# Patient Record
Sex: Female | Born: 1935 | Race: White | Hispanic: No | Smoking: Never smoker
Health system: Southern US, Community
[De-identification: ages and names within clinical notes are randomized; demographics above are authoritative.]

## PROBLEM LIST (undated history)

## (undated) DIAGNOSIS — IMO0001 Reserved for inherently not codable concepts without codable children: Secondary | ICD-10-CM

## (undated) DIAGNOSIS — H269 Unspecified cataract: Secondary | ICD-10-CM

## (undated) DIAGNOSIS — I82409 Acute embolism and thrombosis of unspecified deep veins of unspecified lower extremity: Secondary | ICD-10-CM

## (undated) DIAGNOSIS — N390 Urinary tract infection, site not specified: Secondary | ICD-10-CM

## (undated) DIAGNOSIS — Z96649 Presence of unspecified artificial hip joint: Secondary | ICD-10-CM

## (undated) DIAGNOSIS — M1990 Unspecified osteoarthritis, unspecified site: Secondary | ICD-10-CM

## (undated) DIAGNOSIS — J342 Deviated nasal septum: Secondary | ICD-10-CM

## (undated) DIAGNOSIS — H538 Other visual disturbances: Secondary | ICD-10-CM

## (undated) DIAGNOSIS — M47812 Spondylosis without myelopathy or radiculopathy, cervical region: Secondary | ICD-10-CM

## (undated) DIAGNOSIS — J309 Allergic rhinitis, unspecified: Secondary | ICD-10-CM

## (undated) DIAGNOSIS — M545 Low back pain: Secondary | ICD-10-CM

## (undated) DIAGNOSIS — I679 Cerebrovascular disease, unspecified: Secondary | ICD-10-CM

## (undated) DIAGNOSIS — E039 Hypothyroidism, unspecified: Secondary | ICD-10-CM

## (undated) DIAGNOSIS — H6980 Other specified disorders of Eustachian tube, unspecified ear: Secondary | ICD-10-CM

## (undated) DIAGNOSIS — I639 Cerebral infarction, unspecified: Secondary | ICD-10-CM

## (undated) DIAGNOSIS — F418 Other specified anxiety disorders: Secondary | ICD-10-CM

## (undated) DIAGNOSIS — E785 Hyperlipidemia, unspecified: Secondary | ICD-10-CM

## (undated) DIAGNOSIS — J329 Chronic sinusitis, unspecified: Secondary | ICD-10-CM

## (undated) DIAGNOSIS — Z87898 Personal history of other specified conditions: Secondary | ICD-10-CM

## (undated) DIAGNOSIS — H6990 Unspecified Eustachian tube disorder, unspecified ear: Secondary | ICD-10-CM

## (undated) DIAGNOSIS — E871 Hypo-osmolality and hyponatremia: Secondary | ICD-10-CM

## (undated) DIAGNOSIS — F419 Anxiety disorder, unspecified: Secondary | ICD-10-CM

## (undated) DIAGNOSIS — I10 Essential (primary) hypertension: Secondary | ICD-10-CM

## (undated) DIAGNOSIS — R3911 Hesitancy of micturition: Secondary | ICD-10-CM

## (undated) DIAGNOSIS — I82412 Acute embolism and thrombosis of left femoral vein: Secondary | ICD-10-CM

## (undated) DIAGNOSIS — G319 Degenerative disease of nervous system, unspecified: Secondary | ICD-10-CM

## (undated) DIAGNOSIS — T84029A Dislocation of unspecified internal joint prosthesis, initial encounter: Secondary | ICD-10-CM

## (undated) DIAGNOSIS — M810 Age-related osteoporosis without current pathological fracture: Secondary | ICD-10-CM

## (undated) DIAGNOSIS — L299 Pruritus, unspecified: Secondary | ICD-10-CM

## (undated) HISTORY — DX: Spondylosis without myelopathy or radiculopathy, cervical region: M47.812

## (undated) HISTORY — DX: Hypothyroidism, unspecified: E03.9

## (undated) HISTORY — DX: Other visual disturbances: H53.8

## (undated) HISTORY — DX: Urinary tract infection, site not specified: N39.0

## (undated) HISTORY — DX: Anxiety disorder, unspecified: F41.9

## (undated) HISTORY — DX: Deviated nasal septum: J34.2

## (undated) HISTORY — DX: Cerebral infarction, unspecified: I63.9

## (undated) HISTORY — DX: Acute embolism and thrombosis of unspecified deep veins of unspecified lower extremity: I82.409

## (undated) HISTORY — DX: Other specified anxiety disorders: F41.8

## (undated) HISTORY — DX: Hypo-osmolality and hyponatremia: E87.1

## (undated) HISTORY — DX: Low back pain: M54.5

## (undated) HISTORY — DX: Degenerative disease of nervous system, unspecified: G31.9

## (undated) HISTORY — DX: Chronic sinusitis, unspecified: J32.9

## (undated) HISTORY — DX: Allergic rhinitis, unspecified: J30.9

## (undated) HISTORY — DX: Dislocation of unspecified internal joint prosthesis, initial encounter: T84.029A

## (undated) HISTORY — DX: Unspecified cataract: H26.9

## (undated) HISTORY — DX: Hesitancy of micturition: R39.11

## (undated) HISTORY — DX: Hyperlipidemia, unspecified: E78.5

## (undated) HISTORY — DX: Personal history of other specified conditions: Z87.898

## (undated) HISTORY — DX: Reserved for inherently not codable concepts without codable children: IMO0001

## (undated) HISTORY — DX: Unspecified eustachian tube disorder, unspecified ear: H69.90

## (undated) HISTORY — PX: TONSILLECTOMY: SUR1361

## (undated) HISTORY — PX: TOTAL HIP ARTHROPLASTY: SHX124

## (undated) HISTORY — DX: Age-related osteoporosis without current pathological fracture: M81.0

## (undated) HISTORY — DX: Presence of unspecified artificial hip joint: Z96.649

## (undated) HISTORY — DX: Acute embolism and thrombosis of left femoral vein: I82.412

## (undated) HISTORY — DX: Pruritus, unspecified: L29.9

## (undated) HISTORY — DX: Unspecified osteoarthritis, unspecified site: M19.90

## (undated) HISTORY — PX: JOINT REPLACEMENT: SHX530

## (undated) HISTORY — DX: Essential (primary) hypertension: I10

## (undated) HISTORY — DX: Cerebrovascular disease, unspecified: I67.9

## (undated) HISTORY — PX: OTHER SURGICAL HISTORY: SHX169

## (undated) HISTORY — DX: Other specified disorders of Eustachian tube, unspecified ear: H69.80

---

## 1998-09-25 ENCOUNTER — Encounter: Payer: Self-pay | Admitting: Internal Medicine

## 1998-09-25 ENCOUNTER — Ambulatory Visit (HOSPITAL_COMMUNITY): Admission: RE | Admit: 1998-09-25 | Discharge: 1998-09-25 | Payer: Self-pay | Admitting: Internal Medicine

## 1998-10-13 ENCOUNTER — Other Ambulatory Visit: Admission: RE | Admit: 1998-10-13 | Discharge: 1998-10-13 | Payer: Self-pay | Admitting: Internal Medicine

## 1999-07-19 ENCOUNTER — Ambulatory Visit (HOSPITAL_COMMUNITY): Admission: RE | Admit: 1999-07-19 | Discharge: 1999-07-19 | Payer: Self-pay | Admitting: *Deleted

## 1999-10-29 ENCOUNTER — Other Ambulatory Visit: Admission: RE | Admit: 1999-10-29 | Discharge: 1999-10-29 | Payer: Self-pay | Admitting: Internal Medicine

## 1999-11-15 ENCOUNTER — Encounter: Payer: Self-pay | Admitting: Internal Medicine

## 1999-11-15 ENCOUNTER — Ambulatory Visit (HOSPITAL_COMMUNITY): Admission: RE | Admit: 1999-11-15 | Discharge: 1999-11-15 | Payer: Self-pay

## 2000-11-02 ENCOUNTER — Other Ambulatory Visit: Admission: RE | Admit: 2000-11-02 | Discharge: 2000-11-02 | Payer: Self-pay | Admitting: Internal Medicine

## 2000-11-29 ENCOUNTER — Encounter: Payer: Self-pay | Admitting: Internal Medicine

## 2000-11-29 ENCOUNTER — Ambulatory Visit (HOSPITAL_COMMUNITY): Admission: RE | Admit: 2000-11-29 | Discharge: 2000-11-29 | Payer: Self-pay | Admitting: Internal Medicine

## 2001-04-05 ENCOUNTER — Encounter: Payer: Self-pay | Admitting: Orthopedic Surgery

## 2001-04-12 ENCOUNTER — Inpatient Hospital Stay (HOSPITAL_COMMUNITY): Admission: RE | Admit: 2001-04-12 | Discharge: 2001-04-16 | Payer: Self-pay | Admitting: Orthopedic Surgery

## 2001-04-12 ENCOUNTER — Encounter: Payer: Self-pay | Admitting: Orthopedic Surgery

## 2001-04-16 ENCOUNTER — Inpatient Hospital Stay (HOSPITAL_COMMUNITY)
Admission: RE | Admit: 2001-04-16 | Discharge: 2001-04-24 | Payer: Self-pay | Admitting: Physical Medicine & Rehabilitation

## 2001-11-05 ENCOUNTER — Other Ambulatory Visit: Admission: RE | Admit: 2001-11-05 | Discharge: 2001-11-05 | Payer: Self-pay | Admitting: Internal Medicine

## 2001-12-06 ENCOUNTER — Ambulatory Visit (HOSPITAL_COMMUNITY): Admission: RE | Admit: 2001-12-06 | Discharge: 2001-12-06 | Payer: Self-pay | Admitting: Internal Medicine

## 2001-12-06 ENCOUNTER — Encounter: Payer: Self-pay | Admitting: Internal Medicine

## 2002-06-06 ENCOUNTER — Encounter: Payer: Self-pay | Admitting: Orthopedic Surgery

## 2002-06-10 ENCOUNTER — Encounter: Payer: Self-pay | Admitting: Orthopedic Surgery

## 2002-06-10 ENCOUNTER — Inpatient Hospital Stay (HOSPITAL_COMMUNITY): Admission: RE | Admit: 2002-06-10 | Discharge: 2002-06-13 | Payer: Self-pay | Admitting: Orthopedic Surgery

## 2002-06-13 ENCOUNTER — Inpatient Hospital Stay (HOSPITAL_COMMUNITY)
Admission: RE | Admit: 2002-06-13 | Discharge: 2002-06-19 | Payer: Self-pay | Admitting: Physical Medicine & Rehabilitation

## 2002-11-29 ENCOUNTER — Other Ambulatory Visit: Admission: RE | Admit: 2002-11-29 | Discharge: 2002-11-29 | Payer: Self-pay | Admitting: Internal Medicine

## 2002-12-16 ENCOUNTER — Ambulatory Visit (HOSPITAL_COMMUNITY): Admission: RE | Admit: 2002-12-16 | Discharge: 2002-12-16 | Payer: Self-pay | Admitting: Internal Medicine

## 2002-12-16 ENCOUNTER — Encounter: Payer: Self-pay | Admitting: Internal Medicine

## 2003-08-21 ENCOUNTER — Encounter: Admission: RE | Admit: 2003-08-21 | Discharge: 2003-08-21 | Payer: Self-pay | Admitting: Orthopedic Surgery

## 2003-12-18 ENCOUNTER — Encounter: Admission: RE | Admit: 2003-12-18 | Discharge: 2003-12-18 | Payer: Self-pay | Admitting: Internal Medicine

## 2004-01-06 ENCOUNTER — Ambulatory Visit (HOSPITAL_COMMUNITY): Admission: RE | Admit: 2004-01-06 | Discharge: 2004-01-06 | Payer: Self-pay | Admitting: Internal Medicine

## 2004-11-16 ENCOUNTER — Ambulatory Visit: Payer: Self-pay | Admitting: Internal Medicine

## 2004-11-27 ENCOUNTER — Observation Stay (HOSPITAL_COMMUNITY): Admission: EM | Admit: 2004-11-27 | Discharge: 2004-11-28 | Payer: Self-pay | Admitting: Emergency Medicine

## 2005-01-06 ENCOUNTER — Ambulatory Visit (HOSPITAL_COMMUNITY): Admission: RE | Admit: 2005-01-06 | Discharge: 2005-01-06 | Payer: Self-pay | Admitting: Internal Medicine

## 2005-03-16 ENCOUNTER — Ambulatory Visit: Payer: Self-pay | Admitting: Internal Medicine

## 2005-04-15 ENCOUNTER — Ambulatory Visit: Payer: Self-pay | Admitting: Internal Medicine

## 2005-05-19 ENCOUNTER — Ambulatory Visit: Payer: Self-pay | Admitting: Internal Medicine

## 2005-05-20 ENCOUNTER — Ambulatory Visit: Payer: Self-pay | Admitting: Internal Medicine

## 2005-05-23 ENCOUNTER — Ambulatory Visit: Payer: Self-pay | Admitting: Internal Medicine

## 2005-05-26 ENCOUNTER — Ambulatory Visit: Payer: Self-pay | Admitting: Internal Medicine

## 2005-05-30 ENCOUNTER — Ambulatory Visit: Payer: Self-pay | Admitting: Internal Medicine

## 2005-06-06 ENCOUNTER — Ambulatory Visit: Payer: Self-pay | Admitting: Internal Medicine

## 2005-06-09 ENCOUNTER — Ambulatory Visit: Payer: Self-pay | Admitting: Internal Medicine

## 2005-06-14 ENCOUNTER — Ambulatory Visit: Payer: Self-pay | Admitting: Internal Medicine

## 2005-06-17 ENCOUNTER — Ambulatory Visit: Payer: Self-pay | Admitting: Internal Medicine

## 2005-06-21 ENCOUNTER — Ambulatory Visit: Payer: Self-pay | Admitting: Internal Medicine

## 2005-07-14 ENCOUNTER — Ambulatory Visit: Payer: Self-pay | Admitting: Internal Medicine

## 2005-07-18 ENCOUNTER — Ambulatory Visit: Payer: Self-pay | Admitting: Internal Medicine

## 2005-08-12 ENCOUNTER — Ambulatory Visit: Payer: Self-pay | Admitting: Internal Medicine

## 2005-11-15 ENCOUNTER — Ambulatory Visit: Payer: Self-pay | Admitting: Internal Medicine

## 2006-01-10 ENCOUNTER — Ambulatory Visit: Payer: Self-pay | Admitting: Internal Medicine

## 2006-01-19 ENCOUNTER — Ambulatory Visit (HOSPITAL_COMMUNITY): Admission: RE | Admit: 2006-01-19 | Discharge: 2006-01-19 | Payer: Self-pay | Admitting: Internal Medicine

## 2006-03-29 ENCOUNTER — Ambulatory Visit: Payer: Self-pay | Admitting: Internal Medicine

## 2006-04-10 ENCOUNTER — Ambulatory Visit: Payer: Self-pay | Admitting: Internal Medicine

## 2006-06-26 ENCOUNTER — Ambulatory Visit: Payer: Self-pay | Admitting: Pulmonary Disease

## 2006-07-14 ENCOUNTER — Encounter: Admission: RE | Admit: 2006-07-14 | Discharge: 2006-07-14 | Payer: Self-pay | Admitting: Internal Medicine

## 2006-07-18 ENCOUNTER — Ambulatory Visit: Payer: Self-pay | Admitting: Internal Medicine

## 2006-09-18 ENCOUNTER — Ambulatory Visit: Payer: Self-pay | Admitting: Internal Medicine

## 2006-11-10 ENCOUNTER — Emergency Department (HOSPITAL_COMMUNITY): Admission: EM | Admit: 2006-11-10 | Discharge: 2006-11-10 | Payer: Self-pay | Admitting: Emergency Medicine

## 2006-12-26 ENCOUNTER — Ambulatory Visit: Payer: Self-pay | Admitting: Internal Medicine

## 2007-01-22 ENCOUNTER — Ambulatory Visit (HOSPITAL_COMMUNITY): Admission: RE | Admit: 2007-01-22 | Discharge: 2007-01-22 | Payer: Self-pay | Admitting: Internal Medicine

## 2007-03-19 ENCOUNTER — Ambulatory Visit: Payer: Self-pay | Admitting: Internal Medicine

## 2007-03-23 ENCOUNTER — Ambulatory Visit (HOSPITAL_COMMUNITY): Admission: RE | Admit: 2007-03-23 | Discharge: 2007-03-23 | Payer: Self-pay | Admitting: *Deleted

## 2007-08-11 DIAGNOSIS — J329 Chronic sinusitis, unspecified: Secondary | ICD-10-CM | POA: Insufficient documentation

## 2007-08-11 DIAGNOSIS — H6980 Other specified disorders of Eustachian tube, unspecified ear: Secondary | ICD-10-CM

## 2007-08-11 DIAGNOSIS — J310 Chronic rhinitis: Secondary | ICD-10-CM | POA: Insufficient documentation

## 2007-08-11 DIAGNOSIS — J342 Deviated nasal septum: Secondary | ICD-10-CM

## 2007-08-14 ENCOUNTER — Ambulatory Visit: Payer: Self-pay | Admitting: Internal Medicine

## 2007-09-19 ENCOUNTER — Telehealth: Payer: Self-pay | Admitting: Internal Medicine

## 2007-09-26 ENCOUNTER — Telehealth (INDEPENDENT_AMBULATORY_CARE_PROVIDER_SITE_OTHER): Payer: Self-pay | Admitting: *Deleted

## 2007-09-30 IMAGING — CT CT HEAD W/O CM
1 of 2 series · 16 of 30 positions shown, 20 images · IV contrast (agent unspecified)
Comparison: None.

CLINICAL DATA: 85-year-old, fell.  
 HEAD CT WITHOUT CONTRAST:
TECHNIQUE: Contiguous axial images were obtained from the base of the skull through the vertex according to standard protocol without contrast.

[Series 2: head trauma 4.8 h47s · axial · 0.47mm/px · z∈[-91,+37]mm · 16 of 30 slices shown, 20 images]
[im 2/30  brain]
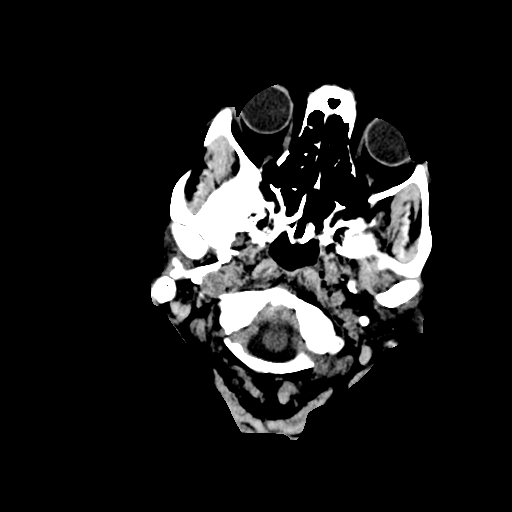
[im 2/30  bone]
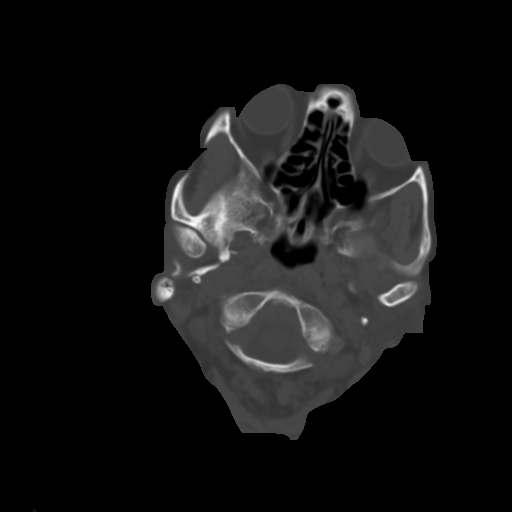
[im 3/30  brain]
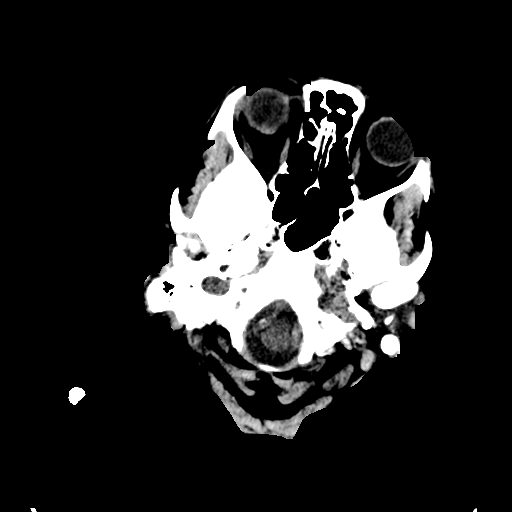
[im 5/30  brain]
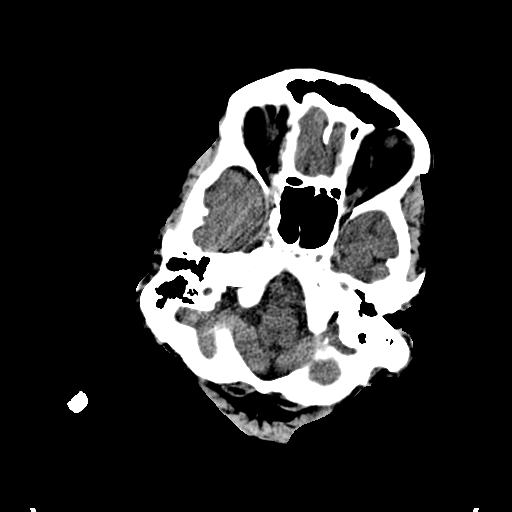
[im 8/30  brain]
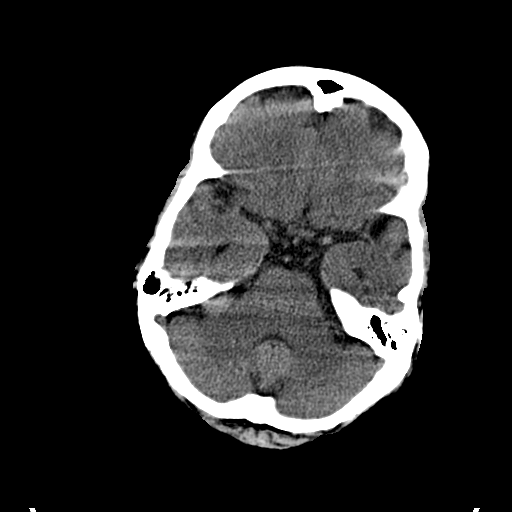
[im 9/30  brain]
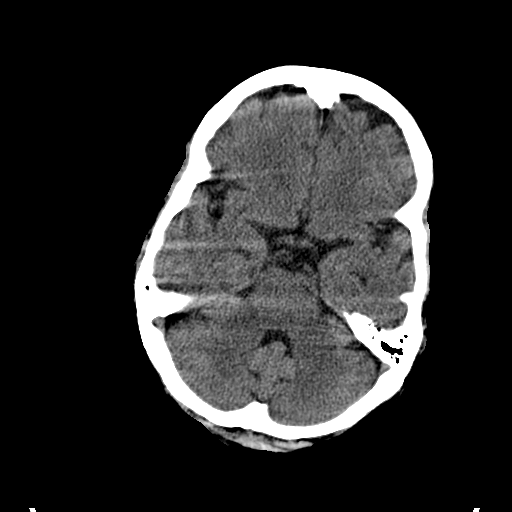
[im 9/30  bone]
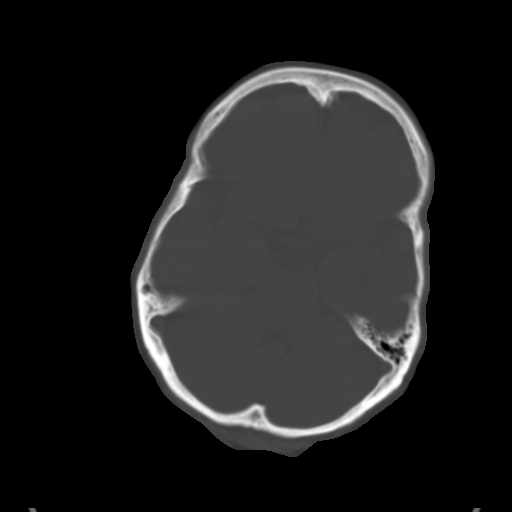
[im 11/30  brain]
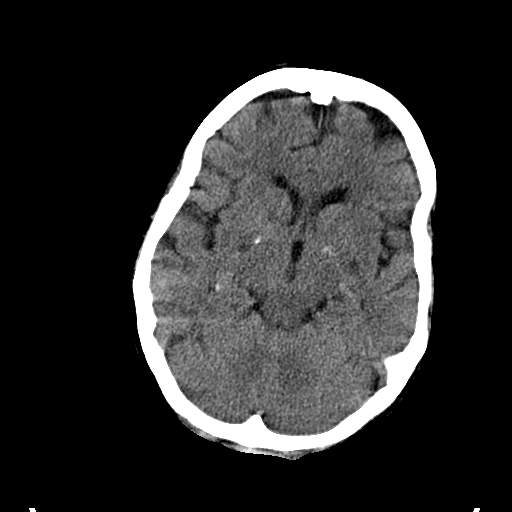
[im 12/30  brain]
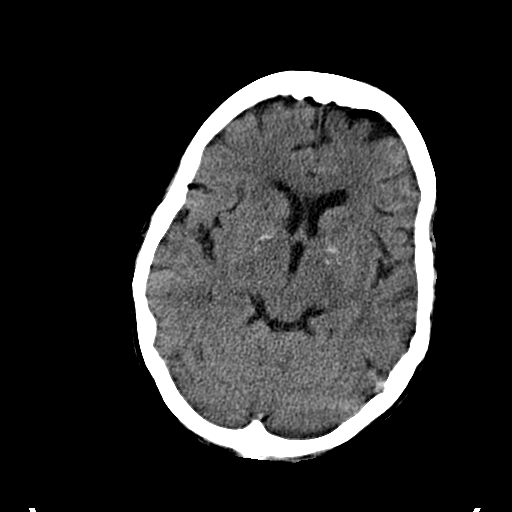
[im 14/30  brain]
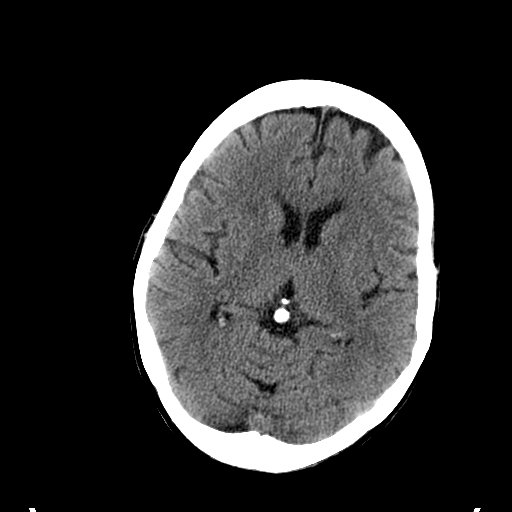
[im 16/30  brain]
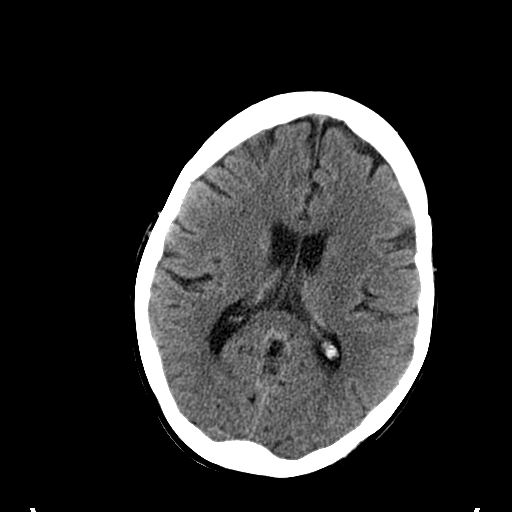
[im 16/30  bone]
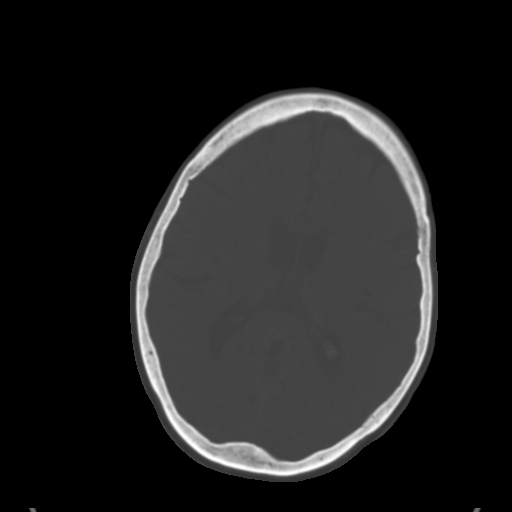
[im 18/30  brain]
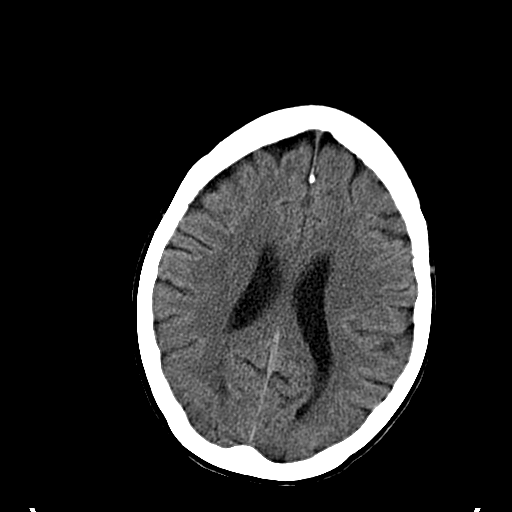
[im 19/30  brain]
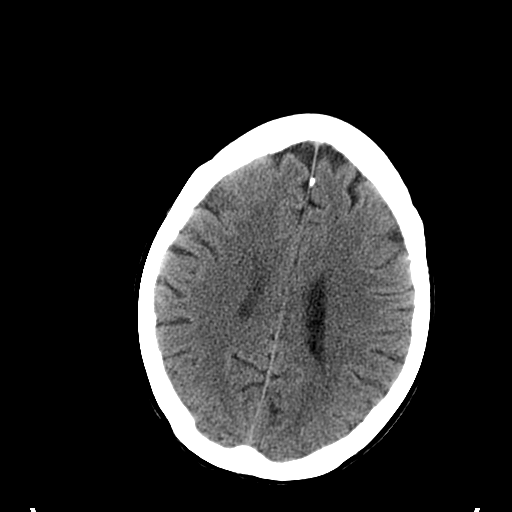
[im 21/30  brain]
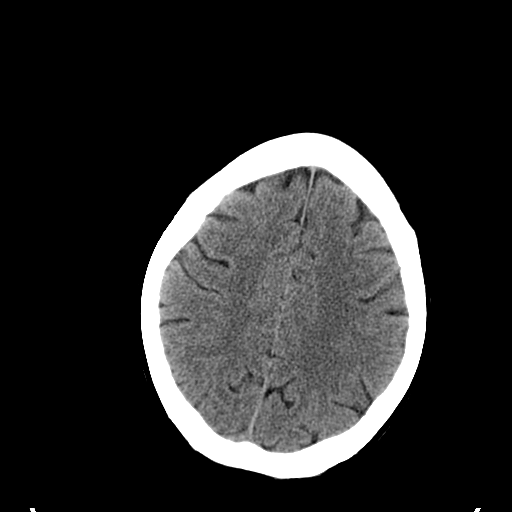
[im 22/30  brain]
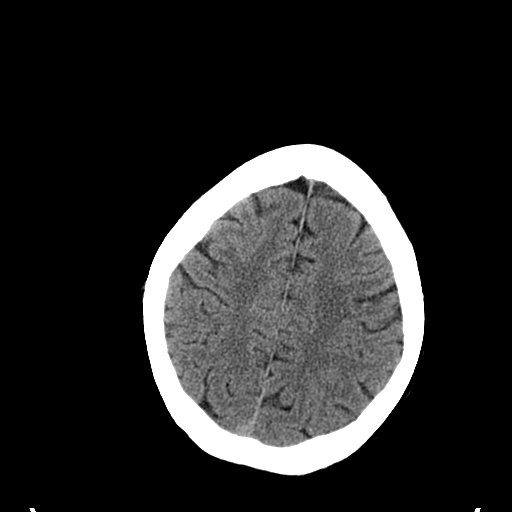
[im 22/30  bone]
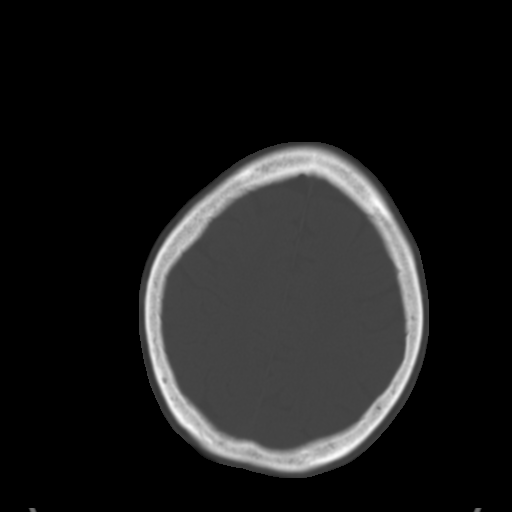
[im 25/30  brain]
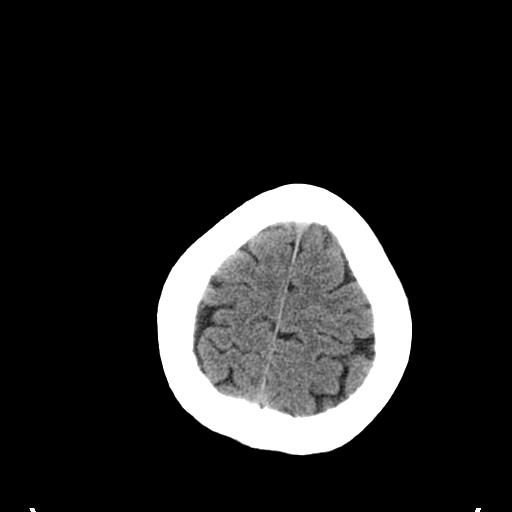
[im 27/30  brain]
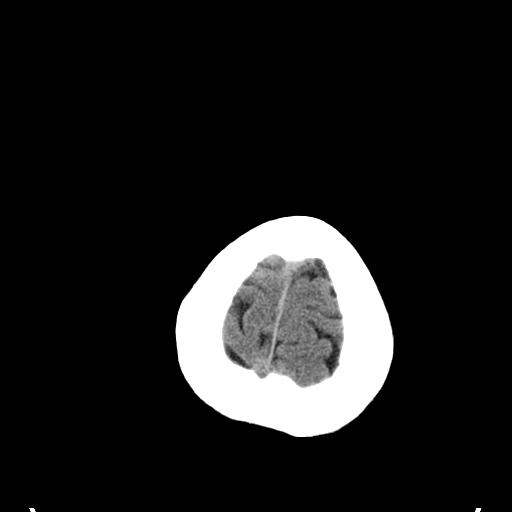
[im 28/30  brain]
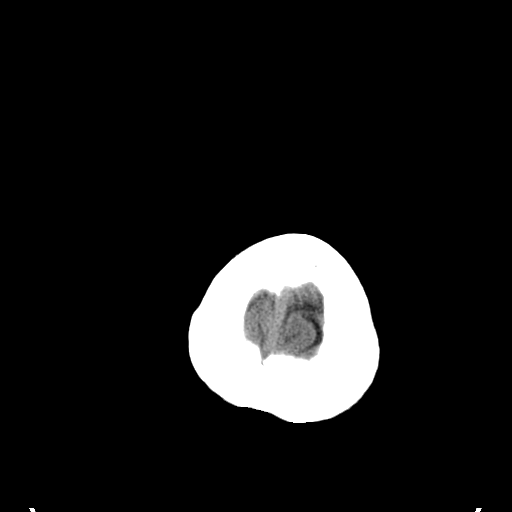

[16 of 30 positions shown; findings below may reference images not displayed]

FINDINGS: Intracranially, the ventricles are in the midline without mass effect or shift.  They are normal in size and configuration.  No extra-axial fluid collections are seen.  Benign appearing bilateral basal ganglia calcifications and remote lacunar type infarcts in the caudate areas.  No evidence for hemispheric infarction, intracranial hemorrhage, or subdural hematoma.  No intracranial mass lesions.  
 The bony calvarium is intact.  The visualized paranasal sinuses and mastoid air cells are clear.
IMPRESSION: No acute cardiopulmonary findings.

## 2007-09-30 IMAGING — CR DG HIP (WITH OR WITHOUT PELVIS) 2-3V*L*
2 series · 2 of 2 positions shown · non-contrast
Comparison: none

CLINICAL DATA: 85-year-old who fell.  Left hip dislocation.  
 LEFT HIP ? 2 VIEW:

[t pelvis a.p.]
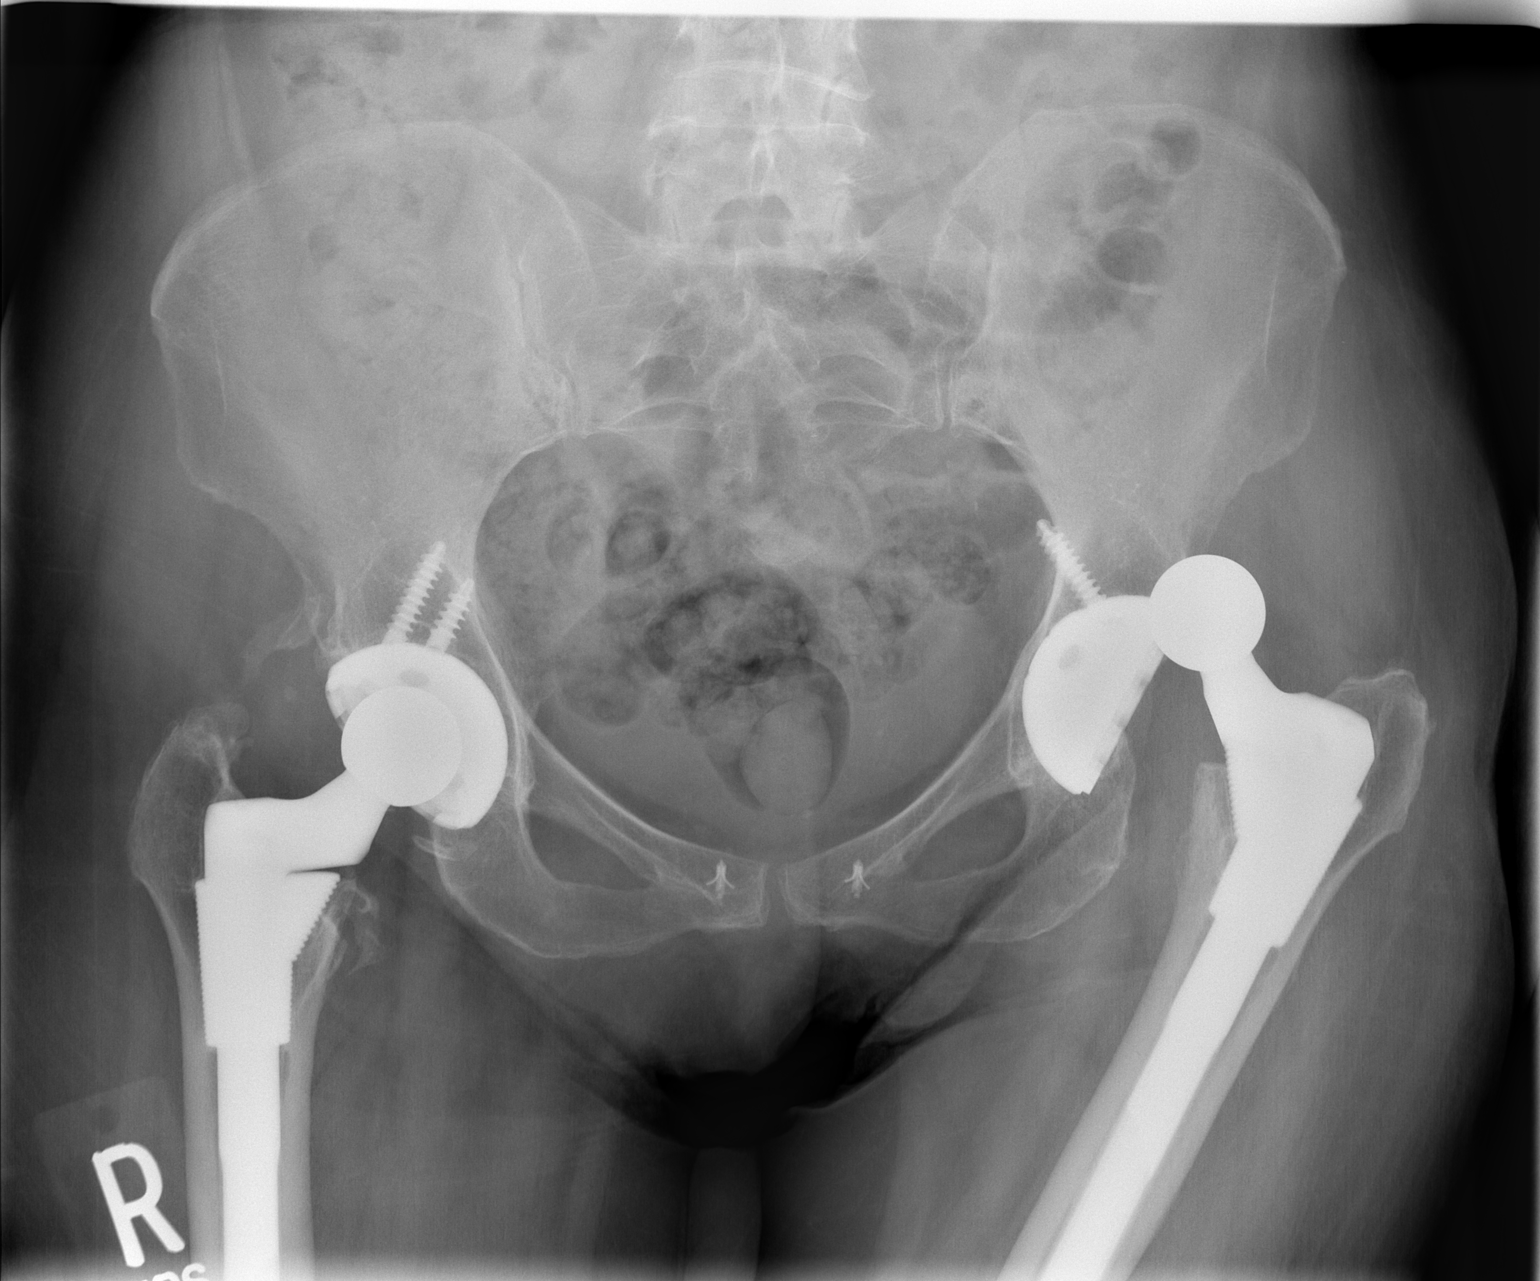

[t hip ap left]
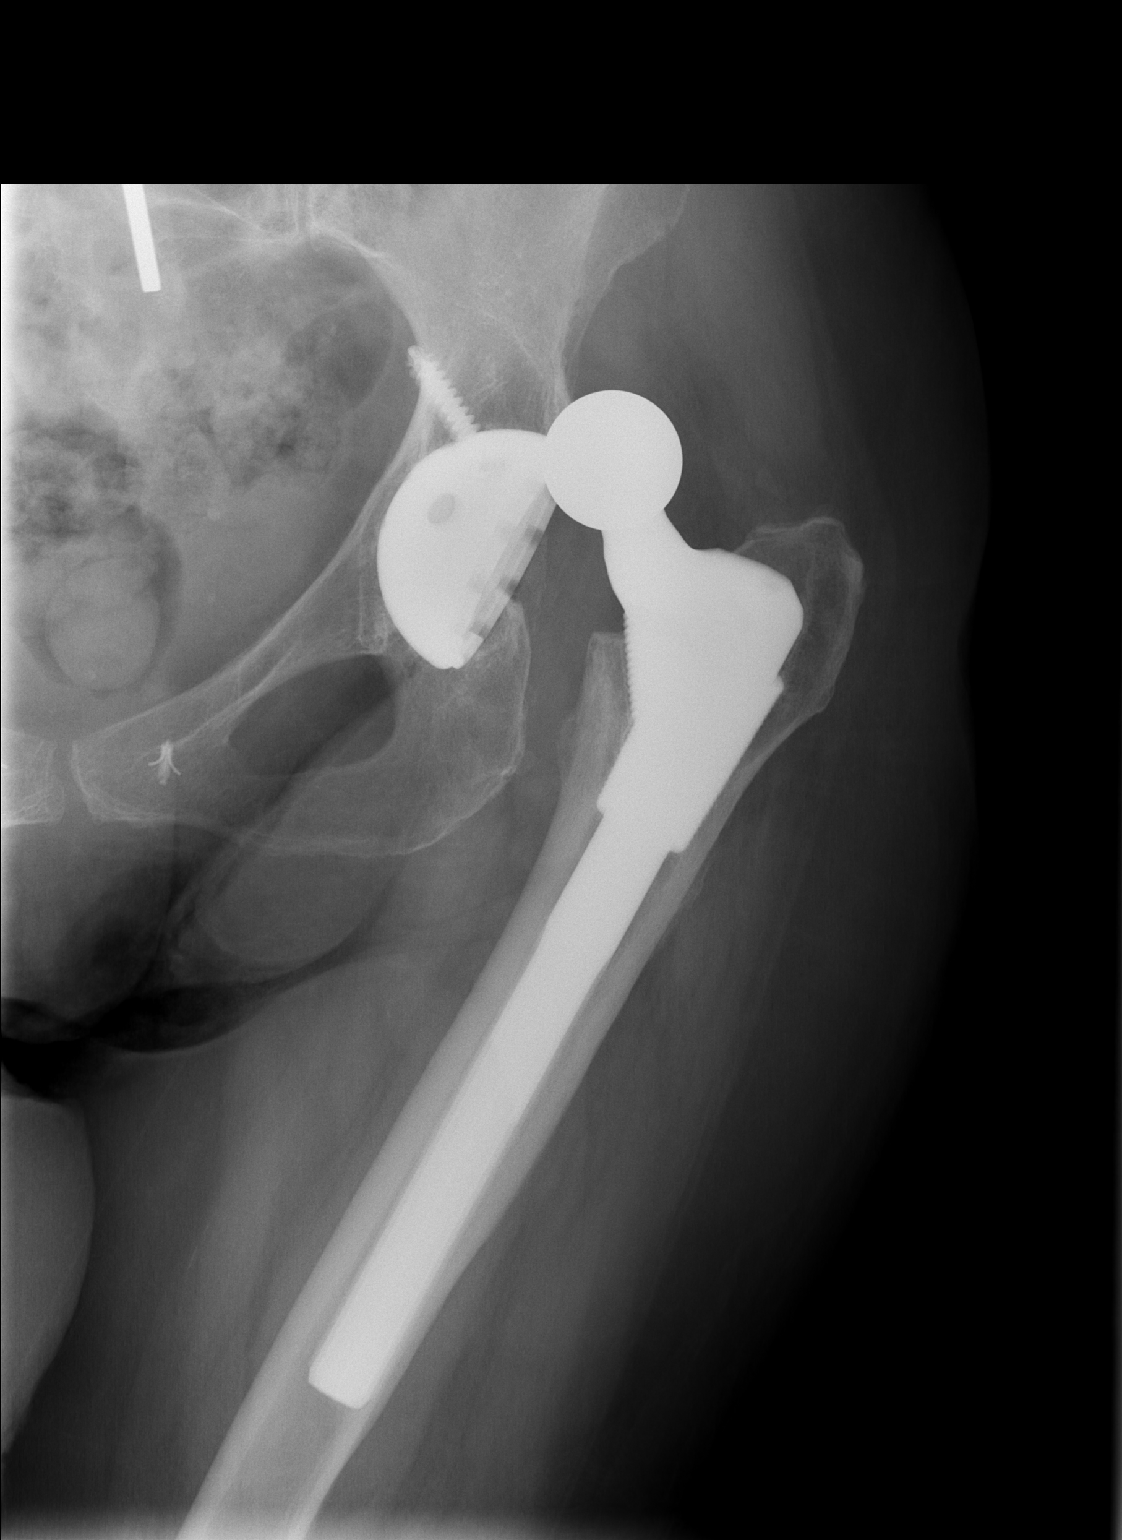

[2 of 2 positions shown; findings below may reference images not displayed]

FINDINGS: The left femoral prosthesis is dislocated superiorly and posteriorly.  No fractures are seen.
IMPRESSION: Left femoral prosthesis dislocation.

## 2007-12-11 ENCOUNTER — Encounter: Payer: Self-pay | Admitting: Internal Medicine

## 2008-02-05 ENCOUNTER — Encounter: Admission: RE | Admit: 2008-02-05 | Discharge: 2008-02-05 | Payer: Self-pay | Admitting: Internal Medicine

## 2008-02-21 ENCOUNTER — Encounter: Admission: RE | Admit: 2008-02-21 | Discharge: 2008-02-21 | Payer: Self-pay | Admitting: Internal Medicine

## 2008-02-21 ENCOUNTER — Encounter (INDEPENDENT_AMBULATORY_CARE_PROVIDER_SITE_OTHER): Payer: Self-pay | Admitting: Interventional Radiology

## 2008-02-21 ENCOUNTER — Other Ambulatory Visit: Admission: RE | Admit: 2008-02-21 | Discharge: 2008-02-21 | Payer: Self-pay | Admitting: Interventional Radiology

## 2008-03-20 ENCOUNTER — Ambulatory Visit (HOSPITAL_COMMUNITY): Admission: RE | Admit: 2008-03-20 | Discharge: 2008-03-20 | Payer: Self-pay | Admitting: Internal Medicine

## 2008-04-11 ENCOUNTER — Telehealth (INDEPENDENT_AMBULATORY_CARE_PROVIDER_SITE_OTHER): Payer: Self-pay | Admitting: *Deleted

## 2008-05-12 ENCOUNTER — Telehealth (INDEPENDENT_AMBULATORY_CARE_PROVIDER_SITE_OTHER): Payer: Self-pay | Admitting: *Deleted

## 2008-06-05 ENCOUNTER — Ambulatory Visit: Payer: Self-pay | Admitting: Internal Medicine

## 2008-07-11 ENCOUNTER — Telehealth (INDEPENDENT_AMBULATORY_CARE_PROVIDER_SITE_OTHER): Payer: Self-pay | Admitting: *Deleted

## 2008-08-26 ENCOUNTER — Telehealth (INDEPENDENT_AMBULATORY_CARE_PROVIDER_SITE_OTHER): Payer: Self-pay | Admitting: *Deleted

## 2008-09-15 ENCOUNTER — Telehealth (INDEPENDENT_AMBULATORY_CARE_PROVIDER_SITE_OTHER): Payer: Self-pay | Admitting: *Deleted

## 2009-03-31 ENCOUNTER — Ambulatory Visit (HOSPITAL_COMMUNITY): Admission: RE | Admit: 2009-03-31 | Discharge: 2009-03-31 | Payer: Self-pay | Admitting: Unknown Physician Specialty

## 2009-05-27 ENCOUNTER — Telehealth: Payer: Self-pay | Admitting: Internal Medicine

## 2009-06-05 ENCOUNTER — Ambulatory Visit: Payer: Self-pay | Admitting: Internal Medicine

## 2009-06-05 DIAGNOSIS — N390 Urinary tract infection, site not specified: Secondary | ICD-10-CM

## 2009-06-05 LAB — CONVERTED CEMR LAB
Specific Gravity, Urine: >=1.03 (ref 1.000–1.030)
Total Protein, Urine: 30 mg/dL
Urine Glucose: NEGATIVE mg/dL

## 2009-06-06 ENCOUNTER — Encounter: Payer: Self-pay | Admitting: Internal Medicine

## 2009-06-08 ENCOUNTER — Telehealth: Payer: Self-pay | Admitting: Internal Medicine

## 2009-07-17 HISTORY — PX: LOOP RECORDER IMPLANT: SHX5954

## 2009-12-14 ENCOUNTER — Telehealth (INDEPENDENT_AMBULATORY_CARE_PROVIDER_SITE_OTHER): Payer: Self-pay | Admitting: *Deleted

## 2010-04-09 ENCOUNTER — Ambulatory Visit (HOSPITAL_COMMUNITY): Admission: RE | Admit: 2010-04-09 | Discharge: 2010-04-09 | Payer: Self-pay | Admitting: Internal Medicine

## 2010-06-25 ENCOUNTER — Ambulatory Visit: Payer: Self-pay | Admitting: Internal Medicine

## 2010-10-28 ENCOUNTER — Telehealth (INDEPENDENT_AMBULATORY_CARE_PROVIDER_SITE_OTHER): Payer: Self-pay | Admitting: *Deleted

## 2010-11-02 ENCOUNTER — Encounter: Payer: Self-pay | Admitting: Internal Medicine

## 2010-11-07 ENCOUNTER — Encounter: Payer: Self-pay | Admitting: Internal Medicine

## 2010-11-18 NOTE — Assessment & Plan Note (Signed)
Summary: 12 months/apc   Primary Quiara Killian/Referring Jaelee Laughter:  Pearson Grippe  CC:  Yearly follow up visit-sneezing, runny nose, and ears feel full and Left ear has sharp pains at times..  History of Present Illness: CC:  yearly follow up-allergies.. History of Present Illness: 21-Jun-2008- 77 year old woman returning for follow-up of allergic rhinitis with eustachian dysfunction and chronic sinusitis.  Complains a full feeling in head and ears.  Little chest discomfort.  In the morning watery rhinorrhea is clear.  Sometimes while sitting, she will get clammy, hot, and feel faint.  Workup for this was negative.  Spells are self-limited. Ipratropium nasal spray helps the rhinorrhea.  We discussed meclizine.  Denies chest pain, palpitations or reflux, nausea or vomiting, peripheral edema.  Jun 21, 2009- Allergic rhinitis, eustachian dysfunction, chronic sinusitis All summer clear drainage and sneeze esp in AM, achey pressure feeling. Ears feel stopped up. The nasal drainage has been more seasonal/ summer. Ipratropium helps rhinorhea from food, used once daiily.  Incidental concern she may have UTI- has not mentioned to Dr Selena Batten and has not seen her urologist in a long time, but she asks we order a urine culture. i agreed as a Agricultural engineer / courtesy while she was here.  June 25, 2010- Allergic rhinitis, eustachian dysfunction, chronic sinusitis Last visit we had her using Neti pot, nasalcrom, sudafed and we gave Cipro. C/O full pressure in head/ ears, achey bitemporal, dizzy some days nose will drain. Ipratropium does help nose drip at mealtimes. Some mornings will sneeze. Very vague and nonspecific descriptions. She has trouble isolating symptoms, just has vague mailaise. [facility]. She asked about meclizine, wanting higher dose but admitting she stays sleepy enough already. These are the same chronic discomforts. She feels that cortisone really helps and we discussed steroid side  effects. Continues flonase.  Preventive Screening-Counseling & Management  Alcohol-Tobacco     Smoking Status: never  Current Medications (verified): 1)  Synthroid 75 Mcg  Tabs (Levothyroxine Sodium) .... Take 1 By Mouth Once Daily 2)  Lorazepam 1 Mg  Tabs (Lorazepam) .... Take 1 By Mouth Once Daily As Needed 3)  Flonase 50 Mcg/act  Susp (Fluticasone Propionate) 4)  Meclizine Hcl 25 Mg  Tabs (Meclizine Hcl) .... 1/2 Tab As Needed For Dizziness 5)  Ipratropium Bromide 0.06 %  Soln (Ipratropium Bromide) .... Nasal Spray- 1 Spray Each Nostri L.bid As Needed  1-2 Sprays Each Nostril Two Times A Day As Needed 6)  Vitamin D 2000 Unit  Tabs (Cholecalciferol) .... Take 1 By Mouth Once Daily 7)  Aspirin Adult Low Strength 81 Mg  Tbec (Aspirin) .... Take 1 By Mouth Once Daily 8)  Eql Fish Oil 1000 Mg  Caps (Omega-3 Fatty Acids) .... Take 1 By Mouth Once Daily 9)  Trimethoprim 100 Mg Tabs (Trimethoprim) .... Take 1 By Mouth Once Daily 10)  Aleve 220 Mg Tabs (Naproxen Sodium) .... Take As Directed As Needed 11)  Losartan Potassium 25 Mg Tabs (Losartan Potassium) .... Take 1 By Mouth Once Daily 12)  Burlams Patch .... 10mg  Patch  Allergies (verified): No Known Drug Allergies  Past History:  Past Medical History: Last updated: 2009-06-21 DEVIATED SEPTUM (ICD-470) EUSTACHIAN TUBE DYSFUNCTION (ICD-381.81) SINUSITIS, CHRONIC (ICD-473.9) ALLERGIC RHINITIS (ICD-477.9)  Past Surgical History: Last updated: 21-Jun-2009 Tonsillectomy bilateral hip replacement pelvic sling  Family History: Last updated: June 21, 2009 Father- died cancer Mother- died blood dyscrasia  Social History: Last updated: 06-21-09 Patient never smoked.  Widowed  Risk Factors: Smoking Status: never (06/25/2010)  Review of Systems      See HPI       The patient complains of headaches, nasal congestion/difficulty breathing through nose, and sneezing.  The patient denies shortness of breath with activity, shortness  of breath at rest, productive cough, non-productive cough, coughing up blood, chest pain, irregular heartbeats, acid heartburn, indigestion, loss of appetite, weight change, abdominal pain, difficulty swallowing, sore throat, and tooth/dental problems.    Vital Signs:  Patient profile:   77 year old female Height:      64 inches Weight:      150.50 pounds BMI:     25.93 O2 Sat:      95 % on Room air Pulse rate:   68 / minute BP sitting:   118 / 58  (left arm) Cuff size:   regular  Vitals Entered By: Reynaldo Minium CMA (June 25, 2010 2:20 PM)  O2 Flow:  Room air CC: Yearly follow up visit-sneezing,runny nose, ears feel full and Left ear has sharp pains at times.   Physical Exam  Additional Exam:  General: A/Ox3; pleasant and cooperative, NAD, little old lady, alert and oriented SKIN: no rash, lesions NODES: no lymphadenopathy HEENT: Cerumen non obstructing NECK: Supple w/ fair ROM, JVD- none, normal carotid impulses w/o bruits Thyroid-  CHEST: Clear to P&A HEART: RRR, no m/g/r heard ABDOMEN: Soft OZH:YQMV, nl pulses, no edema  NEURO: Grossly intact to observation. No nystagmus. Walking cane. HEENT: [place]/AT, EOM- wnl, Conjunctivae- clear, PERRLA, TMs- wnl, Nose- clear, narrow, pale, Throat- tongue coated     Impression & Recommendations:  Problem # 1:  ALLERGIC RHINITIS (ICD-477.9)  Vague pressure/ fullness discomfort in her head seems unlikely at her age to be atopic. She does describe sneeze and runny nose. We will give nasal neb and depo inj that she feels help more than anything else. Gentle use of occasional decongestant might help. Her updated medication list for this problem includes:    Flonase 50 Mcg/act Susp (Fluticasone propionate)    Ipratropium Bromide 0.06 % Soln (Ipratropium bromide) ..... Nasal spray- 1 spray each nostri l.bid as needed  1-2 sprays each nostril two times a day as needed  Problem # 2:  DEVIATED SEPTUM (ICD-470) I discussed use of nasal  strips at least at night. We do not feel nasal septoplasty/ ENT surgery is realistic at her age.  Medications Added to Medication List This Visit: 1)  Trimethoprim 100 Mg Tabs (Trimethoprim) .... Take 1 by mouth once daily 2)  Aleve 220 Mg Tabs (Naproxen sodium) .... Take as directed as needed 3)  Losartan Potassium 25 Mg Tabs (Losartan potassium) .... Take 1 by mouth once daily 4)  Burlams Patch  .... 10mg  patch  Other Orders: Est. Patient Level III (78469) Admin of Therapeutic Inj  intramuscular or subcutaneous (62952) Depo- Medrol 40mg  (J1030) Nebulizer Tx (84132)  Patient Instructions: 1)  Please schedule a follow-up appointment in 6 months. 2)  neb neo nasal 3)  depo 40     Medication Administration  Injection # 1:    Medication: Depo- Medrol 40mg     Diagnosis: ALLERGIC RHINITIS (ICD-477.9)    Route: SQ    Site: LUOQ gluteus    Exp Date: 01/2013    Lot #: [id number]    Mfr: Pharmacia    Patient tolerated injection without complications    Given by: Reynaldo Minium CMA (June 25, 2010 4:51 PM)  Medication # 1:    Medication: EMR miscellaneous medications    Diagnosis:  ALLERGIC RHINITIS (ICD-477.9)    Dose: 3 drops    Route: intranasal    Exp Date: 04/2011    Lot #: [id number]    Mfr: bayer    Comments: Neo-Synephrine    Patient tolerated medication without complications    Given by: Reynaldo Minium CMA (June 25, 2010 4:52 PM)  Orders Added: 1)  Est. Patient Level III [14782] 2)  Admin of Therapeutic Inj  intramuscular or subcutaneous [96372] 3)  Depo- Medrol 40mg  [J1030] 4)  Nebulizer Tx [95621]

## 2010-11-18 NOTE — Progress Notes (Signed)
Summary: rx  Phone Note Call from Patient Call back at Home Phone [phone]   Caller: Patient Call For: young Reason for Call: Talk to Nurse Summary of Call: Meclizine 25mg  - need refill for 90 day supply w/refills. [facility] mail order Initial call taken by: Eugene Gavia,  December 14, 2009 11:21 AM  Follow-up for Phone Call        rx sent to [facility]--ok per cdy for #90 Follow-up by: Philipp Deputy CMA,  December 14, 2009 11:43 AM    Prescriptions: MECLIZINE HCL 25 MG  TABS (MECLIZINE HCL) 1/2 tab  #90 x 0   Entered by:   Philipp Deputy CMA   Authorized by:   Waymon Budge MD   Signed by:   Philipp Deputy CMA on 12/14/2009   Method used:   Electronically to        [facility]* (mail-order)       [address] [place], [place]  [address]       Ph: [phone]       Fax: [phone]   RxID:   [id number]

## 2010-11-18 NOTE — Progress Notes (Signed)
Summary: refill  Phone Note Call from Patient   Caller: Patient Call For: Dr. Maple Hudson Summary of Call: Patient phoned and wanted to know if she could get a refill on her Iprtropium. She would like the refill authorization sent to [facility]. Patient can be reached [phone] Initial call taken by: Vedia Coffer,  October 28, 2010 11:21 AM  Follow-up for Phone Call        [facility] that refill for Ipatropium nasal spray was sent to [facility] pharmacy. Abigail Miyamoto RN  October 28, 2010 12:45 PM     Prescriptions: IPRATROPIUM BROMIDE 0.06 %  SOLN (IPRATROPIUM BROMIDE) nasal spray- 1 spray each nostri l.bid as needed  1-2 sprays each nostril two times a day as needed  #3 x 3   Entered by:   Abigail Miyamoto RN   Authorized by:   Waymon Budge MD   Signed by:   Abigail Miyamoto RN on 10/28/2010   Method used:   Electronically to        [facility]* (mail-order)       [address] [place], [place]  [address]       Ph: [phone]       Fax: [phone]   RxID:   [id number]

## 2010-11-18 NOTE — Miscellaneous (Signed)
  Clinical Lists Changes  Medications: Changed medication from IPRATROPIUM BROMIDE 0.06 %  SOLN (IPRATROPIUM BROMIDE) nasal spray- 1 spray each nostri l.bid as needed  1-2 sprays each nostril two times a day as needed to IPRATROPIUM BROMIDE 0.06 %  SOLN (IPRATROPIUM BROMIDE) nasal spray-1-2 sprays each nostril two times a day as needed

## 2010-12-24 ENCOUNTER — Ambulatory Visit: Payer: Self-pay | Admitting: Internal Medicine

## 2011-03-01 NOTE — Assessment & Plan Note (Signed)
[facility]                             PULMONARY OFFICE NOTE   NAME:[name], [name]                   MRN:          [id number]  DATE:03/19/2007                            DOB:          Jun 02, 1922    PULMONARY OFFICE FOLLOW-UP:   PROBLEMS:  1. Allergic rhinitis.  2. Eustachian dysfunction.  3. Chronic sinusitis.   HISTORY:  Occasional spells of sneezing.  Persistent need to blow her  nose.  Watery rhinorrhea.  Dependent side of nose stops up when lying  down.  Her biggest complaint is socially annoying rhinorrhea around food  or meal times.  A CT scan last October was positive for chronic left  maxillary and bilateral diffuse ethmoid sinusitis with minor left nasal  septal deviation.  We had given her a 2-week course of Omnicef in March.  She denies headache or purulent discharge.   MEDICATIONS:  Her list is charted and reviewed.  She is no longer on  allergy vaccine or prednisone but uses Allegra-D p.r.n. and Singulair  with Flonase.   OBJECTIVE:  VITAL SIGNS:  Weight 127 pounds.  BP 122/64, pulse 55.  Room  air saturation 96%.  HEENT:  There is turbinate edema, left more obvious than the right,  without any purulent discharge.  Her throat is clear, but she is a  little husky in her voice with no stridor, no cough or wheeze.  CARDIAC:  Heart sounds are normal.   IMPRESSION:  Chronic rhinosinusitis and rhinitis with septal deviation.   PLAN:  1. Nasal saline lavage.  2. Retry ipratropium 0.06% nasal spray.  3. I have suggested that she return to [facility]      [facility].  4. We are going to watch persistence as we get past the spring pollen      season with question of additional therapy, repeat CT scan, etc.     Clinton D. Maple Hudson, MD, Tonny Bollman, FACP  Electronically Signed    CDY/MedQ  DD: 03/20/2007  DT: 03/21/2007  Job #: [id number]

## 2011-03-01 NOTE — Op Note (Signed)
NAMEAIREAL, SLATER NO.:  [id number]   MEDICAL RECORD NO.:  [id number]          PATIENT TYPE:  AMB   LOCATION:  ENDO                         FACILITY:  [facility]   PHYSICIAN:  Georgiana Spinner, M.D.    DATE OF BIRTH:  1922/07/14   DATE OF PROCEDURE:  03/23/2007  DATE OF DISCHARGE:                               OPERATIVE REPORT   PROCEDURE:  Colonoscopy.   INDICATIONS:  Colon polyps.   ANESTHESIA:  Demerol 100 mg and Versed 10 mg.   PROCEDURE:  With the patient mildly sedated in the left lateral  decubitus position, the Pentax videoscopic colonoscope was inserted in  the rectum, passed under direct vision through a diverticula filled  sigmoid colon to reach the cecum identified by ileocecal valve and  appendiceal orifice both of which were photographed.  From this point  the colonoscope was slowly withdrawn taking circumferential views of the  colonic mucosa stopping only in the rectum which appeared normal on  direct and showed hemorrhoids on retroflex view.  The endoscope was  straightened and withdrawn.  The patient's vital signs and pulse  oximeter remained stable.  The patient tolerated procedure well without  apparent complications.   FINDINGS:  Diverticulosis of the sigmoid colon.  Internal hemorrhoids.  Otherwise an unremarkable exam.   PLAN:  Repeat examination in possibly 5 years.           ______________________________  Georgiana Spinner, M.D.     GMO/MEDQ  D:  03/23/2007  T:  03/23/2007  Job:  [id number]

## 2011-03-01 NOTE — Assessment & Plan Note (Signed)
[facility]                             PULMONARY OFFICE NOTE   NAME:[name], [name]                   MRN:          [id number]  DATE:08/14/2007                            DOB:          1921/12/20    PROBLEM:  1. Allergic rhinitis.  2. Eustachian dysfunction.  3. Chronic sinusitis.   HISTORY:  With season change this fall, she has had increased head  congestion, including ears and nose, but not able to blow out anything.  CT of the sinuses in October 2007 indicated chronic sinusitis and septal  deviation, and she had been seen then by Dr. Ezzard Standing.  Ipratropium  definitely does help annoying food-related watery rhinorrhea when  needed.  Chest feels well.  Occasional dry throat-type cough.  Has had  flu shot.   MEDICATIONS:  1. Synthroid 0.075.  2. Lorazepam.  3. Singulair.  4. Flonase.  5. Darvocet.  6. Meclizine.  7. Aspirin 81 mg.  8. Occasional Allegra D.   No medication allergy.   OBJECTIVE:  Weight 138 pounds, BP 116/60, pulse 57, room air saturation  96%.  Bilateral nonobstructing cerumen.  What I can see of the tympanic  membranes looks normal.  Narrow nasal airway without inflammation of the  visible polyps.  Pharynx is clear.  Breathing unlabored.  Pulse regular.   IMPRESSION:  Chronic rhinosinusitis based on previous CT and probably  still the best active explanation for her complaints.  Eustachian  dysfunction.   PLAN:  1. She wants a nasal inhalation treatment and Depo-Medrol 80 mg IM,      which we discussed and provided.  2. Emphasis on saline lavage.  I have given her print information.  3. She is encouraged to follow up with Dr. Ezzard Standing.  4. Schedule return in 4 months, earlier p.r.n.     Clinton D. Maple Hudson, MD, Tonny Bollman, FACP  Electronically Signed    CDY/MedQ  DD: 08/14/2007  DT: 08/15/2007  Job #: [id number]   cc:   Kristine Garbe. E[facility], M.D.

## 2011-03-04 NOTE — Discharge Summary (Signed)
NAME:  [name], [name]                      ACCOUNT NO.:  [id number]   MEDICAL RECORD NO.:  [id number]                   PATIENT TYPE:  IPS   LOCATION:  4142                                 FACILITY:  [facility]   PHYSICIAN:  Ranelle Oyster, M.D.             DATE OF BIRTH:  [birth year]   DATE OF ADMISSION:  06/13/2002  DATE OF DISCHARGE:  06/19/2002                                 DISCHARGE SUMMARY   DISCHARGE DIAGNOSES:  1. Left total hip replacement.  2. Postoperative anemia.  3. Hypokalemia, resolved.   HISTORY OF PRESENT ILLNESS:  The patient is a 77 year old female with a  history of hypothyroidism, arthritis, and left hip pain secondary to OA, who  elected to undergo a left total hip replacement on June 10, 2002, by Gus Rankin. Aluisio, M.D.  Postoperatively, she was touch down weightbearing and on  Coumadin for DVT prophylaxis.  Her H&H was noted to be decreased post  surgery and she was transfused with two units of packed red blood cells.  The INR remained subtherapeutic at 1.5.  Physical therapy was initiated and  the patient was noted to be moderate assistance for transfers and moderate  assistance for ambulating 20 feet with standard walker.   PAST MEDICAL HISTORY:  Significant for:  1. Osteoporosis.  2. Hypothyroidism.  3. Urinary incontinence.  4. Right total hip replacement.   ALLERGIES:  No known drug allergies.   SOCIAL HISTORY:  The patient is married and lives in a one-level home with  three steps at entry.  She does not use any tobacco.  Alcohol occasionally.   HOSPITAL COURSE:  The patient was admitted to rehabilitation on June 05, 2002, for inpatient therapies to consistent of PT and OT daily.  Post  admission the patient has been maintained on Coumadin for DVT prophylaxis.  The patient has had problems with increased urinary incontinence.  A UAUCS  was sent off.  Follow-up labs were done on June 14, 2002, showing a  hemoglobin at 8.7, hematocrit  30.6, white count 6.5, platelets 162, sodium  138, potassium 3.2, chloride 104, CO2 27, BUN 10, creatinine 0.8, and  glucose 115.  Her hypokalemia was supplemented.  A urine culture done grew  greater than 100,000 colonies of __________  species.  No uropathogen.  However, as the patient was symptomatic, she was started on Macrodantin to  be treated for seven total days of antibiotic therapy.  Follow-up labs were  done on June 18, 2002, showing her hypokalemia.  Follow-up electrolytes  checked on June 18, 2002, showed the hypokalemia to be resolved with a  sodium of 138, potassium 4.0, chloride 104, CO2 28, BUN 14, creatinine 0.7,  and glucose 104.  The patient's pain control has been reasonable during the  stay.  The patient's PT and INR was therapeutic at the time of discharge at  24.6 and 3.6, respectively.  The patient was discharged on  4 mg a day of  Coumadin to continue on Coumadin through July 11, 2002.  The patient's  left hip incision has been healing well without any signs or symptoms of  infection or drainage noted.  Staples remain intact.  Moderate edema and  ecchymosis is resolving.  She is noted to have problems with abduction of  the left lower extremity and a leg loop was ordered to help assist with  this.  At the time of discharge, she is currently moderate independent for  transfers and moderate independent for ambulating 150 feet x2.  She  currently modified independent for ADLs.  She requires minimal assistance  for lower body dressing.  She will continue to receive followup home health  PT, OT, and R.N. by [facility].  The next pro time is to  be checked on June 24, 2002, with [facility] to follow and adjust  Coumadin as needed.   DISPOSITION:  On June 19, 2002, the patient is discharged to home.   DISCHARGE MEDICATIONS:  1. Macrobid 100 mg b.i.d. x 2 days.  2. Levothyroxine 75 mcg a day.  3. Ativan 1 mg q.h.s.  4.  Trinsicon one p.o. b.i.d.  5. Senokot-S two q.h.s.  6. Oxytrol 3.9 mg patch, changing twice a week.  7. Vioxx 25 mg a day.  8. Coumadin 4 mg a day.  9. Oxy-IR 5-10 mg p.o. q.4-6h. p.r.n. pain.  10.      Resume Fosamax as at home.  11.      Resume Estrace as at home.   ACTIVITY:  Touch down weightbearing on the right lower extremity with left  total hip precautions.  Continue using a walker.   DIET:  Regular.   WOUND CARE:  Keep the area clean and dry.   SPECIAL INSTRUCTIONS:  No alcohol, no smoking, and no driving.  Avoid  aspirin and aspirin-containing  medications.  [facility] to follow  Coumadin and to provide PT and R.N.   FOLLOW UP:  The patient is to follow up with Gus Rankin. Aluisio, M.D., in one  to two weeks.  Follow up with Marcy Salvo C. Lendell Caprice, M.D., for routine check  in three to four weeks.  Follow up with Reuel Boom L. Thomasena Edis, M.D., as needed.     Dian Situ, P.A.          Ranelle Oyster, M.D.    PP/MEDQ  D:  06/19/2002  T:  06/21/2002  Job:  [id number]   cc:   Janae Bridgeman. Eloise Harman., M.D.  Fax: [phone]   Gus Rankin Aluisio, M.D.

## 2011-03-04 NOTE — Op Note (Signed)
[facility]  Patient:    [name], [name]              MRN: [id number] Proc. Date: 04/12/01 Adm. Date:  [id number] Attending:  Ollen Gross V                           Operative Report  PREOPERATIVE DIAGNOSIS:  Osteoarthritis, right hip.  POSTOPERATIVE DIAGNOSIS:  Osteoarthritis, right hip.  OPERATION PERFORMED:  Right total hip arthroplasty.  SURGEON:  Ollen Gross, M.D.  ASSISTANT:  Della Goo, P.A.  ANESTHESIA:  General.  ESTIMATED BLOOD LOSS:  Approximately 400 cc.  DRAINS:  Hemovac x 1.  COMPLICATIONS:  None.  CONDITION:  Stable to recovery.  INDICATIONS FOR PROCEDURE:  [name] is a 77 year old female with severe osteoarthritis of both hips, right currently more symptomatic than the left. She presents now for right total hip arthroplasty.  DESCRIPTION OF PROCEDURE:  After the successful administration of general anesthetic, the patient was placed in the left lateral decubitus position with the right side up and held with hip positioner.  The right lower extremity was isolated from her perineum with plastic drapes and prepped and draped in the usual sterile fashion.  A short posterolateral incision was utilized.  Skin cut with a 10 blade, subcutaneous tissues, to the level of the fascia lata which was incised in line with the skin incision.  Short external rotators were isolated off the femur and capsulectomy performed.  The hip was dislocated and the center of the femoral head marked.  The center of the trial femoral head was placed such that it corresponded to the center of the native femoral head and the osteotomy line was marked on the femoral neck with an oscillating saw.  Osteotomy was then made.  The femoral head was removed, femur retracted anteriorly and the anterior capsule removed.  Acetabular exposure was then obtained.  Acetabular reaming was initiated with size 43 coursing in increments of 2 to 49.  A  50 mm Pentacle sector cup was then impacted into the acetabulum following her native anteversion with an excellent fit.  It transfixed with two additional dome screws.  The trial 28 mm neutral liner was then placed. Femoral preparation was initiated, first with a canal finder and then the canal was thoroughly irrigated.  Axial reaming was performed with a 13.5 mm. Proximal reaming up to an 18D.  The sleeve was machined to a small.  18D small sleeve was placed.  18 x 13 stem with a 36 +8 neck was then placed matching her native anteversion.  A trial 28 mm +0 head was placed, hip reduced with excellent stability, full extension, full external rotation, 70 degrees flexion, 40 degrees adduction, 90 degrees internal rotation and 90 degrees of flexion and 70 degrees internal rotation.  The trial prostheses were then removed.  The apex hole eliminator was placed into the acetabular shell and the permanent 28 mm neutral liner was placed.  The permanent 18D small sleeve was then impacted into the proximal femur, then the 18 x 13 stem with a 36 +8 neck was placed matching her native anteversion.  Permanent 28 +0 head was placed, hip reduced, same stability parameters.  Wound was copiously irrigated with antibiotic solution and short external rotators reattached to femur through drill holes.  The fascia lata was closed over a Hemovac drain with interrupted #1 Vicryl.  Subcu closed with interrupted #1, then interrupted 2-0 Vicryl.  Subcuticular with running 4-0 Monocryl.  Incisions clean and dry and Steri-Strips and sterile bulky dressing applied.  Drains hooked to suction. Knee immobilizer placed. Patient awakened and transported to recovery in stable condition. DD:  04/12/01 TD:  04/12/01 Job: [id number]

## 2011-03-04 NOTE — Discharge Summary (Signed)
NAME:  [name], [name]                      ACCOUNT NO.:  [id number]   MEDICAL RECORD NO.:  [id number]                   PATIENT TYPE:  INP   LOCATION:  0483                                 FACILITY:  [facility]   PHYSICIAN:  Gus Rankin. Aluisio, M.D.              DATE OF BIRTH:  10/29/1921   DATE OF ADMISSION:  06/10/2002  DATE OF DISCHARGE:  06/13/2002                                 DISCHARGE SUMMARY   ADMITTING DIAGNOSES:  1. Osteoarthritis left hip with protrusio deformity.  2. Status post right total hip replacement arthroplasty in June 2002.  3. Urine incontinence.  4. Hypothyroidism.  5. Osteoporosis.   DISCHARGE DIAGNOSES:  1. Osteoarthritis left hip status post left total hip replacement     arthroplasty.  2. Postoperative blood loss anemia.  3. Status post transfusion.  4. Status post right total hip replacement arthroplasty June 2002.  5. Urine incontinence.  6. Hypothyroidism.  7. Osteoporosis.   PROCEDURE:  The patient was taken to the operating room on June 10, 2002.  Underwent a left total hip replacement arthroplasty.  Surgeon Dr. Homero Fellers  Aluisio.  Assistant Avel Peace, P.A.-C.  Surgery under general anesthesia.  Hemovac drain x1.   CONSULTS:  Rehabilitation services, Dr. Riley Kill.   BRIEF HISTORY:  The patient is a 77 year old female who has been seen in  consultation by Dr. Lequita Halt for ongoing left hip pain.  She had undergone a  previous right total hip replacement back in June 2002 and has done  extremely well.  Recently, she has been seen for left hip pain.  She has  known osteoarthritis and found to have a protrusio deformity.  The pain in  the left hip has been progressive and reached a point where she would like  to consider undergoing left total hip replacement arthroplasty.  Risks and  benefits discussed.  She elected to proceed with surgery.   LABORATORY DATA:  CBC on admission:  Hemoglobin 12.3, hematocrit 35.4, white  cell count 4.5, red cell  count 3.80.  Differential within normal limits.  Postoperative H&H 9.1 and 26.6.  Continued to decline down to a level of 8.5  and 24.4.  Last noted at 8.1 and 23.3 at which point she was transfused with  2 units of blood.  The hemoglobin was pending at the time of transfer.  Will  be followed up while she is on rehabilitation services.  PT/PTT on admission  were 12.8 and 28, respectively with an INR of 0.9.  Serial pro times  followed per Coumadin protocol.  Last noted PT/INR 17.5 and 1.5.  Chemistry  panel on admission all within normal limits.  Follow-up BMET showed an  increase in glucose from 95 to 160, drop in calcium from 9.3 to 7.7.  Last  noted BMET:  She did have a drop in her sodium down to 134 and a drop in her  potassium to 3.3.  Electrolytes will  be rechecked on rehabilitation  services.  Glucose came back down from 160 to 128.  Urinalysis on admission  did show elevated specific gravity, trace ketones, otherwise negative.  Follow-up UA on June 12, 2002 negative.  Blood group type A+.  Urine  culture taken on June 12, 2002:  Urine culture reports stated culture was  reincubated for better growth.   HOSPITAL COURSE:  The patient was admitted to [facility] and taken  to the OR.  Underwent the above stated without complications.  The patient  tolerated the procedure well.  Was allowed to return to the recovery room  and then to the orthopedic floor for continued postoperative care.  Vital  signs were followed.  The patient received 24 hours postoperative  antibiotics.  Hemovac drain placed at the time of surgery was pulled on  postoperative day one.  PT and OT was consulted to assist with gait  training, ambulation, and ADLs.  She was started on total hip protocol.  The  patient had been to rehabilitation with a prior admission and wanted to try  rehabilitation again.  Rehabilitation consult was called.  The patient was  seen in consultation by Dr. Riley Kill.  It was  felt she would be an appropriate  candidate for inpatient rehabilitation.  Dressing changes were initiated on  postoperative day two.  Incision was healing well.  Hemoglobin did decline  after surgery, was noted to be 9.1 on postoperative day one and 8.5 on  postoperative day two indicative of some blood loss anemia.  She was  asymptomatic at that time.  She did have an elevation in temperature of  101.3 in the night between day one and day two.  UA was checked.  UA was  negative.  The patient slowly progressed with physical therapy, up  ambulating approximately 20 feet by postoperative day two.  By postoperative  day three patient was mildly tachycardic with a pulse of 105.  Blood  pressure was stable.  Hemoglobin continued to decline down from 8.5 to 8.1.  It was felt the increased pulse was likely due to her anemia.  She was  otherwise asymptomatic.  She did receive 2 units of blood.  It was also  noted later that day that a bed became available on the rehabilitation unit.  The patient was in agreement with continued inpatient rehabilitation.  Once  patient did receive her 2 units of blood without problems it was decided  that she would be transferred to inpatient rehabilitation for continued  care.  The patient was stable and was transferred later that day.   DISCHARGE PLAN:  The patient was transferred to [facility]  Unit on June 13, 2002.   DISCHARGE DIAGNOSES:  Please see above.   DISCHARGE MEDICATIONS:  1. Coumadin as per pharmacy protocol.  2. Percocet for pain.  3. Robaxin for spasm.  4. She is taking Synthroid 0.075 mg p.o. q.d.  5. Lorazepam 1 mg p.o. q.h.s.  6. Colace 100 mg p.o. b.i.d.  7. Trinsicon p.o. t.i.d.   DIET:  Diet as tolerated.   ACTIVITY:  She is touchdown weightbearing.  Gait training and ambulation as  per PT and OT for ADLs.  Continue total hip protocol.  Laboratories pending. Recommend follow-up H&H and BMET while in  rehabilitation.   DISPOSITION:  [facility].   FOLLOW UP:  The patient to follow up in the office two weeks from date of  surgery or following discharge from  [facility].   CONDITION ON DISCHARGE:  Improved.     Alexzandrew L. [place], [place]                Gus Rankin Aluisio, M.D.   ALP/MEDQ  D:  07/03/2002  T:  07/03/2002  Job:  [id number]

## 2011-03-04 NOTE — Op Note (Signed)
TNAMENEHEMIE, CASSERLY                     ACCOUNT NO.:  [id number]   MEDICAL RECORD NO.:  [id number]                   PATIENT TYPE:  INP   LOCATION:  X001                                 FACILITY:  [facility]   PHYSICIAN:  Gus Rankin. Aluisio, M.D.              DATE OF BIRTH:  Nov 22, 1921   DATE OF PROCEDURE:  06/10/2002  DATE OF DISCHARGE:                                 OPERATIVE REPORT   PREOPERATIVE DIAGNOSIS:  Osteoarthritis, left hip.   POSTOPERATIVE DIAGNOSIS:  Osteoarthritis, left hip.   PROCEDURE:  Total hip arthroplasty.   SURGEON:  Gus Rankin. Aluisio, M.D.   ASSISTANT:  Alexzandrew L. Julien Girt, P.A.   ANESTHESIA:  General.   ESTIMATED BLOOD LOSS:  400   DRAINS:  Hemovac x1.   COMPLICATIONS:  None.   CONDITION:  Stable to recovery.   BRIEF CLINICAL NOTE:  Ms. Mulhall is a 77 year old female with severe  osteoarthritis of the left hip, pain refractory to nonoperative management.  She had a very successful right total hip arthroplasty approximately a year  ago and presents now for left total hip arthroplasty.   DESCRIPTION OF PROCEDURE:  After successful administration of general  anesthetic, the patient was placed in the right lateral decubitus position  with the left side up and held with a hip positioner. The left lower  extremity was isolated from the perineum with plastic drapes and prepped and  draped in the usual sterile fashion. A short posterolateral  incision was  made with a 10 blade in the subcutaneous tissue to the level of the fascia  lata which was incised in line with the skin incision. The sciatic nerve was  palpated and protected and the short external rotators isolated off the  femur. Capsulectomy was then performed. The hip was dislocated and the  center of the femoral head marked. Trial prosthesis placed as the center of  the trial head corresponds to the center of her native femoral head. The  osteotomy is then marked and ostomy made with an  oscillating saw. The head  was removed and then the femur retracted anteriorly.   Acetabular exposure was obtained. The lateral marginal rim osteophyte was  removed. A backstab reaming started at a 43 coursing in increments of 2 to a  49 and a 50 mm pinnacle acetabular shell was placed. It was transfixed with  a single bone screw and she had excellent purchase. A 28 mm neutral liner  was placed initially. The femur was then addressed first with the canal  finder and then canal irrigation. Axial reaming was performed to a 13.5 mm  proximal reaming 18D and then the sleeve was machined to a small. An 18D  small trial sleeve was placed and 18/13 stem 36 plus 8 neck matching her  native anteversion. A 28 plus zero head was the first placed. A fair amount  of soft tissue laxity which I felt was secondary to the  fact that we reamed  the cuff centrally for best purchase and thus she needed some more offset.  We trialed an offset liner and she had much better stability at that point.   The trials were all removed and then the permanent apex hole eliminator and  permanent 28 mm neutral plus 4 liner placed through the acetabular shell. A  permanent 18D small sleeve and 18 x 13 stem with 36 plus 8 neck are placed.  The neck matches her native anteversion. The hip was then reduced with a 28  plus zero trial and then I went to 28 plus 3 trial which appeared to have  better soft tissue tension. She had phenomenal stability with full extension  and full external rotation, 70 degrees of flexion, 40 degrees adduction and  over 90 degrees of internal rotation and then 90 degrees of flexion and  greater than 90 degrees internal rotation. The hip was then dislocated,  trial head removed, permanent 28 plus three head placed. The wound was  copiously irrigated with antibiotic solution, short rotators reattached to  the femur. The fascia lata was closed over a Hemovac drain with interrupted  #1 Vicryl, subcu  closed with #1 and 2-0 Vicryl and subcuticular running 4-0  Monocryl. The incision was clean and dry and Steri-Strips and bulky sterile  dressing applied. Drains hooked to suction. She was placed in a knee  immobilizer, awakened and transported to recovery in stable condition.                                               Gus Rankin Aluisio, M.D.    FVA/MEDQ  D:  06/10/2002  T:  06/12/2002  Job:  [id number]

## 2011-03-04 NOTE — Op Note (Signed)
[name], [name]            ACCOUNT NO.:  [id number]   MEDICAL RECORD NO.:  [id number]          PATIENT TYPE:  INP   LOCATION:  0101                         FACILITY:  [name]   PHYSICIAN:  Kerrin Champagne, M.D.   DATE OF BIRTH:  [birth year]   DATE OF PROCEDURE:  11/27/2004  DATE OF DISCHARGE:                                 OPERATIVE REPORT   PREOPERATIVE DIAGNOSIS:  Left dislocated total hip arthroplasty, 6 cm  laceration of the left thumb thenar eminence.   POSTOPERATIVE DIAGNOSIS:  Left dislocated total hip arthroplasty, 6 cm  laceration of the left thumb thenar eminence.   PROCEDURE:  Closed reduction of left total hip dislocation, then suture  removal of laceration of the left thumb thenar eminence.   SURGEON:  Vira Browns, M.D.   ASSISTANT:  Wende Neighbors, P.A.-C.   ANESTHESIA:  GOT, Dr. Suzie Portela.   ESTIMATED BLOOD LOSS:  2 mL.   COMPLICATIONS:  None.   BRIEF CLINICAL HISTORY:  The patient is an 77 year old female who is three  years out from a left total hip arthroplasty.  Previous history of bilateral  total hip arthroplasties by Dr. Homero Fellers _Aluscio_________.  She reportedly  this morning was bending over to pick up a newspaper and then began standing  up and turned and felt left hip give.  She fell to the ground with left hip  pain.  Laceration of her left thumb as she grabbed a wall as she was  falling.  The patient was seen in the emergency room, evaluated and found to  have the left total hip dislocation in a superior posterior position.  She  was also found to have a laceration of the left thumb thenar eminence, a  flat based distal measuring about 6-7 cm in total length.   INTRAOPERATIVE FINDINGS:  The patient's left total hip arthroplasty reduced  with 90/90 traction, longitudinal traction of the hip with the hip flexed 90  degrees and 90 degrees of flexion with internal rotation of 20 to 30  degrees.  This reduced quite easily.  Range of motion of the  hip after  having performed reduction indicated flexion was possible to almost 110 to  115 degrees, internal rotation to 90 degrees, almost 50 to 55 degrees before  any sense of subluxation.  The hip did not dislocate with this maneuver.   DESCRIPTION OF PROCEDURE:  After adequate general anesthesia and intubation,  the left lower extremity was flexed at the hip 90 degrees and at the knee 90  degrees.  Longitudinal traction was placed on the thigh and internal  rotation reducing the left hip with an audible palpable clunk.  The leg was  then brought out to full extension.  A flat plate radiograph was obtained  demonstrating reduction of the left hip.  Note that intraoperative range of  motion of the hip was performed trying to discern what the range of motion  of stability was and flexion over 115 degrees was possible with the leg in  neutral internal and external rotation.  With the hip flexed at 90 degrees.  Internal rotation almost  50 to 55 degrees was possible without any signs of  subluxation or dislocation.  Following reduction of the hip and radiograph  demonstrated concentric reduction.  The left upper extremity was prepped  from finger tips to the mid-forearm with Betadine solution and draped in the  usual manner.  Laceration over the left thenar eminence was carefully  irrigated and the edges of skin debrided of any devitalized tissue or thin  areas that would not survive.  Next, 3-0 Vicryl was then used to approximate  the subcu layers in simple fashion.  Then the skin was closed with  interrupted 5-0 Prolene sutures in an interrupted fashion.  A single rubber  band drain was placed exiting out the radial aspect of the laceration.  This  was placed over the proximal radial aspect of the wound where there was some  very mild amount of dead space present.  The patient then had a dressing  placed using Bacitracin, Adaptic 4 x 4's affixed to the skin with sterile  Webril and a thumb  cutter radial gutter splint was then applied.  A 4-0  nylon suture was then attached to the rubber band drained to allow for its  removal later.  The patient was then reactivated, extubated, returned to the  recovery room in satisfactory condition.  All instrument and sponge counts  were correct.      JEN/MEDQ  D:  11/27/2004  T:  11/28/2004  Job:  [id number]

## 2011-03-04 NOTE — Discharge Summary (Signed)
[facility]  Patient:    [name], [name]              MRN: [id number] Adm. Date:  [id number] Disc. Date: 04/16/01 Attending:  Faith Rogue T Dictator:   Irena Cords, P.A.-C CC:         Trudee Grip, M.D.  Daniel L. Thomasena Edis, M.D.   Discharge Summary  PRIMARY DIAGNOSIS: End-stage right hip osteoarthritis.  SECONDARY DIAGNOSES: 1. Hypothyroidism with a benign thyroid nodule. 2. Anxiety disorder. 3. Seasonal allergies. 4. Acute postoperative hemolytic anemia, status post transfusion.  SURGICAL PROCEDURE:  Right total hip arthroplasty by Dr. Despina Hick with the assistance of Della Goo, P.A.-C on April 12, 2001. Please see operative summary for further details.  CONSULTATIONS:  Dr. Thomasena Edis in physical medicine and rehabilitation.  LABORATORY STUDIES:  Patients preoperative hemoglobin was 11.7, postoperative on the first day was 10.1, did drop to a low of 8.1 on June 30 and then was back up to 10.5 on July 1. PT and INR were 18.1 and 1.8 on July 1. Her Chem 7 was essentially all within normal limits with just mildly elevated glucose of 163. Her preoperative urinalysis showed a trace of ketones but otherwise was nitrite and leukocyte esterase negative.  Chest x-ray showed COPD with no acute process.  A preoperative EKG showed normal sinus rhythm and a normal ECG on April 05, 2001.  CHIEF COMPLAINT:  Right hip pain.  HISTORY OF PRESENT ILLNESS:  [name] is a 77 year old female who has had progressively increasing bilateral hip pain which has not responded to conservative treatment. She was found to have severe osteoarthritis with protrusive deformity on that right hip. Because of lack of improvement with conservative measures, we discussed the risk, benefits, and alternatives of total hip arthroplasty. She was understanding of these risks and benefits and wished to proceed with the surgery. Signed surgical consents were  obtained as well as a preoperative labs. She was then admitted on June 27 for surgical intervention.  HOSPITAL COURSE:  Following surgical procedure, the patient was taken to the PACU in stable condition, transferred to the orthopedic floor in good condition. She did well during her hospital stay. She was up with physical therapy, touchdown weightbearing on the right lower extremity with the assistance of a walker. She was receiving Coumadin per pharmacy dosing for DVT prophylaxis. Prior to discharge her PT was 18.1 and INR of 1.8. A rehabilitation was also obtained. Dressings were changed on postoperative day #2 and subsequently found to be clean, dry and intact without signs or symptoms of infection. On postoperative day #3 her hemoglobin was down to 8.2 and she was transfused two units of packed red blood cells. This brought her hemoglobin up to 10.5 on her final hospital day. She received also three doses of Ancef 1 g IV postoperatively. She was initially on PCA morphine for pain management and then subsequently Percocet p.o. which she tolerated well. By postoperative day #4 she was progressing fairly well and was stable and ready for transfer to the [facility].  DISCHARGE PLAN: 1. The patient will be transferred to [facility] for inpatient Rehab. 2. She is to follow up with Dr. Despina Hick in two weeks. She is to call    [phone] for an appointment. 3. She is to resume a regular diet. 4. She may begin showering starting tomorrow. 5. Once daily dressing changes to the right hip. 6. Pro times to be drawn per protocol. 7. Touchdown weightbearing on the  right lower extremity with the    assistance of a walker and crutches. Follow total hip precautions. 8. Continue with the knee immobilizer while in bed.  DISCHARGE MEDICATIONS:  1. Trinsicon one p.o. t.i.d. p.c.  2. Robaxin 500 mg p.o. q.8h. p.r.n. spasm.  3. Percocet 5/325 one to two p.o. q.4-6h. p.r.n. pain.  4. Coumadin per  pharmacy dosing.  5. Colace 100 mg p.o. b.i.d.  6. Levothyroxine 75 mcg p.o. q.d.  7. Claritin 10 mg p.o. q.d.  8. Lorazepam 1 mg p.o. q.d.  9. Laxative of choice and enema of choice. 10. Reglan 10 mg p.o. q.8h. p.r.n. nausea and vomiting. 11. Restoril 15 mg p.o. q.h.s. p.r.n. sleep. 12. Phenergan 25 mg IM, p.o., or per rectum q.6h. p.r.n. nausea or vomiting. DD:  04/16/01 TD:  04/16/01 Job: [id number]

## 2011-03-04 NOTE — H&P (Signed)
NAME:  [name], [name]                      ACCOUNT NO.:  [id number]   MEDICAL RECORD NO.:  [id number]                   PATIENT TYPE:  INP   LOCATION:  NA                                   FACILITY:  [facility]   PHYSICIAN:  Gus Rankin. Aluisio, M.D.              DATE OF BIRTH:  15-Oct-1922   DATE OF ADMISSION:  06/10/2002  DATE OF DISCHARGE:                                HISTORY & PHYSICAL   CHIEF COMPLAINT:  Left hip pain.   HISTORY OF PRESENT ILLNESS:  The patient is a 77 year old female who has  been seen in consultation by Dr. Ollen Gross for ongoing left hip pain.  She does have a previous history of undergoing a right total hip replacement  arthroplasty back in 6/02, and states she is doing extremely well from this.  She has been followed recently for left hip pain.  She does have a known  history of osteoarthritis with protrusio deformity.  She has been treated  conservatively in the past for this.  The left hip has been quite  progressive, and she has reached a stage now where she was considering  pursuing left total hip replacement arthroplasty.  It is felt she would  benefit from undergoing procedure.  Risks and benefits have been discussed,  and she has elected to proceed with surgery.   ALLERGIES:  No known drug allergies.   CURRENT MEDICATIONS:  1. Synthroid 0.75 mg p.o. q.d.  2. Vioxx 25 mg q.d., stopped prior to surgery.  3. Allergy shots weekly.  4. Lorazepam 1 mg q.h.s.  5. Fosamax 70 mg weekly.  6. Estrace.  7. Calcium q.d.  8. Oxitrol 3.4 mg q.d.  9. Darvocet for pain.   PAST MEDICAL HISTORY:  1. Urinary incontinence.  2. Osteoporosis.  3. Arthritis.  4. Hypothyroidism.   PAST SURGICAL HISTORY:  1. Right total hip replacement arthroplasty in 6/02.  2. Colonoscopy in 1/02.   FAMILY HISTORY:  Noncontributory.   SOCIAL HISTORY:  She is married, retired, nonsmoker, occasional alcohol, one  child.  Husband will be assisting her with postoperative  care.  She has a  one story home with three steps entering.   REVIEW OF SYMPTOMS:  GENERAL:  No fevers, chills, night sweats.  NEUROLOGIC:  No seizures, syncope, or paralysis.  RESPIRATORY:  No shortness of breath,  productive cough, or hemoptysis.  CARDIOVASCULAR:  No chest pain, angina, or  orthopnea.  GASTROINTESTINAL:  No nausea, vomiting, diarrhea, or  constipation.  GENITOURINARY:  She does have some urgency and nocturia.  No  dysuria, hematuria, or discharge.  MUSCULOSKELETAL:  Pertinent to left hip  found in the history of present illness.   PHYSICAL EXAMINATION:  VITAL SIGNS:  Pulse 56, respiratory rate 12, blood  pressure 148/78.  GENERAL:  The patient is a 77 year old female, well-developed, well-  nourished, no acute distress.  HEENT:  Normocephalic, atraumatic.  Pupils are round and reactive.  The  patient is noted to wear glasses.  Extraocular movements were intact.  Oropharynx clear.  NECK:  Supple, no carotid bruits.  CHEST:  Clear to auscultation anterior and posterior chest walls.  No  rhonchi or rales.  HEART:  Huston Foley rhythm at 54, regular rate, no murmurs, S1 and S2 noted.  ABDOMEN:  Soft, slightly round, nontender to palpation, bowel sounds are  present.  RECTAL:  Not done.  Not pertinent to present illness.  BREASTS:  Not done.  Not pertinent to present illness.  GENITALIA:  Not done.  Not pertinent to present illness.  EXTREMITIES:  To the left hip she only has flexion up to 90 degrees.  No  internal rotation.  Only 10 degrees of external rotation.  Only 10 degrees  of abduction.  She does have pain noted with passive motion.  Sensation is  intact.   IMPRESSION:  1. 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  2. Status post right total hip replacement arthroplasty in 6/02.  3. Urinary incontinence.  4. Hypothyroidism.  5. Osteoporosis.   PLAN:  The patient will be admitted to the hospital to undergo a left total  hip replacement  arthroplasty.  Surgery will be performed by Dr. Ollen Gross.  The patient's medical physician is Dr. Dimas Alexandria.  Dr.  Lendell Caprice will be notified with the room number on admission, will be  consulted if needed for medical assistance with this patient throughout the  hospital course.  From her last hip surgery she had a short stay in rehab,  and wishes to look into this option for post-hospital care again.      Alexzandrew L. Julien Girt, P.A.              Gus Rankin Aluisio, M.D.    ALP/MEDQ  D:  06/09/2002  T:  06/10/2002  Job:  [id number]

## 2011-03-04 NOTE — Assessment & Plan Note (Signed)
[name] [name]                             PULMONARY OFFICE NOTE   NAME:[name] [name] KUKLINSKI                   MRN:          [id number]  DATE:12/26/2006                            DOB:          [birth year]    PROBLEMS:  1. Allergic rhinitis.  2. Eustachian dysfunction.  3. Chronic sinusitis (Dr. Kristine [name]. [name] [name]).   HISTORY:  Her primary physician, Dr. Marcy Salvo C. Lendell [name], has retired.  She is establishing with somebody new but does not have the name.  She  returns now noticing Spring pollen causing some congestion and she is  having bilateral tinnitus.  She feels that nasal congestion never really  changes.  She is never able to produce anything that looks purulent.  CT  of the sinuses was positive for sinusitis in October and she had seen  Dr. Ezzard [name].  She had stopped allergy vaccine in November.  Mainly she  complains of a heavy, full sensation to her head, not really pain.  Lorazepam sometimes helps this.   MEDICATIONS:  1. Synthroid.  2. Aspirin 81 mg.  3. Lorazepam.  4. Singulair.  5. Flonase.  6. Vicodin.  7. Meclizine.   NO MEDICATION ALLERGY.   We had given her Entex PSE, Augmentin, and ipratropium nasal spray in  December.  She did not follow up with Dr. Ezzard [name].   OBJECTIVE:  Weight 127 pounds, blood pressure 110/70, room air 95%.  There is some scarring on her tympanic membranes with no erythema or  bulging.  Turbinates are somewhat edematous.  Pharynx is clear with no  evidence of postnasal drainage or adenopathy.  Conjunctivae are clear.  CHEST:  Clear.   IMPRESSION:  1. Chronic sinusitis.  2. Allergic rhinitis.  3. Some eustachian dysfunction.   PLAN:  Neo-Synephrine, Depo 80 which she requests, saline nasal spray  used as lavage, Omnicef 600 mg daily for 2 weeks, Mucinex p.r.n.  Schedule return 6 weeks, earlier p.r.n.     [name] D. Maple Hudson, MD, [name] [name], FACP  Electronically Signed    CDY/MedQ  DD: 12/27/2006  DT:  12/29/2006  Job #: [id number]

## 2011-03-04 NOTE — Assessment & Plan Note (Signed)
[facility]                               PULMONARY OFFICE NOTE   NAME:[name] [name] HELDERMAN                   MRN:          [id number]  DATE:06/26/2006                            DOB:          02/24/1922    HISTORY OF PRESENT ILLNESS:  The patient is an 77 year old white female  patient of Dr. Roxy Cedar who has a known history of allergic rhinitis on a  1:10 vaccine.  The patient presents to our office complaining of a three day  history of nasal congestion, watery and itchy eyes and nasal drainage.  The  patient denies any purulent sputum, fever, chest pain, shortness of breath  or wheezing.   CURRENT MEDICATIONS:  Reviewed.   PHYSICAL EXAMINATION:  GENERAL:  The patient is a pleasant female in no  acute distress.  VITAL SIGNS:  Afebrile, stable vital signs.  O2 saturation is 98% on room  air.  HEENT:  Nasal mucosa is pale and edematous.  No sinus tenderness to  percussion.  Conjunctivae slightly pale.  TMs normal.  NECK:  Supple without adenopathy.  LUNGS:  Clear without any wheezing or crackles.  CARDIAC:  Regular rate and rhythm.  ABDOMEN:  Soft.  EXTREMITIES:  Warm without any edema.   IMPRESSION/PLAN:  Acute rhinitis flare.  The patient is to continue on  Flonase two puffs twice daily, nasal hygiene regimen with saline and Afrin  nasal spray x5 days.  Instructional sheet given.  The patient may use  Allegra D one daily as needed.  Samples x7 days were given.  The patient was  given a nasal decongestant nebulizer treatment in the office.  The patient  is to return as scheduled with Dr. Maple Hudson in one month or sooner if need be.                                   Rubye Oaks, NP                                Clinton D. Maple Hudson, MD, [facility], FACP   TP/MedQ  DD:  06/26/2006  DT:  06/27/2006  Job #:  [id number]

## 2011-03-04 NOTE — Discharge Summary (Signed)
[facility]  Patient:    [name], [name]              MRN: [id number] Adm. Date:  [id number] Disc. Date: [id number] Attending:  Faith Rogue T Dictator:   Junie Bame, P.A. CC:         Ollen Gross, M.D.  Janae Bridgeman. Eloise Harman., M.D.   Discharge Summary  DISCHARGE DIAGNOSES: 1. Status post right total hip arthroplasty due to end-stage osteoarthritis. 2. Hypothyroidism. 3. Anxiety.  HISTORY OF PRESENT ILLNESS:  The patient is a 77 year old white female admitted on April 12, 2001, at [facility] for right total hip arthroplasty secondary to end-stage OA disease by Ollen Gross, M.D. Postoperative complications include hemorrhagic anemia. The patient received two units of packed red blood cells.  The patient was begun on Coumadin for DVT prophylaxis. The PT report at that time indicated the patient was ambulating four feet with minimum assist with standard walker, could transfer sit to stand with minimum assist. She is touchdown weightbearing.  She used a knee immobilizer when in bed.  PAST MEDICAL HISTORY:  Significant for OA, hypothyroidism, anxiety, seasonal allergies.  MEDICATIONS PRIOR TO ADMISSION:  Synthroid, Vioxx, Allegra, Ativan, calcium, potassium, vitamins, and allergy shots.  ALLERGIES:  No known drug allergies.  PAST SURGICAL HISTORY:  Significant for bladder scan and tonsillectomy.  SOCIAL HISTORY:  [place] in a one level home.  She has two steps to entry. She was independent prior to admission.  No tobacco usage and occasional alcohol usage.  HOSPITAL COURSE:  Mrs. Darsey was admitted to rehab on April 16, 2001, for comprehensive inpatient rehabilitation where she received more than three hours of PT and OT daily.  Mrs. Haggard did fairly well during her eight day stay in rehab.  Her first three days of rehab hospital course is significant for  hypokalemia and constipation.  The patient was started on Senokot S two tablets p.o. q.h.s. for constipation and the patient was started on K-Dur 20 mEq p.o. q.d. x 3 days and once potassium became stable it was discontinued on day #3.  On April 18, 2001, the patient began to complain about headaches and dizziness upon standing. Orthostatic blood pressures were monitored for three days. The patient did reveal to have some orthostasis.  On April 19, 2001, she was placed on ______________ while ambulating.  She remained on Coumadin during her stay in rehab for DVT prophylaxis.  Constipation began to resolve on the stool softener.  She remained on Synthroid 75 mcg for hypothyroidism throughout her stay in rehab as well. There were no other major medical complications during her stay in rehab.  LABS:  Her TSH was 1.945.  Her latest hemoglobin and hematocrit was 11.5 and 33.5.  Her white blood cell count was 6.2. Latest sodium was 141, potassium 3.8, BUN 12, creatinine 0.7, glucose 105.  At the time of discharge her incision had dissolvable sutures with Steri-Strips in place over the incision.  It demonstrated no signs of infection.  Latest vitals were blood pressure 100/58, respiratory rate 20, pulse 64, and temperature 98.3.  Other physical examination findings were unremarkable.  Latest PT report indicated that the patient was ambulating with supervision level with touchdown weightbearing and with standard walker 150 feet and could transfer sit to stand with supervision slowly.  She could perform all ADLs modified independently.  She was discharged home with her husband.  DISCHARGE MEDICATIONS: 1.  Synthroid, resume home regimen. 2. Ativan one tablet at night. 3. Allegra one tablet daily. 4. Coumadin 4 mg daily for 18 more days. 5. Calcium three times daily. 6. Baby aspirin; she is not to take while on Coumadin. 7. Multivitamin one tablet daily. 8. Vioxx, she was to wait to take until she  finished Coumadin, one tablet    daily. 9. Darvocet-N one or two tablets as needed every four to six hours for pain.  ACTIVITY:  She is to observe hip precautions.  No driving until follow-up with with Dr. Lequita Halt.  FOLLOW-UP:  She will receive [facility] for PT, OT, and they are to monitor Coumadin, first draw will be Thursday, April 26, 2001. She will follow up Dr. Dr. Homero Fellers Aluisio within two weeks. She will follow up Faith Rogue, M.D., as needed and follow up with primary care Jayni Prescher, Janae Bridgeman. Eloise Harman., M.D., within four to six weeks. DD:  04/24/01 TD:  04/24/01 Job: [id number]

## 2011-03-04 NOTE — H&P (Signed)
[facility]  Patient:    [name], [name]                     MRN: [id number] Adm. Date:  04/12/01 Attending:  Ollen Gross, M.D. Dictator:   Dorie Rank, P.A.                         History and Physical  DATE OF BIRTH:  14-Jun-1922  CHIEF COMPLAINT:  Right hip pain.  HISTORY OF PRESENT ILLNESS:  [name] is a 77 year old white female who has had progressive bilateral hip pain that has been refractory to conservative treatment. It is at a point now where it is interfering with her activities of daily living. On Feb 27, 2001, x-rays were reviewed from [facility] and revealed she had severe osteoarthritis to the bilateral hips with a protrusio deformity. The right was slightly more affected than the left. On physical exam to the right hip, it was noted on range of motion to the right hip she was able to flex to about 85 degrees with no abduction and no adduction at 5 degrees of external rotation. No internal rotation to the right hip. Leg lengths were noted to be equal. Peripheral pulses were 2+ and symmetrical. Sensation was intact to the bilateral lower extremities. It was noted that she did walk with a slow waddling gait with a positive abductor lurch towards to the right. It was felt due to the diagnostic studies, as well as physical exam and interference with her ADLs, she would benefit from undergoing a right total hip arthroplasty. The risks and benefits, as well as the procedure, were discussed with the patient. She agreed to proceed.  PAST MEDICAL HISTORY:  Osteoarthritis. She has a history of hypothyroidism with a benign thyroid nodule. She has a history of anxiety, seasonal allergies.  PAST SURGICAL HISTORY:  She had a tonsillectomy as a child. She had a ______ sling surgery in 1998. Colonoscopy x 2 with polypectomy in January of DD:  04/05/01 TD:  04/05/01 Job: [id number]

## 2011-03-04 NOTE — Assessment & Plan Note (Signed)
[facility]                               PULMONARY OFFICE NOTE   NAME:[name], [name]                   MRN:          [id number]  DATE:07/18/2006                            DOB:          1922/03/24    PROBLEMS:  1. Allergic rhinitis.  2. Eustachian dysfunction.   HISTORY:  She had seen a nurse practitioner here September 10 with increased  nasal congestion.  Treated with short term use of Afrin, Allegra D, and  continued use of her Flonase.  We had increased her allergy vaccine to 1:10  and that has been well-tolerated.  She has an EpiPen and we reviewed risk  and safety issues again.  Today she says she is just feeling some head  pressure.  No pain.  Nose runs before meals, clear watery discharge.  She  says prednisone really makes her feel better and she asks about a low-dose  maintenance trial of prednisone.  We spent a long time discussing this,  reviewing prednisone side effects, concerns, et Karie Soda.   MEDICATIONS:  1. Allergy vaccine now at 1:10.  2. Synthroid.  3. Aspirin 81 mg.  4. Lorazepam.  5. Singulair.  6. Flonase.  7. Darvocet.  8. Vicodin.  9. Meclizine.  10.Forteo.  11.Allegra D.   ALLERGIES:  No medication allergy.   OBJECTIVE:  Weight 138 pounds, BP 110/60, pulse regular 53, room air  saturation 96%.  There is some scarring of the left tympanic membrane.  Nasal mucosa actually  looks somewhat atropic.  I do not see discharge or polyps.  The pharynx is  clear.  Right TM is retracted a bit without fluid.   IMPRESSION:  1. Nasal congestion.  2. Allergic rhinitis.  3. Eustachian dysfunction.   PLAN:  1. Limited CT scan of sinuses.  2. Depo-Medrol 80 mg IM.  3. Maintenance prednisone 5 mg daily.  4. Schedule return in 2 months but earlier p.r.n.  Will be in touch after      her CT scan.  I have asked her about seeing Dr. Ezzard Standing again and she is      checking to see if she has an appointment.       Clinton  D. Maple Hudson, MD, FCCP, FACP      CDY/MedQ  DD:  07/23/2006  DT:  07/24/2006  Job #:  [id number]   cc:   Janae Bridgeman. Eloise Harman., M.D.  Kristine Garbe. Ezzard Standing, M.D.

## 2011-03-04 NOTE — Assessment & Plan Note (Signed)
[facility]                             PULMONARY OFFICE NOTE   NAME:[name], [name] MANGINI                   MRN:          [id number]  DATE:09/18/2006                            DOB:          1921-11-03    PROBLEM:  1. Allergic rhinitis.  2. Eustachian dysfunction.  3. Chronic sinusitis.   HISTORY:  I had called her in October about the result of her sinus CT  showing chronic mucosal thickening consistent with chronic sinusitis.  She has previously seen by Dr. Narda Bonds but has not followed up.  She asks about drug interaction with grapefruit and I told her it would  be best if she just did not take the grapefruit routinely.  She stopped  her allergy vaccine a month ago, expecting that she would see some  sudden worsening when she quit.  I explained the nature of the response  an that it might take her a year watching seasonal exposure changes  before she noticed if it was making a difference to be off it.  She will  remain off now.  She denies headache or fever, purulent discharge or  blood.  She has been using Nasonex but has not been using Allegra D  which she had had.  Prednisone trial did not help.  Complains of  intermittent watery rhinorrhea especially when she eats.   MEDICATION:  1. Synthroid.  2. Aspirin 81 mg.  3. Lorazepam.  4. Singulair.  5. Flonase.  6. Darvocet.  7. Vicodin.  8. Meclizine 1/2 tablet.  9. Forteo.  10.Prednisone trial at 5 mg daily starting October 2nd, now      discontinued.   NO MEDICATION ALLERGY.   OBJECTIVE:  Weight 138 pounds, BP 102/58, pulse regular at 67, room air  saturation 94%.  An elderly woman, quite alert, no periorbital puffiness  or conjunctival injection.  Nose may be a little bit stuffy but there is  no evident postnasal drainage or pharyngeal erythema, no neck vein  distention or stridor and voice quality is not hoarse.  CHEST:  Quiet unlabored breathing, no cough or wheeze.  HEART  SOUNDS:  Regular without murmur.   IMPRESSION:  1. Chronic sinusitis.  2. Allergic rhinitis.  3. Watery rhinorrhea.   PLAN:  1. I discussed treatment options for management of sinus disease and      encouraged her to follow up with Dr. Ezzard Standing who has seen her      before.  2. Try ipratropium 0.06% nasal spray once or twice each nostril p.r.n.      watery rhinorrhea for trial.  3. Entex PSE one daily or b.i.d. as tolerated and needed for head      congestion.  4. Augmentin 500 mg b.i.d. for 10 days.  5. Schedule return in 6 weeks, earlier p.r.n.     Clinton D. Maple Hudson, MD, [facility], FACP  Electronically Signed    CDY/MedQ  DD: 09/18/2006  DT: 09/19/2006  Job #: [id number]   cc:   Janae Bridgeman. Eloise Harman., M.D.  Kristine Garbe. Ezzard Standing, M.D.

## 2011-03-04 NOTE — Op Note (Signed)
[name], [name] NO.:  [id number]   MEDICAL RECORD NO.:  [id number]          PATIENT TYPE:  EMS   LOCATION:  MAJO                         FACILITY:  [facility]   PHYSICIAN:  Vania Rea. Supple, M.D.  DATE OF BIRTH:  04-14-1922   DATE OF PROCEDURE:  11/10/2006  DATE OF DISCHARGE:                               OPERATIVE REPORT   PREOPERATIVE DIAGNOSIS:  Dislocated left total hip arthroplasty.   POSTOPERATIVE DIAGNOSIS:  Dislocated left total hip arthroplasty.   PROCEDURE:  Closed reduction of left total hip arthroplasty.   SURGEON OF RECORD:  Supple.   ANESTHESIA:  Was general endotracheal.   HISTORY:  [name] is an 77 year old female status post bilateral hip  replacements several years ago and unfortunately had sustained a  previous left hip dislocation approximately 2 years ago and underwent  successful closed reduction.  She, however, as always had the sensation  the left hip felt loose.  She was at home earlier today getting out of  bed when she apparently twisted awkwardly and experienced immediate  onset of left hip pain and inability to bear weight.  She was brought to  [facility] Emergency Room where x-rays confirmed a posterior superior  dislocation of the left total hip arthroplasty.  She was brought to the  operating room at this time for planned closed reduction.   Preoperatively, we counseled [name] on treatment options as well as  risks versus benefits thereof.  Possible complications of periarticular  fracture and/or dislocation, recurrent dislocation reviewed.  She  understands and accepts and agrees with plan.   PROCEDURE IN DETAIL:  After undergoing routine preoperative evaluation,  the patient was brought to the operating room and placed supine on the  operating table and underwent smooth induction of a general endotracheal  anesthesia.  After appropriate relaxation, a gentle reduction maneuver  was performed with flexion,  longitudinal traction and gentle internal  rotation of the left hip.  The hip reduced easily.  The hip was taken  through a range of motion and did show evidence for instability with  recurrent dislocation occurring when the hip was flexed 90 degrees, and  the hip was adducted more than 10 degrees.  The reduction occurred quite  easily.  However, the hip was stable in full extension up to 90 degrees  of flexion and as long as the hip was abducted at least 30 degrees.  At  this point, fluoroscopic images were then obtained confirming concentric  reduction of the hip.  The left lower was placed into a knee  immobilizer.  The patient was then awakened, extubated and taken to the  recovery room in stable condition.     Vania Rea. Supple, M.D.  Electronically Signed    KMS/MEDQ  D:  11/10/2006  T:  11/11/2006  Job:  [id number]

## 2011-04-13 ENCOUNTER — Telehealth: Payer: Self-pay | Admitting: Internal Medicine

## 2011-04-13 NOTE — Telephone Encounter (Signed)
Spoke with pt. She states needing refill on meclizine "I keep in on hand". She states that CDY has prescribed this for her in the past for her "dizzy spells". She states has 1 tablet left, and needs refill sent to pharm. I advised that will need to check with CDY to be sure this is okay b/c med is not on her current list. Pt verbalized understanding. I also sched her for rov with CDY for 04/19/11 as she was overdue. Please advise, thanks!

## 2011-04-14 MED ORDER — MECLIZINE HCL 25 MG PO TABS: 25.0000 mg | ORAL_TABLET | Freq: Two times a day (BID) | ORAL | Status: DC | PRN

## 2011-04-14 NOTE — Telephone Encounter (Signed)
rx sent. Pt aware.[name], CMA  

## 2011-04-14 NOTE — Telephone Encounter (Signed)
Per CY-ok to refill Meclizine 25 mg #30 take 1 po bid prn vertigo with 3 refills.

## 2011-04-19 ENCOUNTER — Ambulatory Visit: Payer: Self-pay | Admitting: Internal Medicine

## 2011-05-03 ENCOUNTER — Other Ambulatory Visit (HOSPITAL_COMMUNITY): Payer: Self-pay | Admitting: Internal Medicine

## 2011-05-03 DIAGNOSIS — Z1231 Encounter for screening mammogram for malignant neoplasm of breast: Secondary | ICD-10-CM

## 2011-05-11 ENCOUNTER — Ambulatory Visit (HOSPITAL_COMMUNITY)
Admission: RE | Admit: 2011-05-11 | Discharge: 2011-05-11 | Disposition: A | Discharge status: Home / Self Care | Payer: Medicare Other | Source: Ambulatory Visit | Attending: Internal Medicine | Admitting: Internal Medicine

## 2011-05-11 DIAGNOSIS — Z1231 Encounter for screening mammogram for malignant neoplasm of breast: Secondary | ICD-10-CM | POA: Insufficient documentation

## 2011-05-17 ENCOUNTER — Ambulatory Visit: Payer: Self-pay | Admitting: Internal Medicine

## 2011-05-24 ENCOUNTER — Ambulatory Visit (INDEPENDENT_AMBULATORY_CARE_PROVIDER_SITE_OTHER): Payer: Medicare Other | Admitting: Internal Medicine

## 2011-05-24 ENCOUNTER — Encounter: Payer: Self-pay | Admitting: Internal Medicine

## 2011-05-24 ENCOUNTER — Ambulatory Visit (INDEPENDENT_AMBULATORY_CARE_PROVIDER_SITE_OTHER)
Admission: RE | Admit: 2011-05-24 | Discharge: 2011-05-24 | Disposition: A | Discharge status: Home / Self Care | Payer: Medicare Other | Source: Ambulatory Visit | Attending: Internal Medicine | Admitting: Internal Medicine

## 2011-05-24 ENCOUNTER — Other Ambulatory Visit (INDEPENDENT_AMBULATORY_CARE_PROVIDER_SITE_OTHER): Payer: Medicare Other

## 2011-05-24 VITALS — BP 118/70 | HR 67 | Ht 64.0 in | Wt 145.0 lb

## 2011-05-24 DIAGNOSIS — J309 Allergic rhinitis, unspecified: Secondary | ICD-10-CM

## 2011-05-24 DIAGNOSIS — L299 Pruritus, unspecified: Secondary | ICD-10-CM

## 2011-05-24 DIAGNOSIS — J40 Bronchitis, not specified as acute or chronic: Secondary | ICD-10-CM

## 2011-05-24 LAB — CBC WITH DIFFERENTIAL/PLATELET
Eosinophils Absolute: 0.2 10*3/uL (ref 0.0–0.7)
Eosinophils Relative: 3.1 % (ref 0.0–5.0)
HCT: 35.8 % — ABNORMAL LOW (ref 36.0–46.0)
Lymphs Abs: 2 10*3/uL (ref 0.7–4.0)
MCHC: 34.2 g/dL (ref 30.0–36.0)
MCV: 93.1 fl (ref 78.0–100.0)
Monocytes Absolute: 0.4 10*3/uL (ref 0.1–1.0)
Neutrophils Relative %: 48.8 % (ref 43.0–77.0)
Platelets: 217 10*3/uL (ref 150.0–400.0)
WBC: 5 10*3/uL (ref 4.5–10.5)

## 2011-05-24 LAB — SEDIMENTATION RATE: Sed Rate: 20 mm/hr (ref 0–22)

## 2011-05-24 NOTE — Assessment & Plan Note (Addendum)
Crackles on chest exam in context of macrobid concerning for fibrosis as we look for changes that might relate to her primary complaint of itching.  She emphatically doubts dry skin as the explanation.

## 2011-05-24 NOTE — Progress Notes (Signed)
  Subjective:    Patient ID: [name], female    DOB: Feb 21, 1922, 77 y.o.   MRN: [id number]  HPI 05/24/11- 11 yoF never smoker followed for allergic rhinitis,  C/o generalized itching with no rash- for months to a year. Saw PCP and dermatologist. They gave "oily meds", topically , and she has been taking a daily "allergy pill" No new meds.  Has loop recorder implanted.  Nasal symptoms have been adequate controlled.  Review of Systems Constitutional:   No-   weight loss, night sweats, fevers, chills, fatigue, lassitude. HEENT:   No-  headaches, difficulty swallowing, tooth/dental problems, sore throat,       No-  sneezing, itching, ear ache, nasal congestion, post nasal drip,  CV:  No-   chest pain, orthopnea, PND, swelling in lower extremities, anasarca, dizziness, palpitations Resp: No-   shortness of breath with exertion or at rest.              No-   productive cough,  No non-productive cough,  No-  coughing up of blood.              No-   change in color of mucus.  No- wheezing.   Skin: No-   rash or lesions.  + Pruritus GI:  No-   heartburn, indigestion, abdominal pain, nausea, vomiting, diarrhea,                 change in bowel habits, loss of appetite GU: No-   dysuria, change in color of urine, no urgency or frequency.  No- flank pain. MS:  No-   joint pain or swelling.  No- decreased range of motion.  No- back pain. Neuro- grossly normal to observation, Or:  Psych:  No- change in mood or affect. No depression or anxiety.  No memory loss.      Objective:   Physical Exam General- Alert, Oriented, Affect-appropriate, Distress- none acute Skin- rash-none, lesions- none, excoriation- none  Lymphadenopathy- none Head- atraumatic            Eyes- Gross vision intact, PERRLA, conjunctivae clear secretions            Ears- Hearing, canals normal            Nose- Clear, No-Septal dev, mucus, polyps, erosion, perforation             Throat- Mallampati II , mucosa clear ,  drainage- none, tonsils- atrophic Neck- flexible , trachea midline, no stridor , thyroid nl, carotid no bruit Chest - symmetrical excursion , unlabored           Heart/CV- RRR , 2/6 systolic murmur , no gallop  , no rub, nl s1 s2                           - JVD- none , edema- none, stasis changes- none, varices- none           Lung- crackles left base, wheeze- none, cough- none , dullness-none, rub- none           Chest wall-  Abd- tender-no, distended-no, bowel sounds-present, HSM- no Br/ Gen/ Rectal- Not done, not indicated Extrem- cyanosis- none, clubbing, none, atrophy- none, strength- nl Neuro- grossly intact to observation         Assessment & Plan:

## 2011-05-24 NOTE — Assessment & Plan Note (Signed)
Continued complaint of drainage. We will update profile and IgE status

## 2011-05-24 NOTE — Patient Instructions (Signed)
Orders- CXR- dx bronchitis              Lab- CBC w/ diff, allergy profile, sed rate    Suggest:  1) fexofenadine 180 (Allegra) otc antihistamine- one daily, instead of cetirizine/ zyrtec       2) Pepcid/ famotidine  20 mg- 1 daily-  Sold as a stomach acid blocker, but this is a different kind of antihistamine. Take one of each of these, once daily

## 2011-05-26 LAB — ALLERGY FULL PROFILE
Allergen, D pternoyssinus,d7: <0.1 kU/L (ref <0.35)
Allergen,Goose feathers, e70: <0.1 kU/L (ref <0.35)
Bermuda Grass: <0.1 kU/L (ref <0.35)
Box Elder IgE: <0.1 kU/L (ref <0.35)
Candida Albicans: <0.1 kU/L (ref <0.35)
D. farinae: <0.1 kU/L (ref <0.35)
Elm IgE: <0.1 kU/L (ref <0.35)
G005 Rye, Perennial: <0.1 kU/L (ref <0.35)
G009 Red Top: <0.1 kU/L (ref <0.35)
House Dust Hollister: <0.1 kU/L (ref <0.35)
IgE (Immunoglobulin E), Serum: 4.7 IU/mL (ref 0.0–180.0)
Oak: <0.1 kU/L (ref <0.35)
Stemphylium Botryosum: <0.1 kU/L (ref <0.35)
Timothy Grass: <0.1 kU/L (ref <0.35)

## 2011-05-30 NOTE — Progress Notes (Signed)
Quick Note:  LMTCB ______ 

## 2011-05-31 ENCOUNTER — Telehealth: Payer: Self-pay | Admitting: Internal Medicine

## 2011-05-31 NOTE — Telephone Encounter (Signed)
CBC- borderline anemic. Allergy markers are not abnormal.      LMOMTCB x 1

## 2011-05-31 NOTE — Telephone Encounter (Signed)
Returning call.  [phone]

## 2011-05-31 NOTE — Telephone Encounter (Signed)
Pt advised. Jennifer Castillo, CMA  

## 2011-06-28 ENCOUNTER — Ambulatory Visit: Payer: Medicare Other | Admitting: Internal Medicine

## 2011-09-21 ENCOUNTER — Other Ambulatory Visit: Payer: Self-pay | Admitting: Dermatology

## 2011-10-16 ENCOUNTER — Ambulatory Visit (INDEPENDENT_AMBULATORY_CARE_PROVIDER_SITE_OTHER): Payer: Medicare Other

## 2011-10-16 DIAGNOSIS — N390 Urinary tract infection, site not specified: Secondary | ICD-10-CM

## 2011-10-16 DIAGNOSIS — R350 Frequency of micturition: Secondary | ICD-10-CM

## 2012-01-04 ENCOUNTER — Other Ambulatory Visit: Payer: Self-pay | Admitting: Cardiovascular Disease

## 2012-01-04 DIAGNOSIS — Z01810 Encounter for preprocedural cardiovascular examination: Secondary | ICD-10-CM

## 2012-01-06 ENCOUNTER — Encounter (HOSPITAL_COMMUNITY): Payer: Self-pay | Admitting: Pharmacy Technician

## 2012-01-11 MED ORDER — CEFAZOLIN SODIUM 1-5 GM-% IV SOLN
1.0000 g | INTRAVENOUS | Status: AC
  Filled 2012-01-11: qty 50

## 2012-01-11 MED ORDER — CHLORHEXIDINE GLUCONATE 4 % EX LIQD
60.0000 mL | Freq: Once | CUTANEOUS | Status: AC
  Administered 2012-01-12: 4 via TOPICAL
  Filled 2012-01-11: qty 60

## 2012-01-11 MED ORDER — SODIUM CHLORIDE 0.9 % IR SOLN
80.0000 mg | Status: AC
  Filled 2012-01-11: qty 2

## 2012-01-11 MED ORDER — SODIUM CHLORIDE 0.9 % IV SOLN
INTRAVENOUS | Status: DC
  Administered 2012-01-12: 13:00:00 via INTRAVENOUS

## 2012-01-12 ENCOUNTER — Encounter (HOSPITAL_COMMUNITY)
Admission: RE | Disposition: A | Discharge status: Home / Self Care | Payer: Self-pay | Source: Ambulatory Visit | Attending: Cardiovascular Disease

## 2012-01-12 ENCOUNTER — Ambulatory Visit (HOSPITAL_COMMUNITY)
Admission: RE | Admit: 2012-01-12 | Discharge: 2012-01-12 | Disposition: A | Discharge status: Home / Self Care | Payer: Medicare Other | Source: Ambulatory Visit | Attending: Cardiovascular Disease | Admitting: Cardiovascular Disease

## 2012-01-12 ENCOUNTER — Encounter (HOSPITAL_COMMUNITY): Payer: Medicare Other

## 2012-01-12 DIAGNOSIS — E785 Hyperlipidemia, unspecified: Secondary | ICD-10-CM | POA: Insufficient documentation

## 2012-01-12 DIAGNOSIS — Z7982 Long term (current) use of aspirin: Secondary | ICD-10-CM | POA: Insufficient documentation

## 2012-01-12 DIAGNOSIS — Z01818 Encounter for other preprocedural examination: Secondary | ICD-10-CM | POA: Insufficient documentation

## 2012-01-12 DIAGNOSIS — R55 Syncope and collapse: Secondary | ICD-10-CM | POA: Insufficient documentation

## 2012-01-12 DIAGNOSIS — L299 Pruritus, unspecified: Secondary | ICD-10-CM | POA: Insufficient documentation

## 2012-01-12 DIAGNOSIS — Z01810 Encounter for preprocedural cardiovascular examination: Secondary | ICD-10-CM

## 2012-01-12 DIAGNOSIS — Z4509 Encounter for adjustment and management of other cardiac device: Secondary | ICD-10-CM | POA: Insufficient documentation

## 2012-01-12 DIAGNOSIS — Z79899 Other long term (current) drug therapy: Secondary | ICD-10-CM | POA: Insufficient documentation

## 2012-01-12 HISTORY — PX: LOOP RECORDER EXPLANT: SHX5476

## 2012-01-12 HISTORY — PX: TIBIAL TUBERCLERPLASTY: SHX5953

## 2012-01-12 SURGERY — LOOP RECORDER EXPLANT
Anesthesia: LOCAL

## 2012-01-12 MED ORDER — SODIUM CHLORIDE 0.9 % IV SOLN: INTRAVENOUS | Status: DC

## 2012-01-12 MED ORDER — ACETAMINOPHEN 325 MG PO TABS
325.0000 mg | ORAL_TABLET | ORAL | Status: DC | PRN
  Filled 2012-01-12: qty 2

## 2012-01-12 MED ORDER — LIDOCAINE HCL (PF) 1 % IJ SOLN
INTRAMUSCULAR | Status: AC
  Filled 2012-01-12: qty 60

## 2012-01-12 MED ORDER — HYDROCODONE-ACETAMINOPHEN 5-325 MG PO TABS: 1.0000 | ORAL_TABLET | ORAL | Status: DC | PRN

## 2012-01-12 MED ORDER — ONDANSETRON HCL 4 MG/2ML IJ SOLN: 4.0000 mg | Freq: Four times a day (QID) | INTRAMUSCULAR | Status: DC | PRN

## 2012-01-12 MED ORDER — MIDAZOLAM HCL 2 MG/2ML IJ SOLN
INTRAMUSCULAR | Status: AC
  Filled 2012-01-12: qty 2

## 2012-01-12 MED ORDER — FENTANYL CITRATE 0.05 MG/ML IJ SOLN
INTRAMUSCULAR | Status: AC
  Filled 2012-01-12: qty 2

## 2012-01-12 NOTE — H&P (Signed)
History reviewed, patient examined, no change in status, stable for surgery.  Thurmon Fair, MD, [facility] [phone] office [phone] pager 01/12/2012 1:53 PM

## 2012-01-12 NOTE — Discharge Instructions (Signed)
Supplemental Discharge Instructions for  Pacemaker/Defibrillator Patients  WOUND CARE   Keep the wound area clean and dry.  Remove the dressing the day after you return home (usually 48 hours after the procedure).   DO NOT SUBMERGE UNDER WATER UNTIL FULLY HEALED (no tub baths, hot tubs, swimming pools, etc.).    You  may shower or take a sponge bath after the dressing is removed. DO NOT SOAK the area and do not allow the shower to directly spray on the site.   If you have staples, these will be removed in the office in 7-14 days.   If you have tape/steri-strips on your wound, these will fall off; do not pull them off prematurely.     No bandage is needed on the site.  DO  NOT apply any creams, oils, or ointments to the wound area.   If you notice any drainage or discharge from the wound, any swelling, excessive redness or bruising at the site, or if you develop a fever > 101? F after you are discharged home, call the office at once.

## 2012-02-19 NOTE — CV Procedure (Signed)
[name], [place] Female, 78 y.o., 1922-04-03  MRN: [id number]  CSN: [id number] 01/10/2012   LOOP RECORDER EXPLANT   Procedure report  Procedure performed:  1. Loop recorder explantation  2. Light sedation  Reason for procedure:  1. Device generator at end of service  Procedure performed by:  Thurmon Fair, MD  Complications:  None  Estimated blood loss:  <5 mL  Medications administered during procedure:  Ancef 1 g intravenously,lidocaine 1% 20 mL locally,  Device details:  Medtronic Reveal XT Procedure details:  After the risks and benefits of the procedure were discussed the patient provided informed consent. She was brought to the cardiac catheter lab in the fasting state. The patient was prepped and draped in usual sterile fashion. Local anesthesia with 1% lidocaine was administered to to the left parasternal area. A 3 cm horizontal incision was made and using electrocautery and mostly sharp and blunt dissection the pocket was opened with careful attention to hemostasis. The device was removed without difficulty. The pocket was flushed with copious amounts of antibiotic solution, then reinspected. The pocket was then closed in layers using 2 layers of 2-0 Vicryl and cutaneous staples after which a sterile dressing was applied.   Thurmon Fair, MD, [facility] [phone] office [phone] pager

## 2012-02-26 ENCOUNTER — Other Ambulatory Visit: Payer: Self-pay | Admitting: Family Medicine

## 2012-04-05 ENCOUNTER — Other Ambulatory Visit: Payer: Self-pay | Admitting: Internal Medicine

## 2012-04-05 NOTE — Telephone Encounter (Signed)
LAST OV:05-2011 WAS TO FOLLOW UP IN 1 MONTH 06-2011-NEVER CAME IN  Please advise if okay to refill. Thanks.

## 2012-04-05 NOTE — Telephone Encounter (Signed)
Ok to refill 

## 2012-05-21 ENCOUNTER — Other Ambulatory Visit (HOSPITAL_COMMUNITY): Payer: Self-pay | Admitting: Internal Medicine

## 2012-05-21 DIAGNOSIS — Z1231 Encounter for screening mammogram for malignant neoplasm of breast: Secondary | ICD-10-CM

## 2012-05-28 ENCOUNTER — Encounter: Payer: Self-pay | Admitting: Internal Medicine

## 2012-05-28 ENCOUNTER — Other Ambulatory Visit (INDEPENDENT_AMBULATORY_CARE_PROVIDER_SITE_OTHER): Payer: Medicare Other

## 2012-05-28 ENCOUNTER — Ambulatory Visit (INDEPENDENT_AMBULATORY_CARE_PROVIDER_SITE_OTHER): Payer: Medicare Other | Admitting: Internal Medicine

## 2012-05-28 VITALS — BP 132/78 | HR 82 | Ht 64.0 in | Wt 156.8 lb

## 2012-05-28 DIAGNOSIS — R300 Dysuria: Secondary | ICD-10-CM

## 2012-05-28 DIAGNOSIS — L299 Pruritus, unspecified: Secondary | ICD-10-CM

## 2012-05-28 DIAGNOSIS — J310 Chronic rhinitis: Secondary | ICD-10-CM

## 2012-05-28 LAB — URINALYSIS, ROUTINE W REFLEX MICROSCOPIC
Bilirubin Urine: NEGATIVE
Hgb urine dipstick: NEGATIVE
Total Protein, Urine: NEGATIVE
Urine Glucose: NEGATIVE
Urobilinogen, UA: 0.2 (ref 0.0–1.0)

## 2012-05-28 MED ORDER — FAMOTIDINE 20 MG PO TABS: 20.0000 mg | ORAL_TABLET | Freq: Every evening | ORAL | Status: DC | PRN

## 2012-05-28 MED ORDER — CHLORPHENIRAMINE MALEATE 4 MG PO TABS: ORAL_TABLET | ORAL | Status: DC

## 2012-05-28 MED ORDER — PIMECROLIMUS 1 % EX CREA: TOPICAL_CREAM | Freq: Two times a day (BID) | CUTANEOUS | Status: AC

## 2012-05-28 NOTE — Progress Notes (Signed)
  Subjective:    Patient ID: [name], female    DOB: January 31, 1922, 78 y.o.   MRN: [id number]  HPI 05/24/11- 78 yoF never smoker followed for allergic rhinitis,  C/o generalized itching with no rash- for months to a year. Saw PCP and dermatologist. They gave "oily meds", topically , and she has been taking a daily "allergy pill" No new meds.  Has loop recorder implanted.  Nasal symptoms have been adequate controlled.  05/28/12- 78 yoF never smoker followed for allergic rhinitis, Still having itchy feelings all over; never has rash or hives that come up. Pruritus times one year without visible rash. Discussed possible relation to her pain medications. Allergy profile 05/24/2011-negative with total IgE 4.7. No relief from cetirizine or fexofenadine. Liver functions okay. She reports dysuria and asks if we could check a urinalysis while she is here for possible UTI.  Review of Systems Constitutional:   No-   weight loss, night sweats, fevers, chills, fatigue, lassitude. HEENT:   No-  headaches, difficulty swallowing, tooth/dental problems, sore throat,       No-  sneezing, itching, ear ache, nasal congestion, post nasal drip,  CV:  No-   chest pain, orthopnea, PND, swelling in lower extremities, anasarca, dizziness, palpitations Resp: No-   shortness of breath with exertion or at rest.              No-   productive cough,  No non-productive cough,  No-  coughing up of blood.              No-   change in color of mucus.  No- wheezing.   Skin: No-   rash or lesions.  + Pruritus GI:  No-   heartburn, indigestion, abdominal pain, nausea, vomiting,  GU: +  dysuria, No-change in color of urine, no urgency or frequency.  No- flank pain. MS:  No-   joint pain or swelling.   Neuro- nothing unusual  Psych:  No- change in mood or affect. No depression or anxiety.  No memory loss. Objective:   Physical Exam General- Alert, Oriented, Affect-appropriate, Distress- none acute Skin- rash-none,  lesions- none, excoriation- none Lymphadenopathy- none Head- atraumatic            Eyes- Gross vision intact, PERRLA, conjunctivae clear secretions            Ears- Hearing, canals normal            Nose- Clear, No-Septal dev, mucus, polyps, erosion, perforation             Throat- Mallampati II , mucosa clear , drainage- none, tonsils- atrophic Neck- flexible , trachea midline, no stridor , thyroid nl, carotid no bruit Chest - symmetrical excursion , unlabored           Heart/CV- RRR , 2/6 systolic murmur , no gallop  , no rub, nl s1 s2                           - JVD- none , edema- none, stasis changes- none, varices- none           Lung- crackles left base, wheeze- none, cough- none , dullness-none, rub- none           Chest wall-  Abd-  Br/ Gen/ Rectal- Not done, not indicated Extrem- cyanosis- none, clubbing, none, atrophy- none, strength- nl Neuro- grossly intact to observation Assessment & Plan:

## 2012-05-28 NOTE — Patient Instructions (Addendum)
Order- U/A  Dx dysuria  Scripts for pepcid and chlortrimeton to try together for at least a week to see it they help your itching  Script for Elidel cream to try on an area for at least several days, to see if it helps your itching.

## 2012-05-29 NOTE — Progress Notes (Signed)
Quick Note:  Pt aware of results. ______ 

## 2012-06-03 NOTE — Assessment & Plan Note (Signed)
Nonspecific and not related to IgE. No underlying disease has revealed itself. Plan-dual antihistamine coverage with Pepcid and Chlor-Trimeton. Small tube of Elidel 1% cream for trial. She will pick one area to treat

## 2012-06-03 NOTE — Assessment & Plan Note (Signed)
Atopic allergic rhinitis is not expected at her age and not indicated by results of allergy profile. I discussed this as related to dryness and vasomotor type irritation.

## 2012-06-06 ENCOUNTER — Ambulatory Visit (HOSPITAL_COMMUNITY)
Admission: RE | Admit: 2012-06-06 | Discharge: 2012-06-06 | Disposition: A | Discharge status: Home / Self Care | Payer: Medicare Other | Source: Ambulatory Visit | Attending: Internal Medicine | Admitting: Internal Medicine

## 2012-06-06 DIAGNOSIS — Z1231 Encounter for screening mammogram for malignant neoplasm of breast: Secondary | ICD-10-CM

## 2012-09-24 ENCOUNTER — Telehealth: Payer: Self-pay | Admitting: Internal Medicine

## 2012-09-24 MED ORDER — IPRATROPIUM BROMIDE 0.03 % NA SOLN: 2.0000 | Freq: Two times a day (BID) | NASAL | Status: DC

## 2012-09-24 NOTE — Telephone Encounter (Signed)
Rx has been sent to [facility]. Pt is aware.

## 2012-10-06 ENCOUNTER — Ambulatory Visit (INDEPENDENT_AMBULATORY_CARE_PROVIDER_SITE_OTHER): Payer: Medicare Other | Admitting: Family Medicine

## 2012-10-06 VITALS — BP 185/84 | HR 64 | Temp 98.0°F | Resp 18 | Ht 62.0 in | Wt 162.0 lb

## 2012-10-06 DIAGNOSIS — N3090 Cystitis, unspecified without hematuria: Secondary | ICD-10-CM

## 2012-10-06 DIAGNOSIS — R300 Dysuria: Secondary | ICD-10-CM

## 2012-10-06 DIAGNOSIS — R350 Frequency of micturition: Secondary | ICD-10-CM

## 2012-10-06 LAB — POCT UA - MICROSCOPIC ONLY
Crystals, Ur, HPF, POC: NEGATIVE
RBC, urine, microscopic: NEGATIVE

## 2012-10-06 LAB — POCT URINALYSIS DIPSTICK
Bilirubin, UA: NEGATIVE
Glucose, UA: NEGATIVE
Nitrite, UA: NEGATIVE
Urobilinogen, UA: 0.2

## 2012-10-06 MED ORDER — CIPROFLOXACIN HCL 250 MG PO TABS: 250.0000 mg | ORAL_TABLET | Freq: Two times a day (BID) | ORAL | Status: DC

## 2012-10-06 NOTE — Progress Notes (Signed)
Subjective: 78 year old lady who comes in with symptoms of urinary tract infection. These began last night with urinary frequency and burning. She has had UTIs in the past and knows what her symptoms are. She has not been running any fever. No flank pain. Lives at friend's home.Drove herself out here.  She would prefer Cipro to taking a sulfa drug or something else. She has tolerated well in the past.  Objective: No CVA tenderness Abdomen soft    Results for orders placed in visit on 10/06/12  POCT URINALYSIS DIPSTICK      Component Value Range   Color, UA yellow     Clarity, UA clear     Glucose, UA neg     Bilirubin, UA neg     Ketones, UA neg     Spec Grav, UA 1.015     Blood, UA small     pH, UA 7.0     Protein, UA 30     Urobilinogen, UA 0.2     Nitrite, UA neg     Leukocytes, UA moderate (2+)    POCT UA - MICROSCOPIC ONLY      Component Value Range   WBC, Ur, HPF, POC 20-30     RBC, urine, microscopic neg     Bacteria, U Microscopic trace     Mucus, UA neg     Epithelial cells, urine per micros 0-2     Crystals, Ur, HPF, POC neg     Casts, Ur, LPF, POC neg     Yeast, UA neg     Assessment: Urinary tract infection  Plan: Treat with antibiotics. Encourage fluids.

## 2012-10-06 NOTE — Patient Instructions (Signed)
Drink lots of fluids  If symptoms persist after the treatment course return or contact us for additional medicine.

## 2012-10-08 LAB — URINE CULTURE: Colony Count: >=100000

## 2012-10-09 ENCOUNTER — Telehealth: Payer: Self-pay

## 2012-10-09 DIAGNOSIS — N3090 Cystitis, unspecified without hematuria: Secondary | ICD-10-CM

## 2012-10-09 NOTE — Telephone Encounter (Signed)
Patient sates that she was in Saturday to see Dr Alwyn Ren for a UTI and finished her medicine and is still having symptoms and is wanting to know if he would [facility] [address].   Best#: [phone]   [facility] [address]

## 2012-10-09 NOTE — Telephone Encounter (Signed)
Patient has called again, since Dr Alwyn Ren will not be in until Friday she is hoping another provider can call something in for her.

## 2012-10-10 MED ORDER — CIPROFLOXACIN HCL 250 MG PO TABS: 250.0000 mg | ORAL_TABLET | Freq: Two times a day (BID) | ORAL | Status: DC

## 2012-10-10 NOTE — Telephone Encounter (Signed)
I sent 3 more days worth of antibiotics.  Tell her to make sure she is drinking at least 60-80 ounces of water daily.

## 2012-10-11 NOTE — Telephone Encounter (Signed)
Called pt to advise. She has been trying to drink more water, will continue to do this.

## 2012-11-09 ENCOUNTER — Telehealth: Payer: Self-pay | Admitting: Internal Medicine

## 2012-11-09 NOTE — Telephone Encounter (Signed)
Called and spoke with pt and she stated that she would like to see CY next week sometime.  Pt c/o  Clear congestion, ear pressure and this has been getting worse.  Pt stated that she does take an antihistamine at bedtime but she still gets up with a stuffy head.  Has a few other things about her allergies that she would like to discuss with CY at this ov.  Please advise.  Thanks  No Known Allergies   Last ov--05/28/2012 Next ov--none pending

## 2012-11-09 NOTE — Telephone Encounter (Signed)
Spoke with patient-she will be here Monday 11-12-12 at 2pm to see CY.

## 2012-11-12 ENCOUNTER — Other Ambulatory Visit (INDEPENDENT_AMBULATORY_CARE_PROVIDER_SITE_OTHER): Payer: Medicare Other

## 2012-11-12 ENCOUNTER — Encounter: Payer: Self-pay | Admitting: Internal Medicine

## 2012-11-12 ENCOUNTER — Ambulatory Visit (INDEPENDENT_AMBULATORY_CARE_PROVIDER_SITE_OTHER): Payer: Medicare Other | Admitting: Internal Medicine

## 2012-11-12 VITALS — BP 140/70 | HR 83 | Ht 64.0 in | Wt 155.2 lb

## 2012-11-12 DIAGNOSIS — N390 Urinary tract infection, site not specified: Secondary | ICD-10-CM

## 2012-11-12 DIAGNOSIS — J310 Chronic rhinitis: Secondary | ICD-10-CM

## 2012-11-12 DIAGNOSIS — L299 Pruritus, unspecified: Secondary | ICD-10-CM

## 2012-11-12 LAB — URINALYSIS
Hgb urine dipstick: NEGATIVE
Leukocytes, UA: NEGATIVE
Urine Glucose: NEGATIVE
Urobilinogen, UA: 0.2 (ref 0.0–1.0)

## 2012-11-12 MED ORDER — PHENYLEPHRINE HCL 1 % NA SOLN: 3.0000 [drp] | Freq: Once | NASAL | Status: DC

## 2012-11-12 MED ORDER — METHYLPREDNISOLONE ACETATE 80 MG/ML IJ SUSP
80.0000 mg | Freq: Once | INTRAMUSCULAR | Status: AC
  Administered 2012-11-12: 80 mg via INTRAMUSCULAR

## 2012-11-12 NOTE — Patient Instructions (Addendum)
Instead of Zyrtec try otc antihistamine loratadine/ Claritin  Skin lotions to moisturize may help  Neb neo nasal  Depo 80  Order lab- U/A/ UDIP   Dx cystitis

## 2012-11-12 NOTE — Progress Notes (Signed)
Subjective:    Patient ID: [name], female    DOB: 02/02/1922, 79 y.o.   MRN: [id number]  HPI 05/24/11- 15 yoF never smoker followed for allergic rhinitis,  C/o generalized itching with no rash- for months to a year. Saw PCP and dermatologist. They gave "oily meds", topically , and she has been taking a daily "allergy pill" No new meds.  Has loop recorder implanted.  Nasal symptoms have been adequate controlled.  05/28/12- 89 yoF never smoker followed for allergic rhinitis, Still having itchy feelings all over; never has rash or hives that come up. Pruritus times one year without visible rash. Discussed possible relation to her pain medications. Allergy profile 05/24/2011-negative with total IgE 4.7. No relief from cetirizine or fexofenadine. Liver functions okay. She reports dysuria and asks if we could check a urinalysis while she is here for possible UTI.  11/12/12- 89 yoF never smoker followed for allergic rhinitis, Follows For: Runny nose - Dizziness - Ears feel full - Sneezing in am's - Still itching all over - Pt thinks she may have UTI - would like ua checked This morning head feels "unsteady", congested nose and ears, increased postnasal drip. Still itching all over her scalp and chest. No rash. While here she asks if we could check for possible UTI-some burning.  Review of Systems- see HPI Constitutional:   No-   weight loss, night sweats, fevers, chills, fatigue, lassitude. HEENT:   No-  headaches, difficulty swallowing, tooth/dental problems, sore throat,       No-  Sneezing,+ itching,+ear ache, +nasal congestion, +post nasal drip,  CV:  No-   chest pain, orthopnea, PND, swelling in lower extremities, anasarca, dizziness, palpitations Resp: No-   shortness of breath with exertion or at rest.              No-   productive cough,  No non-productive cough,  No-  coughing up of blood.              No-   change in color of mucus.  No- wheezing.   Skin: No-   rash or  lesions.  + Pruritus GI:  No-   heartburn, indigestion, abdominal pain, nausea, vomiting,  GU: +  dysuria, No-change in color of urine, no urgency or frequency.  MS:  No-   joint pain or swelling.   Neuro- nothing unusual  Psych:  No- change in mood or affect. No depression or anxiety.  No memory loss. Objective:   Physical Exam General- Alert, Oriented, Affect-appropriate, Distress- none acute. Looks normal and comfortable for age. Skin- rash-none, lesions- none, excoriation- none Lymphadenopathy- none Head- atraumatic            Eyes- Gross vision intact, PERRLA, conjunctivae clear secretions            Ears- Hearing, canals normal            Nose- Clear, No-Septal dev, mucus, polyps, erosion, perforation             Throat- Mallampati II , mucosa clear , drainage- none, tonsils- atrophic Neck- flexible , trachea midline, no stridor , thyroid nl, carotid no bruit Chest - symmetrical excursion , unlabored           Heart/CV- RRR , 2/6 systolic murmur , no gallop  , no rub, nl s1 s2                           -  JVD- none , edema- none, stasis changes- none, varices- none           Lung- +crackles left base, wheeze- none, cough- none , dullness-none, rub- none           Chest wall-  Abd-  Br/ Gen/ Rectal- Not done, not indicated Extrem- cyanosis- none, clubbing, none, atrophy- none, strength- nl Neuro- grossly intact to observation Assessment & Plan:

## 2012-11-22 NOTE — Assessment & Plan Note (Signed)
Increased nasal congestion at this time of year is likely an upper respiratory infection. Plan-nebulizer decongestant, Depo-Medrol. Suggested loratadine.

## 2012-11-22 NOTE — Assessment & Plan Note (Signed)
Dysuria feels to her like previous urinary infections. As a courtesy we will send urine specimen, but  asked that she turn to her primary physician for treatment if needed.

## 2012-11-22 NOTE — Assessment & Plan Note (Signed)
Very nonspecific. We discussed dry skin, drug reaction as common possibilities. Plan-watch effect of Depo-Medrol and loratadine

## 2012-12-15 ENCOUNTER — Ambulatory Visit (INDEPENDENT_AMBULATORY_CARE_PROVIDER_SITE_OTHER): Payer: Medicare Other | Admitting: Internal Medicine

## 2012-12-15 VITALS — BP 153/82 | HR 81 | Temp 98.3°F | Resp 17 | Ht 62.5 in | Wt 145.8 lb

## 2012-12-15 DIAGNOSIS — N390 Urinary tract infection, site not specified: Secondary | ICD-10-CM

## 2012-12-15 DIAGNOSIS — J019 Acute sinusitis, unspecified: Secondary | ICD-10-CM

## 2012-12-15 DIAGNOSIS — R300 Dysuria: Secondary | ICD-10-CM

## 2012-12-15 LAB — POCT URINALYSIS DIPSTICK
Protein, UA: >=300
Spec Grav, UA: 1.015
Urobilinogen, UA: 0.2
pH, UA: >=9

## 2012-12-15 MED ORDER — AMOXICILLIN 500 MG PO CAPS: 1000.0000 mg | ORAL_CAPSULE | Freq: Two times a day (BID) | ORAL | Status: DC

## 2012-12-15 NOTE — Progress Notes (Signed)
  Subjective:    Patient ID: [name], female    DOB: 08/20/1922, 79 y.o.   MRN: [id number]  HPI Has head, sinus, nasal congestion, and cough with no chest congestion or sob. Also has new frequency and urgency with incontinence.   Review of Systems     Objective:   Physical Exam  Vitals reviewed. Constitutional: She is oriented to person, place, and time. She appears well-developed and well-nourished.  HENT:  Right Ear: Tympanic membrane is retracted. Decreased hearing is noted.  Left Ear: Tympanic membrane is retracted. Decreased hearing is noted.  Nose: Mucosal edema and rhinorrhea present. Right sinus exhibits maxillary sinus tenderness. Left sinus exhibits maxillary sinus tenderness.  Eyes: EOM are normal.  Cardiovascular: Normal rate.   Pulmonary/Chest: Effort normal and breath sounds normal. No respiratory distress.  Neurological: She is alert and oriented to person, place, and time. Coordination normal.  Psychiatric: She has a normal mood and affect. Her behavior is normal. Judgment and thought content normal.   Using walker   Results for orders placed in visit on 12/15/12  POCT URINALYSIS DIPSTICK      Result Value Range   Color, UA       Clarity, UA       Glucose, UA neg     Bilirubin, UA neg     Ketones, UA nge     Spec Grav, UA 1.015     Blood, UA moderate     pH, UA >=9.0     Protein, UA >=300     Urobilinogen, UA 0.2     Nitrite, UA neg     Leukocytes, UA Trace    this specimen contaminated with suppository.     Assessment & Plan:  Sinusitis UTI Amoxil 1g bid

## 2012-12-15 NOTE — Patient Instructions (Addendum)
Urinary Tract Infection A urinary tract infection (UTI) is often caused by a germ (bacteria). A UTI is usually helped with medicine (antibiotics) that kills germs. Take all the medicine until it is gone. Do this even if you are feeling better. You are usually better in 7 to 10 days. HOME CARE   Drink enough water and fluids to keep your pee (urine) clear or pale yellow. Drink:  Cranberry juice.  Water.  Avoid:  Caffeine.  Tea.  Bubbly (carbonated) drinks.  Alcohol.  Only take medicine as told by your doctor.  To prevent further infections:  Pee often.  After pooping (bowel movement), women should wipe from front to back. Use each tissue only once.  Pee before and after having sex (intercourse). Ask your doctor when your test results will be ready. Make sure you follow up and get your test results.  GET HELP RIGHT AWAY IF:   There is very bad back pain or lower belly (abdominal) pain.  You get the chills.  You have a fever.  Your baby is older than 3 months with a rectal temperature of 102 F (38.9 C) or higher.  Your baby is 35 months old or younger with a rectal temperature of 100.4 F (38 C) or higher.  You feel sick to your stomach (nauseous) or throw up (vomit).  There is continued burning with peeing.  Your problems are not better in 3 days. Return sooner if you are getting worse. MAKE SURE YOU:   Understand these instructions.  Will watch your condition.  Will get help right away if you are not doing well or get worse. Document Released: 03/21/2008 Document Revised: 12/26/2011 Document Reviewed: 03/21/2008 [facility] Patient Information 2013 [place], [place]. Sinusitis Sinusitis is redness, soreness, and swelling (inflammation) of the paranasal sinuses. Paranasal sinuses are air pockets within the bones of your face (beneath the eyes, the middle of the forehead, or above the eyes). In healthy paranasal sinuses, mucus is able to drain out, and air is able  to circulate through them by way of your nose. However, when your paranasal sinuses are inflamed, mucus and air can become trapped. This can allow bacteria and other germs to grow and cause infection. Sinusitis can develop quickly and last only a short time (acute) or continue over a long period (chronic). Sinusitis that lasts for more than 12 weeks is considered chronic.  CAUSES  Causes of sinusitis include:  Allergies.  Structural abnormalities, such as displacement of the cartilage that separates your nostrils (deviated septum), which can decrease the air flow through your nose and sinuses and affect sinus drainage.  Functional abnormalities, such as when the small hairs (cilia) that line your sinuses and help remove mucus do not work properly or are not present. SYMPTOMS  Symptoms of acute and chronic sinusitis are the same. The primary symptoms are pain and pressure around the affected sinuses. Other symptoms include:  Upper toothache.  Earache.  Headache.  Bad breath.  Decreased sense of smell and taste.  A cough, which worsens when you are lying flat.  Fatigue.  Fever.  Thick drainage from your nose, which often is green and may contain pus (purulent).  Swelling and warmth over the affected sinuses. DIAGNOSIS  Your caregiver will perform a physical exam. During the exam, your caregiver may:  Look in your nose for signs of abnormal growths in your nostrils (nasal polyps).  Tap over the affected sinus to check for signs of infection.  View the inside of your  sinuses (endoscopy) with a special imaging device with a light attached (endoscope), which is inserted into your sinuses. If your caregiver suspects that you have chronic sinusitis, one or more of the following tests may be recommended:  Allergy tests.  Nasal culture A sample of mucus is taken from your nose and sent to a lab and screened for bacteria.  Nasal cytology A sample of mucus is taken from your nose  and examined by your caregiver to determine if your sinusitis is related to an allergy. TREATMENT  Most cases of acute sinusitis are related to a viral infection and will resolve on their own within 10 days. Sometimes medicines are prescribed to help relieve symptoms (pain medicine, decongestants, nasal steroid sprays, or saline sprays).  However, for sinusitis related to a bacterial infection, your caregiver will prescribe antibiotic medicines. These are medicines that will help kill the bacteria causing the infection.  Rarely, sinusitis is caused by a fungal infection. In theses cases, your caregiver will prescribe antifungal medicine. For some cases of chronic sinusitis, surgery is needed. Generally, these are cases in which sinusitis recurs more than 3 times per year, despite other treatments. HOME CARE INSTRUCTIONS   Drink plenty of water. Water helps thin the mucus so your sinuses can drain more easily.  Use a humidifier.  Inhale steam 3 to 4 times a day (for example, sit in the bathroom with the shower running).  Apply a warm, moist washcloth to your face 3 to 4 times a day, or as directed by your caregiver.  Use saline nasal sprays to help moisten and clean your sinuses.  Take over-the-counter or prescription medicines for pain, discomfort, or fever only as directed by your caregiver. SEEK IMMEDIATE MEDICAL CARE IF:  You have increasing pain or severe headaches.  You have nausea, vomiting, or drowsiness.  You have swelling around your face.  You have vision problems.  You have a stiff neck.  You have difficulty breathing. MAKE SURE YOU:   Understand these instructions.  Will watch your condition.  Will get help right away if you are not doing well or get worse. Document Released: 10/03/2005 Document Revised: 12/26/2011 Document Reviewed: 10/18/2011 [facility] Patient Information 2013 [place], [place].

## 2012-12-20 ENCOUNTER — Encounter: Payer: Self-pay | Admitting: Cardiology

## 2012-12-20 DIAGNOSIS — E785 Hyperlipidemia, unspecified: Secondary | ICD-10-CM | POA: Insufficient documentation

## 2012-12-20 DIAGNOSIS — Z87898 Personal history of other specified conditions: Secondary | ICD-10-CM | POA: Insufficient documentation

## 2012-12-28 ENCOUNTER — Ambulatory Visit (INDEPENDENT_AMBULATORY_CARE_PROVIDER_SITE_OTHER): Payer: Medicare Other | Admitting: Family Medicine

## 2012-12-28 VITALS — BP 138/66 | HR 87 | Temp 98.1°F | Resp 16 | Ht 62.0 in | Wt 153.0 lb

## 2012-12-28 DIAGNOSIS — J309 Allergic rhinitis, unspecified: Secondary | ICD-10-CM

## 2012-12-28 DIAGNOSIS — M545 Other chronic pain: Secondary | ICD-10-CM | POA: Insufficient documentation

## 2012-12-28 DIAGNOSIS — R350 Frequency of micturition: Secondary | ICD-10-CM

## 2012-12-28 DIAGNOSIS — G8929 Other chronic pain: Secondary | ICD-10-CM | POA: Insufficient documentation

## 2012-12-28 DIAGNOSIS — N390 Urinary tract infection, site not specified: Secondary | ICD-10-CM

## 2012-12-28 LAB — POCT URINALYSIS DIPSTICK
Blood, UA: NEGATIVE
Glucose, UA: 100
Ketones, UA: 15
Nitrite, UA: NEGATIVE
Protein, UA: 30
Spec Grav, UA: 1.025
Urobilinogen, UA: 1
pH, UA: 5

## 2012-12-28 LAB — POCT UA - MICROSCOPIC ONLY
Casts, Ur, LPF, POC: NEGATIVE
Crystals, Ur, HPF, POC: NEGATIVE
Mucus, UA: NEGATIVE
Yeast, UA: NEGATIVE

## 2012-12-28 MED ORDER — CIPROFLOXACIN HCL 250 MG PO TABS: 250.0000 mg | ORAL_TABLET | Freq: Two times a day (BID) | ORAL | Status: DC

## 2012-12-28 MED ORDER — MOMETASONE FUROATE 50 MCG/ACT NA SUSP: 2.0000 | Freq: Every day | NASAL | Status: DC

## 2012-12-28 NOTE — Patient Instructions (Addendum)
Urinary Tract Infection Urinary tract infections (UTIs) can develop anywhere along your urinary tract. Your urinary tract is your body's drainage system for removing wastes and extra water. Your urinary tract includes two kidneys, two ureters, a bladder, and a urethra. Your kidneys are a pair of bean-shaped organs. Each kidney is about the size of your fist. They are located below your ribs, one on each side of your spine. CAUSES Infections are caused by microbes, which are microscopic organisms, including fungi, viruses, and bacteria. These organisms are so small that they can only be seen through a microscope. Bacteria are the microbes that most commonly cause UTIs. SYMPTOMS  Symptoms of UTIs may vary by age and gender of the patient and by the location of the infection. Symptoms in young women typically include a frequent and intense urge to urinate and a painful, burning feeling in the bladder or urethra during urination. Older women and men are more likely to be tired, shaky, and weak and have muscle aches and abdominal pain. A fever may mean the infection is in your kidneys. Other symptoms of a kidney infection include pain in your back or sides below the ribs, nausea, and vomiting. DIAGNOSIS To diagnose a UTI, your caregiver will ask you about your symptoms. Your caregiver also will ask to provide a urine sample. The urine sample will be tested for bacteria and white blood cells. White blood cells are made by your body to help fight infection. TREATMENT  Typically, UTIs can be treated with medication. Because most UTIs are caused by a bacterial infection, they usually can be treated with the use of antibiotics. The choice of antibiotic and length of treatment depend on your symptoms and the type of bacteria causing your infection. HOME CARE INSTRUCTIONS  If you were prescribed antibiotics, take them exactly as your caregiver instructs you. Finish the medication even if you feel better after you  have only taken some of the medication.  Drink enough water and fluids to keep your urine clear or pale yellow.  Avoid caffeine, tea, and carbonated beverages. They tend to irritate your bladder.  Empty your bladder often. Avoid holding urine for long periods of time.  Empty your bladder before and after sexual intercourse.  After a bowel movement, women should cleanse from front to back. Use each tissue only once. SEEK MEDICAL CARE IF:   You have back pain.  You develop a fever.  Your symptoms do not begin to resolve within 3 days. SEEK IMMEDIATE MEDICAL CARE IF:   You have severe back pain or lower abdominal pain.  You develop chills.  You have nausea or vomiting.  You have continued burning or discomfort with urination. MAKE SURE YOU:   Understand these instructions.  Will watch your condition.  Will get help right away if you are not doing well or get worse. Document Released: 07/13/2005 Document Revised: 04/03/2012 Document Reviewed: 11/11/2011 [employer] Patient Information 2013 [employer].  

## 2012-12-28 NOTE — Progress Notes (Signed)
Patient ID: [name] MRN: [id number], DOB: 05-08-1922, 79 y.o. Date of Encounter: 12/28/2012, 2:19 PM  Primary Physician: Pearson Grippe, MD  Chief Complaint:  Chief Complaint  Patient presents with  . Urinary Frequency    about 2 weeks  . Nasal Congestion    recheck    HPI: 79 y.o. year old female presents with 2 day history of dysuria, urgency, and frequency. No hematuria.  No fever No sick contacts, recent antibiotics, or recent travels.   No vaginal discharge, back pain, fever  Patient also has seasonal allergies for which she's been taking a nasal spray which burns. Since this may more nasal congestion. She recently was treated for sinus infection and still has some nasal congestion for which the current nasal spray is not working.  Past Medical History  Diagnosis Date  . Deviated nasal septum   . Dysfunction of eustachian tube   . Unspecified sinusitis (chronic)   . Allergic rhinitis, cause unspecified   . Arthritis   . Cataract   . Osteoporosis   . Hx of syncope     had loop recorder for 2 years now explanted, only PACs noted on device for over 2 years, no a fib, Echo 08/18/09 EF >55%mild concentric LVHnormal myoview, normal carotid dopplers 2010  . Pruritus of skin     poss. related to Losartan  . Dyslipidemia   . Normal cardiac stress test     lexiscan myoview 04/08/08     Home Meds: Prior to Admission medications   Medication Sig Start Date End Date Taking? Authorizing Provider  aspirin 81 MG tablet Take 81 mg by mouth See admin instructions. Mon, Wed and Fri   Yes Historical Provider, MD  hydrocodone-acetaminophen (LORCET-HD) 5-500 MG per capsule Take 1 capsule by mouth every 6 (six) hours as needed.   Yes Historical Provider, MD  levothyroxine (SYNTHROID, LEVOTHROID) 75 MCG tablet Take 75 mcg by mouth daily.     Yes Historical Provider, MD  LORazepam (ATIVAN) 1 MG tablet Take 1 mg by mouth daily as needed.     Yes Historical Provider, MD  Omega-3  Fatty Acids (EQL FISH OIL) 1000 MG CAPS Take 1 capsule by mouth daily.     Yes Historical Provider, MD  phenylephrine (NEO-SYNEPHRINE) 1 % nasal spray Place 3 drops into the nose once. 11/12/12  Yes Waymon Budge, MD  pimecrolimus (ELIDEL) 1 % cream Apply topically 2 (two) times daily. Pick one area to try for several days 05/28/12 05/28/13 Yes Clinton D Young, MD  amoxicillin (AMOXIL) 500 MG capsule Take 2 capsules (1,000 mg total) by mouth 2 (two) times daily. 12/15/12   Jonita Albee, MD  cetirizine (ZYRTEC) 10 MG tablet Take 10 mg by mouth daily.    Historical Provider, MD  fluticasone (FLONASE) 50 MCG/ACT nasal spray Place 2 sprays into the nose daily.    Historical Provider, MD    Allergies:  Allergies  Allergen Reactions  . Augmentin (Amoxicillin-Pot Clavulanate) Nausea Only    History   Social History  . Marital Status: Widowed    Spouse Name: N/A    Number of Children: N/A  . Years of Education: N/A   Occupational History  . Not on file.   Social History Main Topics  . Smoking status: Never Smoker   . Smokeless tobacco: Not on file  . Alcohol Use: No  . Drug Use: No  . Sexually Active: No   Other Topics Concern  . Not on  file   Social History Narrative  . No narrative on file     Review of Systems: Constitutional: negative for chills, fever, night sweats or weight changes Cardiovascular: negative for chest pain or palpitations Respiratory: negative for hemoptysis, wheezing, or shortness of breath Abdominal: negative for abdominal pain, nausea, vomiting or diarrhea Dermatological: negative for rash Neurologic: negative for headache   Physical Exam: Blood pressure 138/66, pulse 87, temperature 98.1 F (36.7 C), temperature source Oral, resp. rate 16, height 5\' 2"  (1.575 m), weight 153 lb (69.4 kg), SpO2 97.00%., Body mass index is 27.98 kg/(m^2). General: Well developed, well nourished, in no acute distress. Head: Normocephalic, atraumatic, eyes without  discharge, sclera non-icteric, nares are congested. Bilateral auditory canals clear, TM's are without perforation, pearly grey with reflective cone of light bilaterally. Serous effusion bilaterally behind TM's. Maxillary sinus TTP. Oral cavity moist, dentition normal. Posterior pharynx with post nasal drip and mild erythema. No peritonsillar abscess or tonsillar exudate. Neck: Supple. No thyromegaly. Full ROM. No lymphadenopathy. Lungs: Coarse breath sounds bilaterally without Clear bilaterally to auscultation without wheezes, rales, or rhonchi. Breathing is unlabored.  Heart: RRR with S1 S2. No murmurs, rubs, or gallops appreciated. Abdomen: Soft, non-tender, non-distended with normoactive bowel sounds. No hepatosplenomegaly. No rebound/guarding. No obvious abdominal masses. McBurney's, Rovsing's, Iliopsoas, and table jar all negative. Msk:  Strength and tone normal for age. Extremities: No clubbing or cyanosis. No edema. Neuro: Alert and oriented X 3. Moves all extremities spontaneously. CNII-XII grossly in tact. Psych:  Responds to questions appropriately with a normal affect.   Labs: Results for orders placed in visit on 12/15/12  POCT URINALYSIS DIPSTICK      Result Value Range   Color, UA       Clarity, UA       Glucose, UA neg     Bilirubin, UA neg     Ketones, UA nge     Spec Grav, UA 1.015     Blood, UA moderate     pH, UA >=9.0     Protein, UA >=300     Urobilinogen, UA 0.2     Nitrite, UA neg     Leukocytes, UA Trace        ASSESSMENT AND PLAN:  79 y.o. year old female with UTI symptoms Urinary frequency - Plan: POCT urinalysis dipstick, POCT UA - Microscopic Only, Urine culture, ciprofloxacin (CIPRO) 250 MG tablet, DISCONTINUED: ciprofloxacin (CIPRO) 250 MG tablet  Chronic low back pain - Plan: Urine culture  Recurrent UTI - Plan: ciprofloxacin (CIPRO) 250 MG tablet  Allergic rhinitis - Plan: mometasone (NASONEX) 50 MCG/ACT nasal  spray   - -Mucinex -Tylenol/Motrin prn -Rest/fluids -RTC precautions -RTC 3-5 days if no improvement  Signed, Elvina Sidle, MD 12/28/2012 2:19 PM

## 2012-12-31 LAB — URINE CULTURE: Colony Count: >=100000

## 2013-01-10 ENCOUNTER — Encounter: Payer: Self-pay | Admitting: Cardiovascular Disease

## 2013-01-12 ENCOUNTER — Ambulatory Visit (INDEPENDENT_AMBULATORY_CARE_PROVIDER_SITE_OTHER): Payer: Medicare Other | Admitting: Family Medicine

## 2013-01-12 VITALS — BP 167/74 | HR 71 | Temp 97.9°F | Resp 18 | Ht 62.5 in | Wt 148.0 lb

## 2013-01-12 DIAGNOSIS — N390 Urinary tract infection, site not specified: Secondary | ICD-10-CM

## 2013-01-12 LAB — POCT UA - MICROSCOPIC ONLY
Bacteria, U Microscopic: NEGATIVE
Casts, Ur, LPF, POC: NEGATIVE
Crystals, Ur, HPF, POC: NEGATIVE
Mucus, UA: NEGATIVE
Yeast, UA: NEGATIVE

## 2013-01-12 LAB — POCT URINALYSIS DIPSTICK
Blood, UA: NEGATIVE
Glucose, UA: NEGATIVE
Ketones, UA: 15
Leukocytes, UA: NEGATIVE
Nitrite, UA: NEGATIVE
Spec Grav, UA: 1.025
Urobilinogen, UA: 1
pH, UA: 5.5

## 2013-01-12 MED ORDER — KETOCONAZOLE 2 % EX CREA: TOPICAL_CREAM | Freq: Two times a day (BID) | CUTANEOUS | Status: DC

## 2013-01-12 MED ORDER — IPRATROPIUM BROMIDE 0.03 % NA SOLN: 2.0000 | Freq: Two times a day (BID) | NASAL | Status: DC

## 2013-01-12 MED ORDER — SULFAMETHOXAZOLE-TRIMETHOPRIM 800-160 MG PO TABS: 1.0000 | ORAL_TABLET | Freq: Two times a day (BID) | ORAL | Status: DC

## 2013-01-12 NOTE — Progress Notes (Signed)
79 yo woman with 9 days of mild dysuria and urgency following urine culture.  Objective:  NAD  Results for orders placed in visit on 01/12/13  POCT UA - MICROSCOPIC ONLY      Result Value Range   WBC, Ur, HPF, POC 0-3     RBC, urine, microscopic 0-1     Bacteria, U Microscopic NEG     Mucus, UA NEG     Epithelial cells, urine per micros 3-6     Crystals, Ur, HPF, POC NEG     Casts, Ur, LPF, POC NEG     Yeast, UA NEG    POCT URINALYSIS DIPSTICK      Result Value Range   Color, UA YELLOW     Clarity, UA CLOUDY     Glucose, UA NEG     Bilirubin, UA SMALL     Ketones, UA 15     Spec Grav, UA 1.025     Blood, UA NEG     pH, UA 5.5     Protein, UA TRACE     Urobilinogen, UA 1.0     Nitrite, UA NEG     Leukocytes, UA Negative     Assessment:  Urinary instability for 9 days.  Mild vulvar irritation.  Allergic rhinitis  UTI (urinary tract infection) - Plan: POCT UA - Microscopic Only, POCT urinalysis dipstick, sulfamethoxazole-trimethoprim (BACTRIM DS,SEPTRA DS) 800-160 MG per tablet

## 2013-01-30 ENCOUNTER — Other Ambulatory Visit: Payer: Self-pay | Admitting: Internal Medicine

## 2013-02-06 NOTE — Telephone Encounter (Signed)
Please advise if okay to refill. Thanks.  

## 2013-02-07 NOTE — Telephone Encounter (Signed)
Ok refill meclizine

## 2013-02-19 ENCOUNTER — Ambulatory Visit (INDEPENDENT_AMBULATORY_CARE_PROVIDER_SITE_OTHER): Payer: Medicare Other | Admitting: Family Medicine

## 2013-02-19 VITALS — BP 184/76 | HR 72 | Temp 98.4°F | Resp 17 | Ht 62.5 in | Wt 153.0 lb

## 2013-02-19 DIAGNOSIS — R350 Frequency of micturition: Secondary | ICD-10-CM

## 2013-02-19 DIAGNOSIS — R32 Unspecified urinary incontinence: Secondary | ICD-10-CM

## 2013-02-19 DIAGNOSIS — N390 Urinary tract infection, site not specified: Secondary | ICD-10-CM

## 2013-02-19 LAB — POCT URINALYSIS DIPSTICK
Bilirubin, UA: NEGATIVE
Glucose, UA: NEGATIVE
Nitrite, UA: NEGATIVE
Spec Grav, UA: 1.02
Urobilinogen, UA: 0.2

## 2013-02-19 LAB — POCT UA - MICROSCOPIC ONLY: Casts, Ur, LPF, POC: NEGATIVE

## 2013-02-19 MED ORDER — CEPHALEXIN 500 MG PO CAPS: 500.0000 mg | ORAL_CAPSULE | Freq: Four times a day (QID) | ORAL | Status: DC

## 2013-02-19 NOTE — Progress Notes (Signed)
     Results for orders placed in visit on 02/19/13  POCT UA - MICROSCOPIC ONLY      Result Value Range   WBC, Ur, HPF, POC 26-48     RBC, urine, microscopic 2-5     Bacteria, U Microscopic 1+     Mucus, UA trace     Epithelial cells, urine per micros 1-6     Crystals, Ur, HPF, POC neg     Casts, Ur, LPF, POC neg     Yeast, UA neg    POCT URINALYSIS DIPSTICK      Result Value Range   Color, UA yellow     Clarity, UA clear     Glucose, UA neg     Bilirubin, UA neg     Ketones, UA trace     Spec Grav, UA 1.020     Blood, UA neg     pH, UA 5.0     Protein, UA neg     Urobilinogen, UA 0.2     Nitrite, UA neg     Leukocytes, UA small (1+)

## 2013-02-19 NOTE — Patient Instructions (Signed)
Take cephalexin twice daily   Drink plenty of fluids  Follow up with urologist  Return if problems   According to the medical record you have had 6 UTI's in 1 year,  5 of those in last 5 months. Two cultures were e. Coli and klebsiella.

## 2013-04-22 ENCOUNTER — Other Ambulatory Visit: Payer: Self-pay | Admitting: Physician Assistant

## 2013-07-04 ENCOUNTER — Telehealth: Payer: Self-pay | Admitting: Internal Medicine

## 2013-07-04 NOTE — Telephone Encounter (Signed)
ATC pt line busy x 3 wcb 

## 2013-07-05 ENCOUNTER — Ambulatory Visit (INDEPENDENT_AMBULATORY_CARE_PROVIDER_SITE_OTHER): Payer: Medicare Other | Admitting: Emergency Medicine

## 2013-07-05 VITALS — BP 130/72 | HR 88 | Temp 98.2°F | Resp 17 | Ht 62.5 in | Wt 151.0 lb

## 2013-07-05 DIAGNOSIS — R350 Frequency of micturition: Secondary | ICD-10-CM

## 2013-07-05 DIAGNOSIS — R300 Dysuria: Secondary | ICD-10-CM

## 2013-07-05 DIAGNOSIS — R3915 Urgency of urination: Secondary | ICD-10-CM

## 2013-07-05 DIAGNOSIS — N390 Urinary tract infection, site not specified: Secondary | ICD-10-CM

## 2013-07-05 LAB — POCT URINALYSIS DIPSTICK
Glucose, UA: NEGATIVE
Ketones, UA: 15
Protein, UA: 100
Urobilinogen, UA: 0.2

## 2013-07-05 MED ORDER — CIPROFLOXACIN HCL 250 MG PO TABS: 250.0000 mg | ORAL_TABLET | Freq: Two times a day (BID) | ORAL | Status: DC

## 2013-07-05 NOTE — Telephone Encounter (Signed)
CY we are booked as you are not here 2 days next week; would you rather call Rx in for patient or see if they are willing to see TP?

## 2013-07-05 NOTE — Progress Notes (Signed)
  Subjective:    Patient ID: [name], female    DOB: 1922/02/10, 79 y.o.   MRN: [id number]  HPI Pt here c/o of frequency, and lack of control of urine,and anuria x 1 week. Feels like a uti with dysuria pt has a history of uti.       Review of Systems     Objective:   Physical Exam patient is not ill-appearing. She is nontoxic. There is no CVA pain. There is no suprapubic pain. Urine obtained by catheterization is very cloudy to  Results for orders placed in visit on 07/05/13  POCT URINALYSIS DIPSTICK      Result Value Range   Color, UA yellow     Clarity, UA cloudy     Glucose, UA neg     Bilirubin, UA small     Ketones, UA 15 mg/dL     Spec Grav, UA >=1.478     Blood, UA moderate     pH, UA 5.5     Protein, UA 100 mg dL     Urobilinogen, UA 0.2     Nitrite, UA neg     Leukocytes, UA small (1+)          Assessment & Plan:  Culture was sent. We'll treat with Cipro 250 twice a day #10

## 2013-07-05 NOTE — Patient Instructions (Addendum)
Urinary Tract Infection  Urinary tract infections (UTIs) can develop anywhere along your urinary tract. Your urinary tract is your body's drainage system for removing wastes and extra water. Your urinary tract includes two kidneys, two ureters, a bladder, and a urethra. Your kidneys are a pair of bean-shaped organs. Each kidney is about the size of your fist. They are located below your ribs, one on each side of your spine.  CAUSES  Infections are caused by microbes, which are microscopic organisms, including fungi, viruses, and bacteria. These organisms are so small that they can only be seen through a microscope. Bacteria are the microbes that most commonly cause UTIs.  SYMPTOMS   Symptoms of UTIs may vary by age and gender of the patient and by the location of the infection. Symptoms in young women typically include a frequent and intense urge to urinate and a painful, burning feeling in the bladder or urethra during urination. Older women and men are more likely to be tired, shaky, and weak and have muscle aches and abdominal pain. A fever may mean the infection is in your kidneys. Other symptoms of a kidney infection include pain in your back or sides below the ribs, nausea, and vomiting.  DIAGNOSIS  To diagnose a UTI, your caregiver will ask you about your symptoms. Your caregiver also will ask to provide a urine sample. The urine sample will be tested for bacteria and white blood cells. White blood cells are made by your body to help fight infection.  TREATMENT   Typically, UTIs can be treated with medication. Because most UTIs are caused by a bacterial infection, they usually can be treated with the use of antibiotics. The choice of antibiotic and length of treatment depend on your symptoms and the type of bacteria causing your infection.  HOME CARE INSTRUCTIONS   If you were prescribed antibiotics, take them exactly as your caregiver instructs you. Finish the medication even if you feel better after you  have only taken some of the medication.   Drink enough water and fluids to keep your urine clear or pale yellow.   Avoid caffeine, tea, and carbonated beverages. They tend to irritate your bladder.   Empty your bladder often. Avoid holding urine for long periods of time.   Empty your bladder before and after sexual intercourse.   After a bowel movement, women should cleanse from front to back. Use each tissue only once.  SEEK MEDICAL CARE IF:    You have back pain.   You develop a fever.   Your symptoms do not begin to resolve within 3 days.  SEEK IMMEDIATE MEDICAL CARE IF:    You have severe back pain or lower abdominal pain.   You develop chills.   You have nausea or vomiting.   You have continued burning or discomfort with urination.  MAKE SURE YOU:    Understand these instructions.   Will watch your condition.   Will get help right away if you are not doing well or get worse.  Document Released: 07/13/2005 Document Revised: 04/03/2012 Document Reviewed: 11/11/2011  [employer] Patient Information 2014 [employer].

## 2013-07-05 NOTE — Telephone Encounter (Signed)
Per CDY:  Rec pt try mucinex-d and nasal saline rinses (neti pot) until her scheduled appt.    -----  Called, spoke with pt.  Informed her of CDY's recs.  She verbalized understanding and is to call back if symptoms do not improve or worsen.

## 2013-07-05 NOTE — Telephone Encounter (Signed)
Patient reports she has been having increased sinus symptoms and sinus headache Patient is scheduled to see Dr. Maple Hudson 10/9 @ 215pm However patient would like to be seen next week in the afternoon as sx are worsening Nothing available at this time Please advise if patient can be worked in, thank you!  Last OV 11/12/12

## 2013-07-07 LAB — URINE CULTURE

## 2013-07-25 ENCOUNTER — Ambulatory Visit (INDEPENDENT_AMBULATORY_CARE_PROVIDER_SITE_OTHER): Payer: Medicare Other | Admitting: Internal Medicine

## 2013-07-25 ENCOUNTER — Encounter: Payer: Self-pay | Admitting: Internal Medicine

## 2013-07-25 VITALS — BP 114/78 | HR 83 | Ht 64.0 in | Wt 154.0 lb

## 2013-07-25 DIAGNOSIS — J310 Chronic rhinitis: Secondary | ICD-10-CM

## 2013-07-25 DIAGNOSIS — H6983 Other specified disorders of Eustachian tube, bilateral: Secondary | ICD-10-CM

## 2013-07-25 DIAGNOSIS — Z23 Encounter for immunization: Secondary | ICD-10-CM

## 2013-07-25 DIAGNOSIS — B370 Candidal stomatitis: Secondary | ICD-10-CM

## 2013-07-25 DIAGNOSIS — H6980 Other specified disorders of Eustachian tube, unspecified ear: Secondary | ICD-10-CM

## 2013-07-25 MED ORDER — FLUCONAZOLE 150 MG PO TABS: ORAL_TABLET | ORAL | Status: DC

## 2013-07-25 MED ORDER — AZELASTINE-FLUTICASONE 137-50 MCG/ACT NA SUSP: 2.0000 | Freq: Every day | NASAL | Status: DC

## 2013-07-25 NOTE — Patient Instructions (Signed)
Hi dose flu vax  Script sent for Diflucan tabs to clear yeast from tongue  Sample Dymista nasal spray      Try 1-2 puffs each nostril once daily at bedtime. Try using this instead of the flunisolide nose spray.  You can still use the ipratropium if needed.

## 2013-07-25 NOTE — Progress Notes (Signed)
Subjective:    Patient ID: [name], female    DOB: 1922/04/16, 79 y.o.   MRN: [id number]  HPI 05/24/11- 70 yoF never smoker followed for allergic rhinitis,  C/o generalized itching with no rash- for months to a year. Saw PCP and dermatologist. They gave "oily meds", topically , and she has been taking a daily "allergy pill" No new meds.  Has loop recorder implanted.  Nasal symptoms have been adequate controlled.  05/28/12- 89 yoF never smoker followed for allergic rhinitis, Still having itchy feelings all over; never has rash or hives that come up. Pruritus times one year without visible rash. Discussed possible relation to her pain medications. Allergy profile 05/24/2011-negative with total IgE 4.7. No relief from cetirizine or fexofenadine. Liver functions okay. She reports dysuria and asks if we could check a urinalysis while she is here for possible UTI.  11/12/12- 89 yoF never smoker followed for allergic rhinitis, Follows For: Runny nose - Dizziness - Ears feel full - Sneezing in am's - Still itching all over - Pt thinks she may have UTI - would like ua checked This morning head feels "unsteady", congested nose and ears, increased postnasal drip. Still itching all over her scalp and chest. No rash. While here she asks if we could check for possible UTI-some burning.  07/25/13- 89 yoF never smoker followed for allergic rhinitis, FOLLOWS FOR: continues to have runny nose year round. "all summer, if not right" and watery nose, morning sneeze, ears feel full. Ipratropium and Flonase nasal sprays do help.  Review of Systems- see HPI Constitutional:   No-   weight loss, night sweats, fevers, chills, fatigue, lassitude. HEENT:   No-  headaches, difficulty swallowing, tooth/dental problems, sore throat,       No-  Sneezing,+ itching,+ear ache, +nasal congestion, +post nasal drip,  CV:  No-   chest pain, orthopnea, PND, swelling in lower extremities, anasarca, dizziness,  palpitations Resp: No-   shortness of breath with exertion or at rest.              No-   productive cough,  No non-productive cough,  No-  coughing up of blood.              No-   change in color of mucus.  No- wheezing.   Skin: No-   rash or lesions.  + Pruritus GI:  No-   heartburn, indigestion, abdominal pain, nausea, vomiting,  GU: .  MS:  No-   joint pain or swelling.   Neuro- nothing unusual  Psych:  No- change in mood or affect. No depression or anxiety.  No memory loss. Objective:   Physical Exam General- Alert, Oriented, Affect-appropriate, Distress- none acute. Looks normal and comfortable for age. Skin- rash-none, lesions- none, excoriation- none Lymphadenopathy- none Head- atraumatic            Eyes- Gross vision intact, PERRLA, conjunctivae clear secretions            Ears- Hearing, canals normal            Nose- Clear, +Septal dev, mucus, polyps, erosion, perforation             Throat- Mallampati II , +tongue coated , drainage- none, tonsils- atrophic Neck- flexible , trachea midline, no stridor , thyroid nl, carotid no bruit Chest - symmetrical excursion , unlabored           Heart/CV- RRR , 2/6 systolic murmur , no gallop  , no rub, nl  s1 s2                           - JVD- none , edema- none, stasis changes- none, varices- none           Lung- +crackles left base, wheeze- none, cough- none , dullness-none, rub- none           Chest wall-  Abd-  Br/ Gen/ Rectal- Not done, not indicated Extrem- cyanosis- none, clubbing, none, atrophy- none, strength- nl Neuro- grossly intact to observation Assessment & Plan:

## 2013-08-10 DIAGNOSIS — B370 Candidal stomatitis: Secondary | ICD-10-CM | POA: Insufficient documentation

## 2013-08-10 NOTE — Assessment & Plan Note (Signed)
Plan-Diflucan, mouth care

## 2013-08-10 NOTE — Assessment & Plan Note (Signed)
Plan-occasional morning dose of decongestant if needed

## 2013-08-10 NOTE — Assessment & Plan Note (Signed)
Nonallergic rhinitis aggravated by her septal deviation. Probable vasomotor rhinitis. Plan-discussed role of ipratropium and antihistamines

## 2013-08-21 ENCOUNTER — Telehealth: Payer: Self-pay | Admitting: Internal Medicine

## 2013-08-21 MED ORDER — AZELASTINE-FLUTICASONE 137-50 MCG/ACT NA SUSP: 1.0000 | Freq: Every day | NASAL | Status: DC

## 2013-08-21 NOTE — Telephone Encounter (Signed)
Pt was seen on 07/25/13 by Dr. Maple Hudson:  Patient Instructions    Hi dose flu vax  Script sent for Diflucan tabs to clear yeast from tongue  Sample Dymista nasal spray Try 1-2 puffs each nostril once daily at bedtime. Try using this instead of the flunisolide nose spray.  You can still use the ipratropium if needed.   -----  Called, spoke with pt.  Verified she would like dymista rx sent to [facility].  Rx sent.  Pt aware and voiced no further questions or concerns at this time.

## 2013-08-22 ENCOUNTER — Telehealth: Payer: Self-pay | Admitting: Internal Medicine

## 2013-08-22 NOTE — Telephone Encounter (Signed)
I spoke with the pt and she states the dymista is too expensive and she does not want it. I advised her of the option of sending the 2 meds separately. She took down the names and will check with her pharmacy on cost for these and will let us know. I have called [facility] and cancelled rx for dymista.

## 2013-09-19 ENCOUNTER — Other Ambulatory Visit: Payer: Self-pay | Admitting: Physician Assistant

## 2013-10-17 DIAGNOSIS — F419 Anxiety disorder, unspecified: Secondary | ICD-10-CM

## 2013-10-17 DIAGNOSIS — M545 Low back pain, unspecified: Secondary | ICD-10-CM

## 2013-10-17 DIAGNOSIS — I639 Cerebral infarction, unspecified: Secondary | ICD-10-CM

## 2013-10-17 DIAGNOSIS — M47812 Spondylosis without myelopathy or radiculopathy, cervical region: Secondary | ICD-10-CM

## 2013-10-17 DIAGNOSIS — T84029A Dislocation of unspecified internal joint prosthesis, initial encounter: Secondary | ICD-10-CM

## 2013-10-17 DIAGNOSIS — Z96649 Presence of unspecified artificial hip joint: Secondary | ICD-10-CM

## 2013-10-17 DIAGNOSIS — E039 Hypothyroidism, unspecified: Secondary | ICD-10-CM

## 2013-10-17 DIAGNOSIS — G319 Degenerative disease of nervous system, unspecified: Secondary | ICD-10-CM

## 2013-10-17 DIAGNOSIS — E871 Hypo-osmolality and hyponatremia: Secondary | ICD-10-CM

## 2013-10-17 DIAGNOSIS — I679 Cerebrovascular disease, unspecified: Secondary | ICD-10-CM

## 2013-10-17 HISTORY — DX: Presence of unspecified artificial hip joint: Z96.649

## 2013-10-17 HISTORY — DX: Low back pain, unspecified: M54.50

## 2013-10-17 HISTORY — DX: Degenerative disease of nervous system, unspecified: G31.9

## 2013-10-17 HISTORY — DX: Dislocation of unspecified internal joint prosthesis, initial encounter: T84.029A

## 2013-10-17 HISTORY — DX: Cerebrovascular disease, unspecified: I67.9

## 2013-10-17 HISTORY — DX: Anxiety disorder, unspecified: F41.9

## 2013-10-17 HISTORY — DX: Hypothyroidism, unspecified: E03.9

## 2013-10-17 HISTORY — DX: Cerebral infarction, unspecified: I63.9

## 2013-10-17 HISTORY — DX: Spondylosis without myelopathy or radiculopathy, cervical region: M47.812

## 2013-10-17 HISTORY — DX: Hypo-osmolality and hyponatremia: E87.1

## 2013-11-12 ENCOUNTER — Ambulatory Visit (INDEPENDENT_AMBULATORY_CARE_PROVIDER_SITE_OTHER): Payer: Medicare Other | Admitting: Internal Medicine

## 2013-11-12 VITALS — BP 128/78 | HR 63 | Temp 97.6°F | Resp 16 | Ht 62.0 in | Wt 150.0 lb

## 2013-11-12 DIAGNOSIS — N390 Urinary tract infection, site not specified: Secondary | ICD-10-CM

## 2013-11-12 DIAGNOSIS — R350 Frequency of micturition: Secondary | ICD-10-CM

## 2013-11-12 LAB — POCT URINALYSIS DIPSTICK
BILIRUBIN UA: NEGATIVE
GLUCOSE UA: NEGATIVE
Nitrite, UA: NEGATIVE
Protein, UA: 100
Spec Grav, UA: 1.025
Urobilinogen, UA: 0.2
pH, UA: 5.5

## 2013-11-12 LAB — POCT UA - MICROSCOPIC ONLY
Casts, Ur, LPF, POC: NEGATIVE
Crystals, Ur, HPF, POC: NEGATIVE
YEAST UA: POSITIVE

## 2013-11-12 MED ORDER — NITROFURANTOIN MONOHYD MACRO 100 MG PO CAPS: 100.0000 mg | ORAL_CAPSULE | Freq: Two times a day (BID) | ORAL | Status: DC

## 2013-11-12 NOTE — Progress Notes (Signed)
Subjective:    Patient ID: [name], female    DOB: 1922/01/21, 80 y.o.   MRN: [id number]  HPI This chart was scribed for [facility] by Smiley Houseman, Scribe. This patient was seen in room 3 and the patient's care was started at 1:36 PM.  HPI Comments: [name] is a 80 y.o. female who presents to the [facility] complaining of intermittent worsening urinary frequency that started yesterday.  She also complains of dysuria and bladder incontinence.  She states these symptoms are normal for her when she has a urinary tract infection.  She reports she hasn't had a UTI recently.  Pt states that she has taken AZOs for symptom relief, but is unsure if they relieved her of pain.  Pt also complains of her right hand PIP joints swelling with small blisters present.  She has normal ROM, but is concerned about the blisters.      Past Surgical History  Procedure Laterality Date  . Tonsillectomy    . Total hip arthroplasty      bilateral  . Pelvic sling    . Joint replacement    . Loop recorder implant  07/2009    to eval syncope  . Loop recorder explant  01/12/2012    Family History  Problem Relation Age of Onset  . Cancer Father     History   Social History  . Marital Status: Widowed    Spouse Name: N/A    Number of Children: N/A  . Years of Education: N/A   Occupational History  . Not on file.   Social History Main Topics  . Smoking status: Never Smoker   . Smokeless tobacco: Not on file  . Alcohol Use: No  . Drug Use: No  . Sexual Activity: No   Other Topics Concern  . Not on file   Social History Narrative  . No narrative on file    Allergies  Allergen Reactions  . Augmentin [Amoxicillin-Pot Clavulanate] Nausea Only    Patient Active Problem List   Diagnosis Date Noted  . Thrush 08/10/2013  . Chronic low back pain 12/28/2012  . Dyslipidemia   . Hx of syncope   . EUSTACHIAN TUBE DYSFUNCTION 08/11/2007  .  Nonallergic rhinitis 08/11/2007    Review of Systems  Constitutional: Negative for fever and chills.  HENT: Negative for congestion and rhinorrhea.   Respiratory: Negative for cough and shortness of breath.   Cardiovascular: Negative for chest pain.  Gastrointestinal: Negative for nausea, vomiting, abdominal pain and diarrhea.  Genitourinary: Positive for dysuria and frequency. Negative for difficulty urinating.       Bladder incontinence  Musculoskeletal: Negative for back pain.  Skin: Negative for color change and rash.  Neurological: Negative for syncope.       Objective:   Physical Exam  Nursing note and vitals reviewed. Constitutional: She is oriented to person, place, and time. She appears well-developed and well-nourished. No distress.  HENT:  Head: Normocephalic and atraumatic.  Eyes: EOM are normal.  Neck: Neck supple. No tracheal deviation present.  Cardiovascular: Normal rate.   Pulmonary/Chest: Effort normal. No respiratory distress.  Musculoskeletal: Normal range of motion.  Neurological: She is alert and oriented to person, place, and time.  Skin: Skin is warm and dry.  Psychiatric: She has a normal mood and affect. Her behavior is normal.     Triage Vitals: BP 128/78  Pulse 63  Temp(Src) 97.6 F (36.4 C) (Oral)  Resp  16  Ht 5\' 2"  (1.575 m)  Wt 150 lb (68.04 kg)  BMI 27.43 kg/m2  SpO2 97%      Assessment & Plan:   I have completed the patient encounter in its entirety as documented by the scribe, with editing by me where necessary. Airelle Everding P. Merla Richesoolittle, M.D. Urinary tract infection, site not specified - Plan: nitrofurantoin, macrocrystal-monohydrate, (MACROBID) 100 MG capsule, Urine culture  Urine frequency - Plan: POCT UA - Microscopic Only, POCT urinalysis dipstick  Meds ordered this encounter  Medications  . nitrofurantoin, macrocrystal-monohydrate, (MACROBID) 100 MG capsule    Sig: Take 1 capsule (100 mg total) by mouth 2 (two) times daily.     Dispense:  14 capsule    Refill:  0    Call w/ cult

## 2013-11-14 ENCOUNTER — Telehealth: Payer: Self-pay

## 2013-11-14 NOTE — Telephone Encounter (Signed)
Patient states that she saw Dr. Merla Richesoolittle for a UTI on 11/12/2013. The medication given to her (Macrobid) is not working well and she has been very uncomfortable. Patient would like something else if possible. Patient would also like the medicine called into [facility] because they have a delivery service that will bring it to her home.   [phone]

## 2013-11-14 NOTE — Telephone Encounter (Signed)
Spoke with pt and advised her that we should wait and get results back to make decision on what medication to prescribe. She states she has used Cipro before. She was understanding and will await.

## 2013-11-14 NOTE — Telephone Encounter (Signed)
LMOM for pt to call back. Urine Cx not resulted yet.

## 2013-11-15 ENCOUNTER — Other Ambulatory Visit: Payer: Self-pay | Admitting: *Deleted

## 2013-11-15 LAB — URINE CULTURE: Colony Count: >=100000

## 2013-11-15 MED ORDER — CIPROFLOXACIN HCL 500 MG PO TABS: 500.0000 mg | ORAL_TABLET | Freq: Two times a day (BID) | ORAL | Status: DC

## 2013-11-15 NOTE — Telephone Encounter (Signed)
Had to resend Cipro to the correct pharmacy for pt delivery.

## 2013-11-15 NOTE — Telephone Encounter (Signed)
Pt advised.

## 2013-11-15 NOTE — Telephone Encounter (Signed)
Changed to Cipro and sent to pharmacy

## 2014-01-23 ENCOUNTER — Ambulatory Visit (INDEPENDENT_AMBULATORY_CARE_PROVIDER_SITE_OTHER): Payer: Medicare Other | Admitting: Internal Medicine

## 2014-01-23 ENCOUNTER — Encounter: Payer: Self-pay | Admitting: Internal Medicine

## 2014-01-23 VITALS — BP 116/72 | HR 88 | Ht 64.0 in | Wt 152.6 lb

## 2014-01-23 DIAGNOSIS — J329 Chronic sinusitis, unspecified: Secondary | ICD-10-CM

## 2014-01-23 DIAGNOSIS — J310 Chronic rhinitis: Secondary | ICD-10-CM

## 2014-01-23 NOTE — Progress Notes (Signed)
Subjective:    Patient ID: [name], female    DOB: [birth year], 80 y.o.   MRN: [id number]  HPI 05/24/11- 86 yoF never smoker followed for allergic rhinitis,  C/o generalized itching with no rash- for months to a year. Saw PCP and dermatologist. They gave "oily meds", topically , and she has been taking a daily "allergy pill" No new meds.  Has loop recorder implanted.  Nasal symptoms have been adequate controlled.  05/28/12- 89 yoF never smoker followed for allergic rhinitis, Still having itchy feelings all over; never has rash or hives that come up. Pruritus times one year without visible rash. Discussed possible relation to her pain medications. Allergy profile 05/24/2011-negative with total IgE 4.7. No relief from cetirizine or fexofenadine. Liver functions okay. She reports dysuria and asks if we could check a urinalysis while she is here for possible UTI.  11/12/12- 89 yoF never smoker followed for allergic rhinitis, Follows For: Runny nose - Dizziness - Ears feel full - Sneezing in am's - Still itching all over - Pt thinks she may have UTI - would like ua checked This morning head feels "unsteady", congested nose and ears, increased postnasal drip. Still itching all over her scalp and chest. No rash. While here she asks if we could check for possible UTI-some burning.  07/25/13- 89 yoF never smoker followed for allergic rhinitis, FOLLOWS FOR: continues to have runny nose year round. "all summer, if not right" and watery nose, morning sneeze, ears feel full. Ipratropium and Flonase nasal sprays do help.  01/23/14- 89 yoF never smoker followed for allergic/ nonallergic  rhinitis, FOLLOWS FOR: runny nose, sneezing all the time; wonders if she is allergic to hydrocodone for pain-as she notices that she has alot of itching on her skin and head-questions if her life alert (has nickel)could be an issues as well. Perennial watery rhinitis. Ipratropium helps at mealtime. Did not like  Dymista. Feels pressure or fullness frontal and maxillary areas. Perennial generalized itching, possibly from hydrocodone she takes for low back pain.  Review of Systems- see HPI Constitutional:   No-   weight loss, night sweats, fevers, chills, fatigue, lassitude. HEENT:   No-  headaches, difficulty swallowing, tooth/dental problems, sore throat,       No-  Sneezing,+ itching,+ear ache, +nasal congestion, +post nasal drip,  CV:  No-   chest pain, orthopnea, PND, swelling in lower extremities, anasarca, dizziness, palpitations Resp: No-   shortness of breath with exertion or at rest.              No-   productive cough,  No non-productive cough,  No-  coughing up of blood.              No-   change in color of mucus.  No- wheezing.   Skin: No-   rash or lesions.  + Pruritus GI:  No-   heartburn, indigestion, abdominal pain, nausea, vomiting,  GU: .  MS:  No-   joint pain or swelling.   Neuro- nothing unusual  Psych:  No- change in mood or affect. No depression or anxiety.  No memory loss. Objective:   Physical Exam General- Alert, Oriented, Affect-appropriate, Distress- none acute. Looks normal and comfortable for age. Skin- rash-none, lesions- none, excoriation- none Lymphadenopathy- none Head- atraumatic            Eyes- Gross vision intact, PERRLA, conjunctivae clear secretions            Ears- Hearing, canals normal  Nose- Clear, +Septal dev, mucus, polyps, erosion, perforation             Throat- Mallampati II , +tongue coated , drainage- none, tonsils- atrophic Neck- flexible , trachea midline, no stridor , thyroid nl, carotid no bruit Chest - symmetrical excursion , unlabored           Heart/CV- RRR , 2/6 systolic murmur , no gallop  , no rub, nl s1 s2                           - JVD- none , edema- none, stasis changes- none, varices- none           Lung- clear, wheeze- none, cough- none , dullness-none, rub- none           Chest wall-  Abd-  Br/ Gen/ Rectal- Not  done, not indicated Extrem- cyanosis- none, clubbing, none, atrophy- none, strength- nl, +walker Neuro- grossly intact to observation Assessment & Plan:

## 2014-01-23 NOTE — Patient Instructions (Signed)
Order- CT max fac no contrast,    Dx chronic sinusitis  For the nasal congestion/ pressure- try otc Sudafed-PE decongestant  For the itching and watery nose- try otc antihistamine Chlor [place]  Also for you nose- try otc nasal spray Nasalcrom/ cromol

## 2014-01-27 ENCOUNTER — Ambulatory Visit
Admission: RE | Admit: 2014-01-27 | Discharge: 2014-01-27 | Disposition: A | Discharge status: Home / Self Care | Payer: Medicare Other | Source: Ambulatory Visit | Attending: Internal Medicine | Admitting: Internal Medicine

## 2014-01-27 DIAGNOSIS — J329 Chronic sinusitis, unspecified: Secondary | ICD-10-CM

## 2014-01-28 ENCOUNTER — Telehealth: Payer: Self-pay | Admitting: Internal Medicine

## 2014-01-28 MED ORDER — IPRATROPIUM BROMIDE 0.03 % NA SOLN: 2.0000 | Freq: Two times a day (BID) | NASAL | Status: DC

## 2014-01-28 NOTE — Telephone Encounter (Signed)
Rx has been sent in. Pt is aware. 

## 2014-01-28 NOTE — Telephone Encounter (Signed)
Ok to refill her atrovent/ ipratropium nasal spray prn

## 2014-01-28 NOTE — Telephone Encounter (Signed)
Pt just seen 01/23/14 and is requesting a r/f on her atrovent nasal spray.  CY please advise if this is ok to r/f thanks

## 2014-02-22 NOTE — Assessment & Plan Note (Signed)
Ipratropium helps with rhinorrhea Plan- Try Nasalcrom spray, Sudafed-PE in AMs. Schedule limited CT of sinuses. Try Chlor-[place].

## 2014-03-12 ENCOUNTER — Other Ambulatory Visit: Payer: Self-pay | Admitting: Internal Medicine

## 2014-03-20 ENCOUNTER — Other Ambulatory Visit: Payer: Self-pay | Admitting: Internal Medicine

## 2014-03-20 NOTE — Telephone Encounter (Signed)
CY, Please advise if okay to refill medication. Thanks.

## 2014-03-20 NOTE — Telephone Encounter (Signed)
Ok to refill 

## 2014-03-27 ENCOUNTER — Ambulatory Visit: Payer: Medicare Other | Admitting: Internal Medicine

## 2014-05-06 ENCOUNTER — Encounter (HOSPITAL_COMMUNITY): Payer: Self-pay | Admitting: Emergency Medicine

## 2014-05-06 ENCOUNTER — Emergency Department (HOSPITAL_COMMUNITY)
Admission: EM | Admit: 2014-05-06 | Discharge: 2014-05-06 | Disposition: A | Discharge status: Home / Self Care | Payer: Medicare Other | Source: Home / Self Care | Attending: Emergency Medicine | Admitting: Emergency Medicine

## 2014-05-06 ENCOUNTER — Emergency Department (HOSPITAL_COMMUNITY): Payer: Medicare Other

## 2014-05-06 DIAGNOSIS — R011 Cardiac murmur, unspecified: Secondary | ICD-10-CM

## 2014-05-06 DIAGNOSIS — S73005A Unspecified dislocation of left hip, initial encounter: Secondary | ICD-10-CM

## 2014-05-06 DIAGNOSIS — I6350 Cerebral infarction due to unspecified occlusion or stenosis of unspecified cerebral artery: Secondary | ICD-10-CM | POA: Diagnosis not present

## 2014-05-06 DIAGNOSIS — I6340 Cerebral infarction due to embolism of unspecified cerebral artery: Secondary | ICD-10-CM | POA: Diagnosis not present

## 2014-05-06 LAB — I-STAT CHEM 8, ED
BUN: 14 mg/dL (ref 6–23)
CALCIUM ION: 1.17 mmol/L (ref 1.13–1.30)
CHLORIDE: 100 meq/L (ref 96–112)
CREATININE: 0.8 mg/dL (ref 0.50–1.10)
GLUCOSE: 104 mg/dL — AB (ref 70–99)
HCT: 41 % (ref 36.0–46.0)
Hemoglobin: 13.9 g/dL (ref 12.0–15.0)
Potassium: 4 mEq/L (ref 3.7–5.3)
Sodium: 137 mEq/L (ref 137–147)
TCO2: 21 mmol/L (ref 0–100)

## 2014-05-06 LAB — CBC
HEMATOCRIT: 39 % (ref 36.0–46.0)
HEMOGLOBIN: 13.3 g/dL (ref 12.0–15.0)
MCH: 29.7 pg (ref 26.0–34.0)
MCHC: 34.1 g/dL (ref 30.0–36.0)
MCV: 87.1 fL (ref 78.0–100.0)
Platelets: 224 10*3/uL (ref 150–400)
RBC: 4.48 MIL/uL (ref 3.87–5.11)
RDW: 12.7 % (ref 11.5–15.5)
WBC: 5.5 10*3/uL (ref 4.0–10.5)

## 2014-05-06 MED ORDER — MIDAZOLAM HCL 2 MG/2ML IJ SOLN
2.0000 mg | Freq: Once | INTRAMUSCULAR | Status: AC
  Administered 2014-05-06: 2 mg via INTRAVENOUS

## 2014-05-06 MED ORDER — MIDAZOLAM HCL 2 MG/2ML IJ SOLN
INTRAMUSCULAR | Status: AC | PRN
  Administered 2014-05-06: 2 mg via INTRAVENOUS

## 2014-05-06 MED ORDER — PROPOFOL 10 MG/ML IV BOLUS
100.0000 mg | Freq: Once | INTRAVENOUS | Status: AC
  Administered 2014-05-06: 50 mg via INTRAVENOUS
  Filled 2014-05-06: qty 20

## 2014-05-06 MED ORDER — PROPOFOL 10 MG/ML IV BOLUS
INTRAVENOUS | Status: AC | PRN
  Administered 2014-05-06: 20 mg via INTRAVENOUS

## 2014-05-06 MED ORDER — TRAMADOL HCL 50 MG PO TABS: 50.0000 mg | ORAL_TABLET | Freq: Four times a day (QID) | ORAL | Status: DC | PRN

## 2014-05-06 MED ORDER — MIDAZOLAM HCL 2 MG/2ML IJ SOLN
INTRAMUSCULAR | Status: DC
Start: 2014-05-06 — End: 2014-05-06
  Filled 2014-05-06: qty 2

## 2014-05-06 MED ORDER — FENTANYL CITRATE 0.05 MG/ML IJ SOLN
100.0000 ug | Freq: Once | INTRAMUSCULAR | Status: AC
  Administered 2014-05-06: 100 ug via INTRAVENOUS
  Filled 2014-05-06: qty 2

## 2014-05-06 NOTE — ED Notes (Signed)
MD at bedside. 

## 2014-05-06 NOTE — ED Notes (Signed)
Per EMS pt was in bed and rolled the wrong way; Pt states hip just popped out; pt has hx of hip popping out x 2 prior; Pt states left hip is popped out of joint; pt c/o pain 8/10. Pt received 100 mcg Fent. Prior to arrival. Md assessing pt on arrival.

## 2014-05-06 NOTE — ED Notes (Signed)
Patient transported to X-ray 

## 2014-05-06 NOTE — Sedation Documentation (Signed)
Pt given more meds per Miller,Md due to pt still responding in pain when hip moved

## 2014-05-06 NOTE — ED Provider Notes (Signed)
CSN: [id number]     Arrival date & time 05/06/14  0004 History   First MD Initiated Contact with Patient 05/06/14 0008     Chief Complaint  Patient presents with  . Hip Injury     (Consider location/radiation/quality/duration/timing/severity/associated sxs/prior Treatment) HPI Comments: Pt is a pleasant 80 y/o female with hx of bilateral hip replacement (Dr. Lequita Halt) - was moving in bed and when she rolled to her side, she felt acute onset of pain and deformity of the L hip - pain is severe, constant and worse with moving - no associated numb / weak.  EMS gave 100mg  of fentanyl with some improvement.  She has otherwise been well without complaints including no fevers chills nausea vomiting shortness of breath chest pain back pain for lower extra knee swelling. She does not have any history of cardiac or pulmonary or neurologic abnormalities or diseases and denies diabetes or high blood pressure. She has had dislocations of her head multiple times in the past though she is unsure which.  The history is provided by the patient, medical records and the EMS personnel.    Past Medical History  Diagnosis Date  . Deviated nasal septum   . Dysfunction of eustachian tube   . Unspecified sinusitis (chronic)   . Allergic rhinitis, cause unspecified   . Arthritis   . Cataract   . Osteoporosis   . Hx of syncope     had loop recorder for 2 years now explanted, only PACs noted on device for over 2 years, no a fib, Echo 08/18/09 EF >55%mild concentric LVHnormal myoview, normal carotid dopplers 2010  . Pruritus of skin     poss. related to Losartan  . Dyslipidemia   . Normal cardiac stress test     lexiscan myoview 04/08/08   Past Surgical History  Procedure Laterality Date  . Tonsillectomy    . Total hip arthroplasty      bilateral  . Pelvic sling    . Joint replacement    . Loop recorder implant  07/2009    to eval syncope  . Loop recorder explant  01/12/2012   Family History  Problem  Relation Age of Onset  . Cancer Father    History  Substance Use Topics  . Smoking status: Never Smoker   . Smokeless tobacco: Not on file  . Alcohol Use: No   OB History   Grav Para Term Preterm Abortions TAB SAB Ect Mult Living                 Review of Systems  All other systems reviewed and are negative.     Allergies  Augmentin  Home Medications   Prior to Admission medications   Medication Sig Start Date End Date Taking? Authorizing Provider  aspirin 81 MG chewable tablet Chew 81 mg by mouth daily.   Yes Historical Provider, MD  Cholecalciferol (VITAMIN D) 2000 UNITS tablet Take 2,000 Units by mouth daily.   Yes Historical Provider, MD  HYDROcodone-acetaminophen (NORCO/VICODIN) 5-325 MG per tablet Take 1 tablet by mouth every 6 (six) hours as needed for moderate pain.   Yes Historical Provider, MD  ipratropium (ATROVENT) 0.03 % nasal spray Place 2 sprays into the nose every 12 (twelve) hours. 01/28/14  Yes Waymon Budge, MD  levothyroxine (SYNTHROID, LEVOTHROID) 75 MCG tablet Take 75 mcg by mouth daily.     Yes Historical Provider, MD  LORazepam (ATIVAN) 1 MG tablet Take 1 mg by mouth daily as needed for  anxiety.    Yes Historical Provider, MD  meclizine (ANTIVERT) 25 MG tablet Take 25 mg by mouth 3 (three) times daily as needed for dizziness.   Yes Historical Provider, MD  montelukast (SINGULAIR) 10 MG tablet Take 10 mg by mouth at bedtime.   Yes Historical Provider, MD  Multiple Vitamin (MULTIVITAMIN WITH MINERALS) TABS tablet Take 1 tablet by mouth daily.   Yes Historical Provider, MD  omega-3 acid ethyl esters (LOVAZA) 1 G capsule Take 1 g by mouth daily.   Yes Historical Provider, MD  traMADol (ULTRAM) 50 MG tablet Take 1 tablet (50 mg total) by mouth every 6 (six) hours as needed. 05/06/14   Vida RollerBrian D Dyshawn Cangelosi, MD   BP 160/57  Pulse 57  Temp(Src) 97.5 F (36.4 C) (Oral)  Resp 15  Wt 150 lb (68.04 kg)  SpO2 98% Physical Exam  Nursing note and vitals  reviewed. Constitutional: She appears well-developed and well-nourished. No distress.  HENT:  Head: Normocephalic and atraumatic.  Mouth/Throat: Oropharynx is clear and moist. No oropharyngeal exudate.  Eyes: Conjunctivae and EOM are normal. Pupils are equal, round, and reactive to light. Right eye exhibits no discharge. Left eye exhibits no discharge. No scleral icterus.  Neck: Normal range of motion. Neck supple. No JVD present. No thyromegaly present.  Cardiovascular: Normal rate, regular rhythm and intact distal pulses.  Exam reveals no gallop and no friction rub.   Murmur (late systolic murmur) heard. Normal pulses at the left foot  Pulmonary/Chest: Effort normal and breath sounds normal. No respiratory distress. She has no wheezes. She has no rales.  Abdominal: Soft. Bowel sounds are normal. She exhibits no distension and no mass. There is no tenderness.  Musculoskeletal: She exhibits tenderness ( Tenderness with range of motion of the left hip, left lower extremity is shortened and internally rotated). She exhibits no edema.  Upper extremities and right lower extremity with normal extremity exam, soft compartments, supple joints except for the left hip  Lymphadenopathy:    She has no cervical adenopathy.  Neurological: She is alert. Coordination normal.  Normal strength and sensation of the left foot and knee  Skin: Skin is warm and dry. No rash noted. No erythema.  Psychiatric: She has a normal mood and affect. Her behavior is normal.    ED Course  Procedures (including critical care time) Labs Review Labs Reviewed  I-STAT CHEM 8, ED - Abnormal; Notable for the following:    Glucose, Bld 104 (*)    All other components within normal limits  CBC    Imaging Review Dg Pelvis 1-2 Views  05/06/2014   CLINICAL DATA:  Status post reduction of left hip dislocation.  EXAM: PELVIS - 1-2 VIEW  COMPARISON:  Left hip radiographs performed earlier today at 12:36 a.m.  FINDINGS: There has  been successful reduction of the patient's previously noted left hip prosthesis dislocation. Both prostheses demonstrate grossly normal alignment, and are unremarkable in appearance, though incompletely imaged on this study. The screw of the left-sided prosthesis extends into the left pelvic sidewall. No definite loosening is seen. Postoperative change is noted at the pubic symphysis.  There is no evidence of fracture. The visualized bowel gas pattern is grossly unremarkable.  IMPRESSION: Successful reduction of previously noted left hip prosthesis dislocation. No evidence of fracture or loosening.   Electronically Signed   By: Roanna RaiderJeffery  Chang M.D.   On: 05/06/2014 02:35   Dg Pelvis 1-2 Views  05/06/2014   CLINICAL DATA:  Rolled over in bed.  Left hip pain.  EXAM: PELVIS - 1-2 VIEW  COMPARISON:  None.  FINDINGS: There is superior dislocation of the patient's left hip arthroplasty. No fracture is seen. There is no evidence of loosening. The visualized bilateral prostheses are otherwise grossly unremarkable. Postoperative change is noted about the pubic symphysis.  Minimal sclerotic change is seen at the sacroiliac joints. The visualized bowel gas pattern is grossly unremarkable.  IMPRESSION: Superior dislocation of the patient's left hip arthroplasty. No fracture seen. No evidence of loosening.   Electronically Signed   By: Roanna Raider M.D.   On: 05/06/2014 01:09      MDM   Final diagnoses:  Hip dislocation, left, initial encounter  Heart murmur    The patient was noted to have mild aortic stenosis an echocardiogram 5 years ago, she will need referral for repeat examination as this murmur is loud and likely represents worsening stenosis or other finding that would need cardiology evaluation and treatment. At this time she is not acutely ill from any cardiac disease or decompensation.  Procedure:  Dislocation Reduction of leftleft Hip  Consent:  Description of the procedures as well as Risks of  procedure as well as the alternatives and risks of each were explained to the (patient/caregiver).  who has verbally expressed their understanding.   written consent given by  the patient's son.  Imaging reviewed, anatomic site of dislocation identified and marked, Patient identity confirmed by arm band and with hospital identification number as well as verbally with patient.  Time Out performed at 1:07.  Patient was prepped and draped in the usual sterile fashion. Sedation used Yes.  .  Sedation type: Moderate.  Type of Sedation used: fentanyl and propofol. Total sedation time: 15 min.  Reduction method: manipulation / traction.  Vitals were monitored through the procedure.  Complications: none.  Pt tolerated the procedure without complaints and returned to baseline without difficulty.  Post reduction images were obtained confirming successful reduction of dislocation.  Immobilization applied, Neurovascular status reevaluated and is good with normal pulses, sensation and good capillary refill of the affected extremity.  Procedural sedation Performed by: Vida Roller Consent: Verbal consent obtained. Risks and benefits: risks, benefits and alternatives were discussed Required items: required blood products, implants, devices, and special equipment available Patient identity confirmed: arm band and provided demographic data Time out: Immediately prior to procedure a "time out" was called to verify the correct patient, procedure, equipment, support staff and site/side marked as required.  Sedation type: moderate (conscious) sedation NPO time confirmed and considedered  Sedatives: PROPOFOL, Fentanyl, Versed  Physician Time at Bedside: 15  Vitals: Vital signs were monitored during sedation. Cardiac Monitor, pulse oximeter Patient tolerance: Patient tolerated the procedure well with no immediate complications. Comments: Pt with uneventful recovered. Returned to pre-procedural sedation  baseline  Postreduction films show successful reduction, patient and family members informed of post reduction treatment course include a knee immobilizer and outpatient orthopedic followup, they are in agreement. The patient has a walker at home and should be able to angulate without difficulty with her knee immobilizer.   Meds given in ED:  Medications  fentaNYL (SUBLIMAZE) injection 100 mcg (100 mcg Intravenous Given 05/06/14 0104)  propofol (DIPRIVAN) 10 mg/mL bolus/IV push 100 mg (0 mg Intravenous Stopped 05/06/14 0113)  midazolam (VERSED) injection 2 mg (2 mg Intravenous Given 05/06/14 0116)  propofol (DIPRIVAN) 10 mg/mL bolus/IV push ( Intravenous Stopped 05/06/14 0113)  midazolam (VERSED) injection (2 mg Intravenous Given 05/06/14 0116)    New  Prescriptions   TRAMADOL (ULTRAM) 50 MG TABLET    Take 1 tablet (50 mg total) by mouth every 6 (six) hours as needed.        Vida Roller, MD 05/06/14 [phone]

## 2014-05-07 ENCOUNTER — Inpatient Hospital Stay (HOSPITAL_COMMUNITY)
Admission: EM | Admit: 2014-05-07 | Discharge: 2014-05-10 | DRG: 064 | Disposition: A | Discharge status: Skilled Nursing Facility | Payer: Medicare Other | Attending: Internal Medicine | Admitting: Internal Medicine

## 2014-05-07 ENCOUNTER — Encounter (HOSPITAL_COMMUNITY): Payer: Self-pay | Admitting: Emergency Medicine

## 2014-05-07 ENCOUNTER — Emergency Department (HOSPITAL_COMMUNITY): Payer: Medicare Other

## 2014-05-07 DIAGNOSIS — G8190 Hemiplegia, unspecified affecting unspecified side: Secondary | ICD-10-CM | POA: Diagnosis present

## 2014-05-07 DIAGNOSIS — M129 Arthropathy, unspecified: Secondary | ICD-10-CM | POA: Diagnosis present

## 2014-05-07 DIAGNOSIS — M545 Low back pain, unspecified: Secondary | ICD-10-CM | POA: Diagnosis present

## 2014-05-07 DIAGNOSIS — T84029A Dislocation of unspecified internal joint prosthesis, initial encounter: Secondary | ICD-10-CM | POA: Diagnosis present

## 2014-05-07 DIAGNOSIS — M810 Age-related osteoporosis without current pathological fracture: Secondary | ICD-10-CM | POA: Diagnosis present

## 2014-05-07 DIAGNOSIS — I359 Nonrheumatic aortic valve disorder, unspecified: Secondary | ICD-10-CM | POA: Diagnosis present

## 2014-05-07 DIAGNOSIS — G8929 Other chronic pain: Secondary | ICD-10-CM | POA: Diagnosis present

## 2014-05-07 DIAGNOSIS — E039 Hypothyroidism, unspecified: Secondary | ICD-10-CM | POA: Diagnosis present

## 2014-05-07 DIAGNOSIS — G936 Cerebral edema: Secondary | ICD-10-CM | POA: Diagnosis present

## 2014-05-07 DIAGNOSIS — X58XXXA Exposure to other specified factors, initial encounter: Secondary | ICD-10-CM | POA: Diagnosis present

## 2014-05-07 DIAGNOSIS — I63431 Cerebral infarction due to embolism of right posterior cerebral artery: Secondary | ICD-10-CM

## 2014-05-07 DIAGNOSIS — Z96649 Presence of unspecified artificial hip joint: Secondary | ICD-10-CM | POA: Diagnosis not present

## 2014-05-07 DIAGNOSIS — E785 Hyperlipidemia, unspecified: Secondary | ICD-10-CM | POA: Diagnosis present

## 2014-05-07 DIAGNOSIS — T84029D Dislocation of unspecified internal joint prosthesis, subsequent encounter: Secondary | ICD-10-CM

## 2014-05-07 DIAGNOSIS — Z87898 Personal history of other specified conditions: Secondary | ICD-10-CM

## 2014-05-07 DIAGNOSIS — I6350 Cerebral infarction due to unspecified occlusion or stenosis of unspecified cerebral artery: Secondary | ICD-10-CM

## 2014-05-07 DIAGNOSIS — I639 Cerebral infarction, unspecified: Secondary | ICD-10-CM | POA: Diagnosis present

## 2014-05-07 DIAGNOSIS — I6340 Cerebral infarction due to embolism of unspecified cerebral artery: Principal | ICD-10-CM | POA: Diagnosis present

## 2014-05-07 LAB — CBC
HEMATOCRIT: 43.4 % (ref 36.0–46.0)
Hemoglobin: 14.4 g/dL (ref 12.0–15.0)
MCH: 29.9 pg (ref 26.0–34.0)
MCHC: 33.2 g/dL (ref 30.0–36.0)
MCV: 90.2 fL (ref 78.0–100.0)
Platelets: 256 10*3/uL (ref 150–400)
RBC: 4.81 MIL/uL (ref 3.87–5.11)
RDW: 13 % (ref 11.5–15.5)
WBC: 5.4 10*3/uL (ref 4.0–10.5)

## 2014-05-07 LAB — I-STAT CHEM 8, ED
BUN: 14 mg/dL (ref 6–23)
Calcium, Ion: 1.09 mmol/L — ABNORMAL LOW (ref 1.13–1.30)
Chloride: 102 mEq/L (ref 96–112)
Creatinine, Ser: 0.6 mg/dL (ref 0.50–1.10)
GLUCOSE: 107 mg/dL — AB (ref 70–99)
HCT: 43 % (ref 36.0–46.0)
HEMOGLOBIN: 14.6 g/dL (ref 12.0–15.0)
Potassium: 4.2 mEq/L (ref 3.7–5.3)
Sodium: 134 mEq/L — ABNORMAL LOW (ref 137–147)
TCO2: 25 mmol/L (ref 0–100)

## 2014-05-07 LAB — BASIC METABOLIC PANEL
Anion gap: 16 — ABNORMAL HIGH (ref 5–15)
BUN: 13 mg/dL (ref 6–23)
CO2: 23 meq/L (ref 19–32)
Calcium: 9.2 mg/dL (ref 8.4–10.5)
Chloride: 97 mEq/L (ref 96–112)
Creatinine, Ser: 0.65 mg/dL (ref 0.50–1.10)
GFR calc Af Amer: 88 mL/min — ABNORMAL LOW (ref >90)
GFR, EST NON AFRICAN AMERICAN: 76 mL/min — AB (ref >90)
Glucose, Bld: 98 mg/dL (ref 70–99)
Potassium: 4.4 mEq/L (ref 3.7–5.3)
Sodium: 136 mEq/L — ABNORMAL LOW (ref 137–147)

## 2014-05-07 MED ORDER — LORATADINE 10 MG PO TABS
10.0000 mg | ORAL_TABLET | Freq: Every day | ORAL | Status: DC
Start: 2014-05-08 — End: 2014-05-10
  Administered 2014-05-08 – 2014-05-10 (×3): 10 mg via ORAL
  Filled 2014-05-07 (×3): qty 1

## 2014-05-07 MED ORDER — ESTRADIOL 2 MG VA RING: 2.0000 mg | VAGINAL_RING | VAGINAL | Status: DC

## 2014-05-07 MED ORDER — HYDROCODONE-ACETAMINOPHEN 5-325 MG PO TABS: 1.0000 | ORAL_TABLET | Freq: Four times a day (QID) | ORAL | Status: DC | PRN

## 2014-05-07 MED ORDER — ENOXAPARIN SODIUM 30 MG/0.3ML ~~LOC~~ SOLN
30.0000 mg | SUBCUTANEOUS | Status: DC
  Administered 2014-05-08 – 2014-05-10 (×3): 30 mg via SUBCUTANEOUS
  Filled 2014-05-07 (×3): qty 0.3

## 2014-05-07 MED ORDER — LORAZEPAM 0.5 MG PO TABS
0.5000 mg | ORAL_TABLET | Freq: Two times a day (BID) | ORAL | Status: DC
  Administered 2014-05-07 – 2014-05-10 (×6): 0.5 mg via ORAL
  Filled 2014-05-07 (×6): qty 1

## 2014-05-07 MED ORDER — SENNOSIDES-DOCUSATE SODIUM 8.6-50 MG PO TABS
1.0000 | ORAL_TABLET | Freq: Every evening | ORAL | Status: DC | PRN
  Administered 2014-05-09: 1 via ORAL
  Filled 2014-05-07: qty 1

## 2014-05-07 MED ORDER — MONTELUKAST SODIUM 10 MG PO TABS
10.0000 mg | ORAL_TABLET | Freq: Every day | ORAL | Status: DC
  Administered 2014-05-07 – 2014-05-09 (×3): 10 mg via ORAL
  Filled 2014-05-07 (×3): qty 1

## 2014-05-07 MED ORDER — LEVOTHYROXINE SODIUM 75 MCG PO TABS
75.0000 ug | ORAL_TABLET | Freq: Every day | ORAL | Status: DC
  Administered 2014-05-08 – 2014-05-10 (×3): 75 ug via ORAL
  Filled 2014-05-07: qty 1
  Filled 2014-05-07: qty 3
  Filled 2014-05-07 (×2): qty 1
  Filled 2014-05-07: qty 3
  Filled 2014-05-07: qty 1
  Filled 2014-05-07: qty 3

## 2014-05-07 MED ORDER — ASPIRIN EC 325 MG PO TBEC
325.0000 mg | DELAYED_RELEASE_TABLET | Freq: Every day | ORAL | Status: DC
  Administered 2014-05-08 – 2014-05-09 (×2): 325 mg via ORAL
  Filled 2014-05-07 (×2): qty 1

## 2014-05-07 MED ORDER — STROKE: EARLY STAGES OF RECOVERY BOOK
Freq: Once | Status: DC
  Filled 2014-05-07 (×2): qty 1

## 2014-05-07 MED ORDER — MECLIZINE HCL 12.5 MG PO TABS: 25.0000 mg | ORAL_TABLET | Freq: Three times a day (TID) | ORAL | Status: DC | PRN

## 2014-05-07 NOTE — ED Notes (Addendum)
She c/o numbness in the L side of her body for the past several days. She denies any pain. Speech is clear, no slurred speech or facial droop. L arm drift noted

## 2014-05-07 NOTE — ED Notes (Signed)
Neurologist at bedside. 

## 2014-05-07 NOTE — ED Notes (Signed)
Attempted report 

## 2014-05-07 NOTE — ED Notes (Signed)
Lanice SchwabGail, PA-C at bedside

## 2014-05-07 NOTE — Progress Notes (Signed)
Received report from RaytheonElizabeth RN at [phone]. Room ready and awaiting arrival of pt to room.

## 2014-05-07 NOTE — ED Provider Notes (Signed)
CSN: [id number]     Arrival date & time 05/07/14  1703 History   First MD Initiated Contact with Patient 05/07/14 2002     Chief Complaint  Patient presents with  . Numbness     (Consider location/radiation/quality/duration/timing/severity/associated sxs/prior Treatment) HPI Comments: This is a 80 year old female, who lives in an assisted living facility, reports, that she's had numbness of bilateral feet for the past 4, days this has waxed and waned in intensity 2, days, ago.  She was seen in the emergency department for a dislocated hip prosthesis.  That was relocated shortly after that, she developed numbness and tingling to the left side of her body.  She was seen by her orthopedist as a followup.  He did not feel that the numbness in her arm and face, and lips was related to her hip relocation and recommend that she followup with her primary care physician.  She was seen by her primary care physician and asked to come back in a 24-hour for a recheck.  Her assisted facility felt it was more important that she be seen by a specialist at the, orthopedist, and canceled her primary care recheck a followup visit and was seen by a different orthopedic surgeon, who did not feel that her numbness and tingling in her arm, and lips, pus from her back and recommend she come to the emergency department.  The history is provided by the patient and a relative.    Past Medical History  Diagnosis Date  . Deviated nasal septum   . Dysfunction of eustachian tube   . Unspecified sinusitis (chronic)   . Allergic rhinitis, cause unspecified   . Arthritis   . Cataract   . Osteoporosis   . Hx of syncope     had loop recorder for 2 years now explanted, only PACs noted on device for over 2 years, no a fib, Echo 08/18/09 EF >55%mild concentric LVHnormal myoview, normal carotid dopplers 2010  . Pruritus of skin     poss. related to Losartan  . Dyslipidemia   . Normal cardiac stress test     lexiscan myoview  04/08/08   Past Surgical History  Procedure Laterality Date  . Tonsillectomy    . Total hip arthroplasty      bilateral  . Pelvic sling    . Joint replacement    . Loop recorder implant  07/2009    to eval syncope  . Loop recorder explant  01/12/2012   Family History  Problem Relation Age of Onset  . Cancer Father    History  Substance Use Topics  . Smoking status: Never Smoker   . Smokeless tobacco: Not on file  . Alcohol Use: No   OB History   Grav Para Term Preterm Abortions TAB SAB Ect Mult Living                 Review of Systems  Constitutional: Negative for fever and chills.  HENT: Negative for drooling, facial swelling, rhinorrhea and sore throat.   Cardiovascular: Negative for chest pain.  Musculoskeletal: Positive for neck pain. Negative for back pain.  Skin: Negative for pallor and rash.  Neurological: Positive for numbness. Negative for dizziness, weakness and headaches.  All other systems reviewed and are negative.     Allergies  Augmentin  Home Medications   Prior to Admission medications   Medication Sig Start Date End Date Taking? Authorizing Provider  cetirizine (ZYRTEC) 10 MG tablet Take 10 mg by mouth daily.  Yes Historical Provider, MD  estradiol (ESTRING) 2 MG vaginal ring Place 2 mg vaginally every 3 (three) months. follow package directions   Yes Historical Provider, MD  HYDROcodone-acetaminophen (NORCO/VICODIN) 5-325 MG per tablet Take 1 tablet by mouth every 6 (six) hours as needed for moderate pain.   Yes Historical Provider, MD  levothyroxine (SYNTHROID, LEVOTHROID) 75 MCG tablet Take 75 mcg by mouth daily.     Yes Historical Provider, MD  LORazepam (ATIVAN) 1 MG tablet Take 0.5 mg by mouth 2 (two) times daily.    Yes Historical Provider, MD  meclizine (ANTIVERT) 25 MG tablet Take 25 mg by mouth 3 (three) times daily as needed for dizziness.   Yes Historical Provider, MD  montelukast (SINGULAIR) 10 MG tablet Take 10 mg by mouth at  bedtime.   Yes Historical Provider, MD   BP 140/69  Pulse 64  Temp(Src) 98.3 F (36.8 C) (Oral)  Resp 22  Ht 5\' 4"  (1.626 m)  Wt 150 lb (68.04 kg)  BMI 25.73 kg/m2  SpO2 95% Physical Exam  Nursing note and vitals reviewed. Constitutional: She is oriented to person, place, and time. She appears well-developed and well-nourished.  HENT:  Head: Normocephalic.  Eyes: Pupils are equal, round, and reactive to light.  Neck: Normal range of motion.  Cardiovascular: Normal rate.   Pulmonary/Chest: Effort normal.  Abdominal: Soft.  Genitourinary: Vagina normal.  Musculoskeletal: Normal range of motion. She exhibits no edema and no tenderness.  Neurological: She is alert and oriented to person, place, and time. She has normal strength. No cranial nerve deficit or sensory deficit.  Unable to ambulate to due to brace on hip Slight L arm drift    ED Course  Procedures (including critical care time) Labs Review Labs Reviewed  BASIC METABOLIC PANEL - Abnormal; Notable for the following:    Sodium 136 (*)    GFR calc non Af Amer 76 (*)    GFR calc Af Amer 88 (*)    Anion gap 16 (*)    All other components within normal limits  I-STAT CHEM 8, ED - Abnormal; Notable for the following:    Sodium 134 (*)    Glucose, Bld 107 (*)    Calcium, Ion 1.09 (*)    All other components within normal limits  CBC    Imaging Review Dg Pelvis 1-2 Views  05/06/2014   CLINICAL DATA:  Status post reduction of left hip dislocation.  EXAM: PELVIS - 1-2 VIEW  COMPARISON:  Left hip radiographs performed earlier today at 12:36 a.m.  FINDINGS: There has been successful reduction of the patient's previously noted left hip prosthesis dislocation. Both prostheses demonstrate grossly normal alignment, and are unremarkable in appearance, though incompletely imaged on this study. The screw of the left-sided prosthesis extends into the left pelvic sidewall. No definite loosening is seen. Postoperative change is noted  at the pubic symphysis.  There is no evidence of fracture. The visualized bowel gas pattern is grossly unremarkable.  IMPRESSION: Successful reduction of previously noted left hip prosthesis dislocation. No evidence of fracture or loosening.   Electronically Signed   By: Roanna Raider M.D.   On: 05/06/2014 02:35   Dg Pelvis 1-2 Views  05/06/2014   CLINICAL DATA:  Rolled over in bed.  Left hip pain.  EXAM: PELVIS - 1-2 VIEW  COMPARISON:  None.  FINDINGS: There is superior dislocation of the patient's left hip arthroplasty. No fracture is seen. There is no evidence of loosening. The visualized bilateral  prostheses are otherwise grossly unremarkable. Postoperative change is noted about the pubic symphysis.  Minimal sclerotic change is seen at the sacroiliac joints. The visualized bowel gas pattern is grossly unremarkable.  IMPRESSION: Superior dislocation of the patient's left hip arthroplasty. No fracture seen. No evidence of loosening.   Electronically Signed   By: Roanna Raider M.D.   On: 05/06/2014 01:09   Ct Head Wo Contrast  05/07/2014   CLINICAL DATA:  Left-sided arm a numbness.  EXAM: CT HEAD WITHOUT CONTRAST  CT CERVICAL SPINE WITHOUT CONTRAST  TECHNIQUE: Multidetector CT imaging of the head and cervical spine was performed following the standard protocol without intravenous contrast. Multiplanar CT image reconstructions of the cervical spine were also generated.  COMPARISON:  Head CT from 02/05/2008.  FINDINGS: CT HEAD FINDINGS  There is no evidence for acute hemorrhage, hydrocephalus, midline shift, or abnormal extra-axial fluid collection. Low attenuation in the medial right occipital lobe is new in the interval and raises concern for right occipital lobe acute to subacute infarct. Edema from an un seen underlying mass is considered less likely but not excluded.  Chronic lacunar infarct is seen in the anterior limb of the right internal capsule, as before. Diffuse loss of parenchymal volume is  consistent with atrophy. Patchy low attenuation in the deep hemispheric and periventricular white matter is nonspecific, but likely reflects chronic microvascular ischemic demyelination.  The visualized paranasal sinuses and mastoid air cells are clear. Fluid and posterior mastoid air cells is unchanged in the long interval since the prior study.  CT CERVICAL SPINE FINDINGS  Imaging was obtained from the skullbase through the T2-3 interspace. No evidence for fracture. Three-4 mm anterolisthesis of C7 on T1 is likely related to the advanced facet arthropathy at the same level. There is degenerative disc and endplate disease at C4-5, C5-6, and C6-7. The facets are aligned bilaterally. Mild left-sided foraminal encroachment at C4-5 is secondary to uncinate spurring and facet overgrowth. Moderate right-sided foraminal stenosis at C5-6 is secondary to uncinate spurring and facet overgrowth. The Normal cervical lordosis is preserved. There is no prevertebral soft tissue swelling.  IMPRESSION: Probable acute to subacute right occipital infarct without evidence of hemorrhage. Wallace Cullens matter involvement is not well demonstrated raising the question that this could be related to vasogenic edema. MRI could be used to further evaluate given the possibility that this is related to edema and not ischemia, intravenous contrast material is recommended if that study is performed.  Degenerative changes in the cervical spine without evidence of an acute fracture.  I discussed these findings by telephone with the nurse practitioner, Sabino Dick, at 2139 hrs on 05/07/2014.   Electronically Signed   By: Kennith Center M.D.   On: 05/07/2014 21:40   Ct Cervical Spine Wo Contrast  05/07/2014   CLINICAL DATA:  Left-sided arm a numbness.  EXAM: CT HEAD WITHOUT CONTRAST  CT CERVICAL SPINE WITHOUT CONTRAST  TECHNIQUE: Multidetector CT imaging of the head and cervical spine was performed following the standard protocol without intravenous  contrast. Multiplanar CT image reconstructions of the cervical spine were also generated.  COMPARISON:  Head CT from 02/05/2008.  FINDINGS: CT HEAD FINDINGS  There is no evidence for acute hemorrhage, hydrocephalus, midline shift, or abnormal extra-axial fluid collection. Low attenuation in the medial right occipital lobe is new in the interval and raises concern for right occipital lobe acute to subacute infarct. Edema from an un seen underlying mass is considered less likely but not excluded.  Chronic lacunar infarct  is seen in the anterior limb of the right internal capsule, as before. Diffuse loss of parenchymal volume is consistent with atrophy. Patchy low attenuation in the deep hemispheric and periventricular white matter is nonspecific, but likely reflects chronic microvascular ischemic demyelination.  The visualized paranasal sinuses and mastoid air cells are clear. Fluid and posterior mastoid air cells is unchanged in the long interval since the prior study.  CT CERVICAL SPINE FINDINGS  Imaging was obtained from the skullbase through the T2-3 interspace. No evidence for fracture. Three-4 mm anterolisthesis of C7 on T1 is likely related to the advanced facet arthropathy at the same level. There is degenerative disc and endplate disease at C4-5, C5-6, and C6-7. The facets are aligned bilaterally. Mild left-sided foraminal encroachment at C4-5 is secondary to uncinate spurring and facet overgrowth. Moderate right-sided foraminal stenosis at C5-6 is secondary to uncinate spurring and facet overgrowth. The Normal cervical lordosis is preserved. There is no prevertebral soft tissue swelling.  IMPRESSION: Probable acute to subacute right occipital infarct without evidence of hemorrhage. Wallace Cullens matter involvement is not well demonstrated raising the question that this could be related to vasogenic edema. MRI could be used to further evaluate given the possibility that this is related to edema and not ischemia,  intravenous contrast material is recommended if that study is performed.  Degenerative changes in the cervical spine without evidence of an acute fracture.  I discussed these findings by telephone with the nurse practitioner, Sabino Dick, at 2139 hrs on 05/07/2014.   Electronically Signed   By: Kennith Center M.D.   On: 05/07/2014 21:40     EKG Interpretation None     Facial asymmetry, no tongue deviation, equal sensation, but feels as though arm and leg are heavy  CT scan resulted.  There is some concern for an ischemic area with surrounding edema.  This is unusual for a truly ischemic stroke, and there is concern for a small mass that we may be missing.  Radiology is requesting that patient have an MRI tomorrow, that she be admitted. I have discussed this case with Dr. [facility] neurologist.  He will evaluate the patient.  Have also discussed the patient.  Admission, with Dr. Alvester Morin. I've spoken with the patient and her son about the necessity for admission and the CT scan results MDM   Final diagnoses:  Stroke        Arman Filter, NP 05/07/14 2218

## 2014-05-07 NOTE — H&P (Addendum)
Hospitalist Admission History and Physical  Patient name: [name] Medical record number: [id number] Date of birth: 1922/07/16 Age: 80 y.o. Gender: female  Primary Care Provider: Pearson Grippe, MD  Chief Complaint: CVA  History of Present Illness:This is a 80 y.o. year old female with significant past medical history of chronic low back pain, chronic hip s/p bilateral hip arthroplasty with recent hip dislocation s/p reduction under anesthesia presenting with CVA. Pt currently lives in a senior community with combined independent living, assisted-living, skilled nursing. Patient has been living independently up until recently. Her son, patient has had recurrent episodes of bilateral lower tremor the numbness and paresthesias as well as left upper extremity numbness and paresthesias. Patient had a spontaneous dislocation of her left hip over the weekend. Had this reduced by orthopedics and anesthesia on Monday. Symptoms were present prior to spontaneous dislocation. Was seen by PCP for symptoms of paresthesias. Was recommended patient followup for evaluation. However, the scene community next the patient's orthopedic visit. During this visit, orthopedics felt that patient needed to be further evaluated for her symptoms. Patient was redirected to the ER for further evaluation. On presentation, hemodynamically stable. Afebrile. S and within normal limits. Head CT obtained shows probable acute to subacute right occipital infarct without evidence of hemorrhage however no significant grave matter of all meds with a question of vasogenic edema. Degenerative changes were noted on the cervical spine. Hip x-ray within normal limits. Patient and son deny any history of stroke or malignancy in the past. Denies any recent infections.  Assessment and Plan: [name] is a 80 y.o. year old female presenting with CVA  CVA: Stroke protocol. Follow up on neuro recs given ? Vasogenic edema on CT. Defer  starting antiplatelet to neuro. MRI, MRA, carotid dopplers, 2D ECHO, risk stratification labs. Tele bed.   Hypothyroidism: continue synthroid.   Hip reduction: stable currently. Continue to follow.   FEN/GI: heart healthy diet pending bedside swallow eval.  Prophylaxis: lovenox  Disposition: pending further evaluation  Code Status:Full Code    Patient Active Problem List   Diagnosis Date Noted  . CVA (cerebral infarction) 05/07/2014  . Thrush 08/10/2013  . Chronic low back pain 12/28/2012  . Dyslipidemia   . Hx of syncope   . EUSTACHIAN TUBE DYSFUNCTION 08/11/2007  . Nonallergic rhinitis 08/11/2007   Past Medical History: Past Medical History  Diagnosis Date  . Deviated nasal septum   . Dysfunction of eustachian tube   . Unspecified sinusitis (chronic)   . Allergic rhinitis, cause unspecified   . Arthritis   . Cataract   . Osteoporosis   . Hx of syncope     had loop recorder for 2 years now explanted, only PACs noted on device for over 2 years, no a fib, Echo 08/18/09 EF >55%mild concentric LVHnormal myoview, normal carotid dopplers 2010  . Pruritus of skin     poss. related to Losartan  . Dyslipidemia   . Normal cardiac stress test     lexiscan myoview 04/08/08    Past Surgical History: Past Surgical History  Procedure Laterality Date  . Tonsillectomy    . Total hip arthroplasty      bilateral  . Pelvic sling    . Joint replacement    . Loop recorder implant  07/2009    to eval syncope  . Loop recorder explant  01/12/2012    Social History: History   Social History  . Marital Status: Widowed    Spouse Name: N/A  Number of Children: N/A  . Years of Education: N/A   Social History Main Topics  . Smoking status: Never Smoker   . Smokeless tobacco: None  . Alcohol Use: No  . Drug Use: No  . Sexual Activity: No   Other Topics Concern  . None   Social History Narrative  . None    Family History: Family History  Problem Relation Age of Onset  .  Cancer Father     Allergies: Allergies  Allergen Reactions  . Augmentin [Amoxicillin-Pot Clavulanate] Nausea Only    Current Facility-Administered Medications  Medication Dose Route Frequency Provider Last Rate Last Dose  .  stroke: mapping our early stages of recovery book   Does not apply Once [name] Albee, MD      . enoxaparin (LOVENOX) injection 30 mg  30 mg Subcutaneous Q24H [name] Albee, MD      . estradiol (ESTRING) vaginal ring 2 mg  2 mg Vaginal Q90 days [name] Albee, MD      . HYDROcodone-acetaminophen (NORCO/VICODIN) 5-325 MG per tablet 1 tablet  1 tablet Oral Q6H PRN [name] Albee, MD      . [name] ON 05/08/2014] levothyroxine (SYNTHROID, LEVOTHROID) tablet 75 mcg  75 mcg Oral Daily [name] Albee, MD      . [name] ON 05/08/2014] loratadine (CLARITIN) tablet 10 mg  10 mg Oral Daily [name] Albee, MD      . LORazepam (ATIVAN) tablet 0.5 mg  0.5 mg Oral BID [name] Albee, MD      . meclizine (ANTIVERT) tablet 25 mg  25 mg Oral TID PRN [name] Albee, MD      . montelukast (SINGULAIR) tablet 10 mg  10 mg Oral QHS [name] Albee, MD      . senna-docusate (Senokot-S) tablet 1 tablet  1 tablet Oral QHS PRN [name] Albee, MD       Current Outpatient Prescriptions  Medication Sig Dispense Refill  . cetirizine (ZYRTEC) 10 MG tablet Take 10 mg by mouth daily.      [name] estradiol (ESTRING) 2 MG vaginal ring Place 2 mg vaginally every 3 (three) months. follow package directions      . HYDROcodone-acetaminophen (NORCO/VICODIN) 5-325 MG per tablet Take 1 tablet by mouth every 6 (six) hours as needed for moderate pain.      [name] levothyroxine (SYNTHROID, LEVOTHROID) 75 MCG tablet Take 75 mcg by mouth daily.        [name] LORazepam (ATIVAN) 1 MG tablet Take 0.5 mg by mouth 2 (two) times daily.       . meclizine (ANTIVERT) 25 MG tablet Take 25 mg by mouth 3 (three) times daily as needed for dizziness.      . montelukast (SINGULAIR) 10 MG tablet Take 10 mg by mouth at bedtime.       Review Of Systems:  12 point ROS negative except as noted above in HPI.  Physical Exam: Filed Vitals:   05/07/14 2130  BP: 140/69  Pulse: 64  Temp:   Resp: 22    General: alert and cooperative HEENT: PERRLA and extra ocular movement intact Heart: S1, S2 normal, no murmur, rub or gallop, regular rate and rhythm Lungs: clear to auscultation, no wheezes or rales and unlabored breathing Abdomen: abdomen is soft without significant tenderness, masses, organomegaly or guarding Extremities: extremities normal, atraumatic, no cyanosis or edema Skin:no rashes, no ecchymoses Neurology: normal without focal findings and mental status, speech normal, alert and oriented x3  Labs and Imaging: Lab Results  Component Value Date/Time  NA 134* 05/07/2014  8:46 PM   K 4.2 05/07/2014  8:46 PM   CL 102 05/07/2014  8:46 PM   CO2 23 05/07/2014  5:20 PM   BUN 14 05/07/2014  8:46 PM   CREATININE 0.60 05/07/2014  8:46 PM   GLUCOSE 107* 05/07/2014  8:46 PM   Lab Results  Component Value Date   WBC 5.4 05/07/2014   HGB 14.6 05/07/2014   HCT 43.0 05/07/2014   MCV 90.2 05/07/2014   PLT 256 05/07/2014    Dg Pelvis 1-2 Views  05/06/2014   CLINICAL DATA:  Status post reduction of left hip dislocation.  EXAM: PELVIS - 1-2 VIEW  COMPARISON:  Left hip radiographs performed earlier today at 12:36 a.m.  FINDINGS: There has been successful reduction of the patient's previously noted left hip prosthesis dislocation. Both prostheses demonstrate grossly normal alignment, and are unremarkable in appearance, though incompletely imaged on this study. The screw of the left-sided prosthesis extends into the left pelvic sidewall. No definite loosening is seen. Postoperative change is noted at the pubic symphysis.  There is no evidence of fracture. The visualized bowel gas pattern is grossly unremarkable.  IMPRESSION: Successful reduction of previously noted left hip prosthesis dislocation. No evidence of fracture or loosening.   Electronically Signed    By: Roanna Raider M.D.   On: 05/06/2014 02:35   Dg Pelvis 1-2 Views  05/06/2014   CLINICAL DATA:  Rolled over in bed.  Left hip pain.  EXAM: PELVIS - 1-2 VIEW  COMPARISON:  None.  FINDINGS: There is superior dislocation of the patient's left hip arthroplasty. No fracture is seen. There is no evidence of loosening. The visualized bilateral prostheses are otherwise grossly unremarkable. Postoperative change is noted about the pubic symphysis.  Minimal sclerotic change is seen at the sacroiliac joints. The visualized bowel gas pattern is grossly unremarkable.  IMPRESSION: Superior dislocation of the patient's left hip arthroplasty. No fracture seen. No evidence of loosening.   Electronically Signed   By: Roanna Raider M.D.   On: 05/06/2014 01:09   Ct Head Wo Contrast  05/07/2014   CLINICAL DATA:  Left-sided arm a numbness.  EXAM: CT HEAD WITHOUT CONTRAST  CT CERVICAL SPINE WITHOUT CONTRAST  TECHNIQUE: Multidetector CT imaging of the head and cervical spine was performed following the standard protocol without intravenous contrast. Multiplanar CT image reconstructions of the cervical spine were also generated.  COMPARISON:  Head CT from 02/05/2008.  FINDINGS: CT HEAD FINDINGS  There is no evidence for acute hemorrhage, hydrocephalus, midline shift, or abnormal extra-axial fluid collection. Low attenuation in the medial right occipital lobe is new in the interval and raises concern for right occipital lobe acute to subacute infarct. Edema from an un seen underlying mass is considered less likely but not excluded.  Chronic lacunar infarct is seen in the anterior limb of the right internal capsule, as before. Diffuse loss of parenchymal volume is consistent with atrophy. Patchy low attenuation in the deep hemispheric and periventricular white matter is nonspecific, but likely reflects chronic microvascular ischemic demyelination.  The visualized paranasal sinuses and mastoid air cells are clear. Fluid and posterior  mastoid air cells is unchanged in the long interval since the prior study.  CT CERVICAL SPINE FINDINGS  Imaging was obtained from the skullbase through the T2-3 interspace. No evidence for fracture. Three-4 mm anterolisthesis of C7 on T1 is likely related to the advanced facet arthropathy at the same level. There is degenerative disc and endplate disease at  C4-5, C5-6, and C6-7. The facets are aligned bilaterally. Mild left-sided foraminal encroachment at C4-5 is secondary to uncinate spurring and facet overgrowth. Moderate right-sided foraminal stenosis at C5-6 is secondary to uncinate spurring and facet overgrowth. The Normal cervical lordosis is preserved. There is no prevertebral soft tissue swelling.  IMPRESSION: Probable acute to subacute right occipital infarct without evidence of hemorrhage. Wallace CullensGray matter involvement is not well demonstrated raising the question that this could be related to vasogenic edema. MRI could be used to further evaluate given the possibility that this is related to edema and not ischemia, intravenous contrast material is recommended if that study is performed.  Degenerative changes in the cervical spine without evidence of an acute fracture.  I discussed these findings by telephone with the nurse practitioner, Sabino DickGail Shulz, at 2139 hrs on 05/07/2014.   Electronically Signed   By: Kennith CenterEric  Mansell M.D.   On: 05/07/2014 21:40   Ct Cervical Spine Wo Contrast  05/07/2014   CLINICAL DATA:  Left-sided arm a numbness.  EXAM: CT HEAD WITHOUT CONTRAST  CT CERVICAL SPINE WITHOUT CONTRAST  TECHNIQUE: Multidetector CT imaging of the head and cervical spine was performed following the standard protocol without intravenous contrast. Multiplanar CT image reconstructions of the cervical spine were also generated.  COMPARISON:  Head CT from 02/05/2008.  FINDINGS: CT HEAD FINDINGS  There is no evidence for acute hemorrhage, hydrocephalus, midline shift, or abnormal extra-axial fluid collection. Low  attenuation in the medial right occipital lobe is new in the interval and raises concern for right occipital lobe acute to subacute infarct. Edema from an un seen underlying mass is considered less likely but not excluded.  Chronic lacunar infarct is seen in the anterior limb of the right internal capsule, as before. Diffuse loss of parenchymal volume is consistent with atrophy. Patchy low attenuation in the deep hemispheric and periventricular white matter is nonspecific, but likely reflects chronic microvascular ischemic demyelination.  The visualized paranasal sinuses and mastoid air cells are clear. Fluid and posterior mastoid air cells is unchanged in the long interval since the prior study.  CT CERVICAL SPINE FINDINGS  Imaging was obtained from the skullbase through the T2-3 interspace. No evidence for fracture. Three-4 mm anterolisthesis of C7 on T1 is likely related to the advanced facet arthropathy at the same level. There is degenerative disc and endplate disease at C4-5, C5-6, and C6-7. The facets are aligned bilaterally. Mild left-sided foraminal encroachment at C4-5 is secondary to uncinate spurring and facet overgrowth. Moderate right-sided foraminal stenosis at C5-6 is secondary to uncinate spurring and facet overgrowth. The Normal cervical lordosis is preserved. There is no prevertebral soft tissue swelling.  IMPRESSION: Probable acute to subacute right occipital infarct without evidence of hemorrhage. Wallace CullensGray matter involvement is not well demonstrated raising the question that this could be related to vasogenic edema. MRI could be used to further evaluate given the possibility that this is related to edema and not ischemia, intravenous contrast material is recommended if that study is performed.  Degenerative changes in the cervical spine without evidence of an acute fracture.  I discussed these findings by telephone with the nurse practitioner, Sabino DickGail Shulz, at 2139 hrs on 05/07/2014.   Electronically  Signed   By: Kennith CenterEric  Mansell M.D.   On: 05/07/2014 21:40           [name] AlbeeSteven Ledora Delker MD  Pager: [phone]

## 2014-05-07 NOTE — ED Notes (Signed)
Hospitalist at bedside 

## 2014-05-08 ENCOUNTER — Inpatient Hospital Stay (HOSPITAL_COMMUNITY): Payer: Medicare Other

## 2014-05-08 DIAGNOSIS — Z96649 Presence of unspecified artificial hip joint: Secondary | ICD-10-CM

## 2014-05-08 DIAGNOSIS — I6340 Cerebral infarction due to embolism of unspecified cerebral artery: Principal | ICD-10-CM

## 2014-05-08 DIAGNOSIS — I359 Nonrheumatic aortic valve disorder, unspecified: Secondary | ICD-10-CM

## 2014-05-08 DIAGNOSIS — M545 Low back pain, unspecified: Secondary | ICD-10-CM

## 2014-05-08 DIAGNOSIS — Z5189 Encounter for other specified aftercare: Secondary | ICD-10-CM

## 2014-05-08 DIAGNOSIS — R209 Unspecified disturbances of skin sensation: Secondary | ICD-10-CM

## 2014-05-08 DIAGNOSIS — G8929 Other chronic pain: Secondary | ICD-10-CM

## 2014-05-08 DIAGNOSIS — T84029A Dislocation of unspecified internal joint prosthesis, initial encounter: Secondary | ICD-10-CM

## 2014-05-08 DIAGNOSIS — E785 Hyperlipidemia, unspecified: Secondary | ICD-10-CM

## 2014-05-08 LAB — TSH: TSH: 1.59 u[IU]/mL (ref 0.350–4.500)

## 2014-05-08 LAB — LIPID PANEL
CHOL/HDL RATIO: 3.6 ratio
Cholesterol: 198 mg/dL (ref 0–200)
HDL: 55 mg/dL (ref >39)
LDL Cholesterol: 126 mg/dL — ABNORMAL HIGH (ref 0–99)
Triglycerides: 86 mg/dL (ref <150)
VLDL: 17 mg/dL (ref 0–40)

## 2014-05-08 LAB — HEMOGLOBIN A1C
Hgb A1c MFr Bld: 5.8 % — ABNORMAL HIGH (ref <5.7)
MEAN PLASMA GLUCOSE: 120 mg/dL — AB (ref <117)

## 2014-05-08 MED ORDER — SIMVASTATIN 20 MG PO TABS
20.0000 mg | ORAL_TABLET | Freq: Every day | ORAL | Status: DC
  Administered 2014-05-08 – 2014-05-10 (×3): 20 mg via ORAL
  Filled 2014-05-08 (×3): qty 1

## 2014-05-08 MED ORDER — GADOBENATE DIMEGLUMINE 529 MG/ML IV SOLN
15.0000 mL | Freq: Once | INTRAVENOUS | Status: AC
Start: 2014-05-08 — End: 2014-05-08
  Administered 2014-05-08: 14 mL via INTRAVENOUS

## 2014-05-08 MED ORDER — LORAZEPAM 0.5 MG PO TABS
0.5000 mg | ORAL_TABLET | Freq: Once | ORAL | Status: AC
  Administered 2014-05-08: 0.5 mg via ORAL
  Filled 2014-05-08: qty 1

## 2014-05-08 MED ORDER — TRAZODONE HCL 50 MG PO TABS: 50.0000 mg | ORAL_TABLET | Freq: Every evening | ORAL | Status: DC | PRN

## 2014-05-08 NOTE — Progress Notes (Addendum)
Pt arrived via stretcher by Lanora ManisElizabeth RN at 2308 with son at bedside. Pt alert and oriented to room and call bell. Bed alarm on. Pt stated that she is a DNR, confirmed with son and requested a copy to place in chart. Explained meaning of DNR with pt and son to clarify her wishes, they both agreed. Notified Doree AlbeeSteven Newton MD of pt status, order for pt to be DNR. Will continue to monitor.

## 2014-05-08 NOTE — Progress Notes (Signed)
PT Cancellation Note  Patient Details Name: [name] KinsMarjorie D [name] MRN: [id number] DOB: [birth year]   Cancelled Treatment:    Reason Eval/Treat Not Completed: Medical issues which prohibited therapy. Pt at MRI. Will re-attempt at next available time.    Donnamarie [place], [name] [place], [place]  [phone] 05/08/2014, 8:29 AM

## 2014-05-08 NOTE — Progress Notes (Signed)
CARE MANAGEMENT NOTE 05/08/2014  Patient:  [name],[name]   Account Number:  [id number]  Date Initiated:  05/08/2014  Documentation initiated by:  Jiles CrockerHANDLER,Tecia Cinnamon  Subjective/Objective Assessment:   ADMITTED WITH STROKE     Action/Plan:   [facility]   Anticipated DC Date:  05/08/2014   Anticipated DC Plan:  SKILLED NURSING FACILITY  In-house referral  Clinical Social Worker      DC Planning Services  CM consult         Status of service:  In process, will continue to follow Medicare Important Message given?   (If response is "NO", the following Medicare IM given date fields will be blank)  Per UR Regulation:  Reviewed for med. necessity/level of care/duration of stay  Comments:  7/23/2015Abelino Derrick- B Arnett Duddy RN,BSN,MHA [phone]

## 2014-05-08 NOTE — Progress Notes (Signed)
Pt. Requesting Dr. Despina HickAlusio be consulted regarding her hip; Dr. Arthor CaptainElmahi notified.

## 2014-05-08 NOTE — Evaluation (Signed)
Speech Language Pathology Evaluation Patient Details Name: Edrick KinsMarjorie D Rhames MRN: [id number] DOB: June 01, 1922 Today's Date: 05/08/2014 Time: [id number] SLP Time Calculation (min): 25 min  Problem List:  Patient Active Problem List   Diagnosis Date Noted  . CVA (cerebral infarction) 05/07/2014  . Thrush 08/10/2013  . Chronic low back pain 12/28/2012  . Dyslipidemia   . Hx of syncope   . EUSTACHIAN TUBE DYSFUNCTION 08/11/2007  . Nonallergic rhinitis 08/11/2007   Past Medical History:  Past Medical History  Diagnosis Date  . Deviated nasal septum   . Dysfunction of eustachian tube   . Unspecified sinusitis (chronic)   . Allergic rhinitis, cause unspecified   . Arthritis   . Cataract   . Osteoporosis   . Hx of syncope     had loop recorder for 2 years now explanted, only PACs noted on device for over 2 years, no a fib, Echo 08/18/09 EF >55%mild concentric LVHnormal myoview, normal carotid dopplers 2010  . Pruritus of skin     poss. related to Losartan  . Dyslipidemia   . Normal cardiac stress test     lexiscan myoview 04/08/08   Past Surgical History:  Past Surgical History  Procedure Laterality Date  . Tonsillectomy    . Total hip arthroplasty      bilateral  . Pelvic sling    . Joint replacement    . Loop recorder implant  07/2009    to eval syncope  . Loop recorder explant  01/12/2012   HPI:  80 year old female admitted 05/07/14 due to LLE numbness. PMH significant for back pain, hip dislocation. Cognitive Linguistic evaluation requested as part of stroke work up   Assessment / Plan / Recommendation Clinical Impression  Pt presents with mild cognitive deficits, with score of 24/30 acheived on MoCA Basic. No further acute ST needs identified. If cognitive function continues to deteriorate, recommend consideration of assisted living, especially given advanced age.    SLP Assessment  All further Speech Lanaguage Pathology  needs can be addressed in the next venue of care     Follow Up Recommendations  None    Frequency and Duration        Pertinent Vitals/Pain VSS, no pain indicated   SLP Goals   n/a  SLP Evaluation Prior Functioning  Cognitive/Linguistic Baseline: Within functional limits Type of Home: Independent living facility  Lives With: Alone Available Help at Discharge: Skilled Nursing Facility Education: 11th grade  Vocation: Retired   IT consultantCognition  Overall Cognitive Status: Impaired/Different from baseline Arousal/Alertness: Awake/alert Orientation Level: Oriented X4 Attention: Focused;Sustained;Selective Focused Attention: Appears intact Sustained Attention: Appears intact Selective Attention: Appears intact Memory: Impaired Memory Impairment: Storage deficit;Retrieval deficit;Decreased recall of new information Awareness: Appears intact Problem Solving: Appears intact Executive Function: Reasoning Reasoning: Appears intact Safety/Judgment: Appears intact Comments: Overall score on MoCA Basic was 24/30. This indicates mild cognitive impairment.     Comprehension  Auditory Comprehension Overall Auditory Comprehension: Appears within functional limits for tasks assessed Visual Recognition/Discrimination Discrimination: Not tested Reading Comprehension Reading Status: Not tested    Expression Expression Primary Mode of Expression: Verbal Verbal Expression Overall Verbal Expression: Appears within functional limits for tasks assessed Written Expression Dominant Hand: Right Written Expression: Not tested   Oral / Motor Oral Motor/Sensory Function Overall Oral Motor/Sensory Function: Appears within functional limits for tasks assessed Motor Speech Overall Motor Speech: Appears within functional limits for tasks assessed   GO    Aubra Pappalardo B. Murvin NatalBueche, [facility] PortageMSP, CCC-SLP [phone]  Jaylinn Hellenbrand,  Ruffin Pyo 05/08/2014, 10:20 AM

## 2014-05-08 NOTE — Consult Note (Signed)
Neurology Consultation Reason for Consult: Left-sided numbness Referring Physician:  Blas.  CC: Left-sided numbness  History is obtained from: Patient, son  HPI: ICESIS RENN is a 80 y.o. female with a history of numbness of the left side that started on Sunday and her leg and has progressed first involve her left arm and subsequently now her left face. She had her hip dislocated and had that repaired during in ER visit late in the evening 7/21 into early morning 7/22.  On 7/22, her son noticed that her numbness was involving her arm and he had only previously heard about her complaining of her leg. She went to see an orthopedist today because they were concerned her back may be contributing to her left leg numbness. He noticed difficulty with finger-nose-finger on the left and referred her to the emergency room where a CT scan shows hypodensity in the left occipital region.   LKW: Sunday 7/19 tpa given?: no, outside of window    ROS: A 14 point ROS was performed and is negative except as noted in the HPI.   Past Medical History  Diagnosis Date  . Deviated nasal septum   . Dysfunction of eustachian tube   . Unspecified sinusitis (chronic)   . Allergic rhinitis, cause unspecified   . Arthritis   . Cataract   . Osteoporosis   . Hx of syncope     had loop recorder for 2 years now explanted, only PACs noted on device for over 2 years, no a fib, Echo 08/18/09 EF >55%mild concentric LVHnormal myoview, normal carotid dopplers 2010  . Pruritus of skin     poss. related to Losartan  . Dyslipidemia   . Normal cardiac stress test     lexiscan myoview 04/08/08    Family History: Cancer-father  Social History: Tob: Denies  Exam: Current vital signs: BP 162/76  Pulse 60  Temp(Src) 98 F (36.7 C) (Oral)  Resp 18  Ht 5\' 4"  (1.626 m)  Wt 68.04 kg (150 lb)  BMI 25.73 kg/m2  SpO2 96% Vital signs in last 24 hours: Temp:  [97.5 F (36.4 C)-98.3 F (36.8 C)] 98 F (36.7  C) (07/23 0000) Pulse Rate:  [60-88] 60 (07/23 0000) Resp:  [16-23] 18 (07/23 0000) BP: (140-186)/(62-118) 162/76 mmHg (07/23 0000) SpO2:  [95 %-98 %] 96 % (07/23 0000) Weight:  [68.04 kg (150 lb)] 68.04 kg (150 lb) (07/22 2300)  General: In bed, NAD CV: Regular rate and rhythm Mental Status: Patient is awake, alert, oriented to person, place, month, year, and situation. Immediate and remote memory are intact. Patient is able to give a clear and coherent history. No signs of aphasia or neglect Cranial Nerves: II: Visual Fields are notable for left hemianopia. Pupils are equal, round, and reactive to light.  Discs are difficult to visualize. III,IV, VI: EOMI without ptosis or diploplia.  V: Facial sensation is symmetric to temperature VII: Facial movement is symmetric.  VIII: hearing is intact to voice X: Uvula elevates symmetrically XI: Shoulder shrug is symmetric. XII: tongue is midline without atrophy or fasciculations.  Motor: Tone is normal. Bulk is normal. 5/5 strength was present on the right, though the proximal left leg could not be tested due to brace and recent repair. The left arm does have mild drift though to confrontation it seems fairly symmetric with the right arm. Sensory: Sensation is symmetric to light touch and temperature in the arms, but decreased to pinprick in the left leg. Deep Tendon Reflexes: 2+  and symmetric in the biceps, unable to test left patella 2 to brace Cerebellar: FNF intact on right, has difficulty on left. Gait: Not assessed due to acute nature of evaluation and multiple medical monitors in ED setting.    I have reviewed labs in epic and the results pertinent to this consultation are: chem 8-mildly low sodium  I have reviewed the images obtained: CT head-hypodensity in the subcortical white matter on the right  Impression: 80 year old female with likely mass lesion in the right occipital region. Stroke is possible, but does not clearly  fit vascular distribution and the progressive nature over several days would be unusual.  Recommendations: 1)  MRI brain with/without contrast 2) stroke workup if MRI is positive. 3) further workup of mass or starting on steroids pending scan.   Ritta SlotMcNeill Shlonda Dolloff, MD [facility] [phone]  If 7pm- 7am, please page neurology on call as listed in AMION.

## 2014-05-08 NOTE — Progress Notes (Signed)
MRI report received from Radiology. MD paged with result. Will continue to monitor.  Sim BoastHavy, RN

## 2014-05-08 NOTE — Progress Notes (Signed)
Subjective: Continues to have decreased sensation on the left arm. No other complaints.   Objective: Current vital signs: BP 157/69  Pulse 64  Temp(Src) 97.9 F (36.6 C) (Oral)  Resp 18  Ht $RemoveBeforeD [id number]$5\' 4" SpO2 96% Vital signs in last 24 hours: Temp:  [97.5 F (36.4 C)-98.6 F (37 C)] 97.9 F (36.6 C) (07/23 1435) Pulse Rate:  [60-88] 64 (07/23 1435) Resp:  [16-23] 18 (07/23 1435) BP: (110-186)/(46-118) 157/69 mmHg (07/23 1435) SpO2:  [94 %-98 %] 96 % (07/23 1435) Weight:  [68.04 kg (150 lb)] 68.04 kg (150 lb) (07/22 2300)  Intake/Output from previous day:   Intake/Output this shift: Total I/O In: 600 [P.O.:600] Out: -  Nutritional status: Cardiac  Neurologic Exam: General: Mental Status: Alert, oriented, thought content appropriate.  Speech fluent without evidence of aphasia.  Able to follow 3 step commands without difficulty. Cranial Nerves: II:  left hemianopia, pupils equal, round, reactive to light and accommodation III,IV, VI: ptosis not present, extra-ocular motions intact bilaterally V,VII: smile symmetric, facial light touch sensation normal bilaterally VIII: hearing normal bilaterally IX,X: gag reflex present XI: bilateral shoulder shrug XII: midline tongue extension without atrophy or fasciculations  Motor: Right : Upper extremity   5/5    Left:     Upper extremity   5/5  Lower extremity   5/5     Lower extremity   5/5 Tone and bulk:normal tone throughout; no atrophy noted Sensory: decreased sensation on the left arm.  Deep Tendon Reflexes:  Right: Upper Extremity   Left: Upper extremity   biceps (C-5 to C-6) 2/4   biceps (C-5 to C-6) 2/4 tricep (C7) 2/4    triceps (C7) 2/4 Brachioradialis (C6) 2/4  Brachioradialis (C6) 2/4  Lower Extremity Lower Extremity  quadriceps (L-2 to L-4) 2/4   quadriceps (L-2 to L-4) 2/4 Achilles (S1) 1/4   Achilles (S1) 1/4  Plantars: Right: downgoing   Left:  downgoing Cerebellar: normal finger-to-nose,  normal heel-to-shin test Gait: not tested CV: pulses palpable throughout    Lab Results: Basic Metabolic Panel:  Recent Labs Lab 05/06/14 0033 05/07/14 1720 05/07/14 2046  NA 137 136* 134*  K 4.0 4.4 4.2  CL 100 97 102  CO2  --  23  --   GLUCOSE 104* 98 107*  BUN 14 13 14   CREATININE 0.80 0.65 0.60  CALCIUM  --  9.2  --     Liver Function Tests: No results found for this basename: AST, ALT, ALKPHOS, BILITOT, PROT, ALBUMIN,  in the last 168 hours No results found for this basename: LIPASE, AMYLASE,  in the last 168 hours No results found for this basename: AMMONIA,  in the last 168 hours  CBC:  Recent Labs Lab 05/06/14 0025 05/06/14 0033 05/07/14 1720 05/07/14 2046  WBC 5.5  --  5.4  --   HGB 13.3 13.9 14.4 14.6  HCT 39.0 41.0 43.4 43.0  MCV 87.1  --  90.2  --   PLT 224  --  256  --     Cardiac Enzymes: No results found for this basename: CKTOTAL, CKMB, CKMBINDEX, TROPONINI,  in the last 168 hours  Lipid Panel:  Recent Labs Lab 05/08/14 0540  CHOL 198  TRIG 86  HDL 55  CHOLHDL 3.6  VLDL 17  LDLCALC 253126*    CBG: No results found for this basename: GLUCAP,  in the last 168 hours  Microbiology: Results for  orders placed in visit on 11/12/13  URINE CULTURE     Status: None   Collection Time    11/12/13  1:56 PM      Result Value Ref Range Status   Culture CITROBACTER FREUNDII   Final   Colony Count >=100,000 COLONIES/ML   Final   Organism ID, Bacteria CITROBACTER FREUNDII   Final    Coagulation Studies: No results found for this basename: LABPROT, INR,  in the last 72 hours  Imaging: Ct Head Wo Contrast  05/07/2014   CLINICAL DATA:  Left-sided arm a numbness.  EXAM: CT HEAD WITHOUT CONTRAST  CT CERVICAL SPINE WITHOUT CONTRAST  TECHNIQUE: Multidetector CT imaging of the head and cervical spine was performed following the standard protocol without intravenous contrast. Multiplanar CT image  reconstructions of the cervical spine were also generated.  COMPARISON:  Head CT from 02/05/2008.  FINDINGS: CT HEAD FINDINGS  There is no evidence for acute hemorrhage, hydrocephalus, midline shift, or abnormal extra-axial fluid collection. Low attenuation in the medial right occipital lobe is new in the interval and raises concern for right occipital lobe acute to subacute infarct. Edema from an un seen underlying mass is considered less likely but not excluded.  Chronic lacunar infarct is seen in the anterior limb of the right internal capsule, as before. Diffuse loss of parenchymal volume is consistent with atrophy. Patchy low attenuation in the deep hemispheric and periventricular white matter is nonspecific, but likely reflects chronic microvascular ischemic demyelination.  The visualized paranasal sinuses and mastoid air cells are clear. Fluid and posterior mastoid air cells is unchanged in the long interval since the prior study.  CT CERVICAL SPINE FINDINGS  Imaging was obtained from the skullbase through the T2-3 interspace. No evidence for fracture. Three-4 mm anterolisthesis of C7 on T1 is likely related to the advanced facet arthropathy at the same level. There is degenerative disc and endplate disease at C4-5, C5-6, and C6-7. The facets are aligned bilaterally. Mild left-sided foraminal encroachment at C4-5 is secondary to uncinate spurring and facet overgrowth. Moderate right-sided foraminal stenosis at C5-6 is secondary to uncinate spurring and facet overgrowth. The Normal cervical lordosis is preserved. There is no prevertebral soft tissue swelling.  IMPRESSION: Probable acute to subacute right occipital infarct without evidence of hemorrhage. Wallace Cullens matter involvement is not well demonstrated raising the question that this could be related to vasogenic edema. MRI could be used to further evaluate given the possibility that this is related to edema and not ischemia, intravenous contrast material is  recommended if that study is performed.  Degenerative changes in the cervical spine without evidence of an acute fracture.  I discussed these findings by telephone with the nurse practitioner, Sabino Dick, at 2139 hrs on 05/07/2014.   Electronically Signed   By: Kennith Center M.D.   On: 05/07/2014 21:40   Ct Cervical Spine Wo Contrast  05/07/2014   CLINICAL DATA:  Left-sided arm a numbness.  EXAM: CT HEAD WITHOUT CONTRAST  CT CERVICAL SPINE WITHOUT CONTRAST  TECHNIQUE: Multidetector CT imaging of the head and cervical spine was performed following the standard protocol without intravenous contrast. Multiplanar CT image reconstructions of the cervical spine were also generated.  COMPARISON:  Head CT from 02/05/2008.  FINDINGS: CT HEAD FINDINGS  There is no evidence for acute hemorrhage, hydrocephalus, midline shift, or abnormal extra-axial fluid collection. Low attenuation in the medial right occipital lobe is new in the interval and raises concern for right occipital lobe acute to subacute infarct.  Edema from an un seen underlying mass is considered less likely but not excluded.  Chronic lacunar infarct is seen in the anterior limb of the right internal capsule, as before. Diffuse loss of parenchymal volume is consistent with atrophy. Patchy low attenuation in the deep hemispheric and periventricular white matter is nonspecific, but likely reflects chronic microvascular ischemic demyelination.  The visualized paranasal sinuses and mastoid air cells are clear. Fluid and posterior mastoid air cells is unchanged in the long interval since the prior study.  CT CERVICAL SPINE FINDINGS  Imaging was obtained from the skullbase through the T2-3 interspace. No evidence for fracture. Three-4 mm anterolisthesis of C7 on T1 is likely related to the advanced facet arthropathy at the same level. There is degenerative disc and endplate disease at C4-5, C5-6, and C6-7. The facets are aligned bilaterally. Mild left-sided foraminal  encroachment at C4-5 is secondary to uncinate spurring and facet overgrowth. Moderate right-sided foraminal stenosis at C5-6 is secondary to uncinate spurring and facet overgrowth. The Normal cervical lordosis is preserved. There is no prevertebral soft tissue swelling.  IMPRESSION: Probable acute to subacute right occipital infarct without evidence of hemorrhage. Wallace Cullens matter involvement is not well demonstrated raising the question that this could be related to vasogenic edema. MRI could be used to further evaluate given the possibility that this is related to edema and not ischemia, intravenous contrast material is recommended if that study is performed.  Degenerative changes in the cervical spine without evidence of an acute fracture.  I discussed these findings by telephone with the nurse practitioner, Sabino Dick, at 2139 hrs on 05/07/2014.   Electronically Signed   By: Kennith Center M.D.   On: 05/07/2014 21:40   Mr [name] ZO Contrast  05/08/2014   CLINICAL DATA:  Left leg numbness progressing to involve the left arm and left face. Probable acute right occipital infarct seen on CT.  EXAM: MRI HEAD WITHOUT CONTRAST  MRA HEAD WITHOUT CONTRAST  TECHNIQUE: Multiplanar, multiecho pulse sequences of the brain and surrounding structures were obtained without intravenous contrast. Angiographic images of the head were obtained using MRA technique without contrast.  COMPARISON:  Head CT 05/07/2014  FINDINGS: MRI HEAD FINDINGS  Patchy and confluent restricted diffusion is present in the posterior right temporal lobe, medial right occipital lobe, and lateral aspect of the right thalamus, consistent with acute PCA territory infarct. There is associated mild edema without significant mass effect. There is no evidence of intracranial hemorrhage.  Foci of T2 hyperintensity in the subcortical and deep cerebral white matter elsewhere are nonspecific but compatible with mild chronic small vessel ischemic disease. There is mild  to moderate cerebral atrophy. There is no abnormal enhancement. There is no mass, midline shift, or extra-axial fluid collection.  Prior bilateral cataract extraction is noted. Tiny left maxillary sinus mucous retention cyst is noted. There are small bilateral mastoid effusions. Right PCA flow void is not clearly seen and it may be occluded. Other major intracranial arterial flow voids appear preserved.  MRA HEAD FINDINGS  The visualized distal vertebral arteries are patent and codominant. There is approximately 50% stenosis of the distal left vertebral artery. There is a severe, focal stenosis of the proximal basilar artery. Remainder of the basilar artery is patent without additional stenosis. SCA origins are patent, with question of moderate stenosis on the left. There are fetal type origins of the PCAs bilaterally. There is occlusion of the proximal P2 segment on the right. There is mild left PCA irregularity with  a mild to moderate focal stenosis of the proximal P2 segment.  Internal carotid arteries are patent from skullbase to carotid termini. There is mild MCA branch vessel irregularity bilaterally, and there is mild stenosis of the left MCA at the bifurcation. Proximal right MCA is unremarkable. Right ACA is unremarkable. There is a severe focal stenosis of the left A2 segment. No intracranial aneurysm is identified.  IMPRESSION: 1. Acute, moderately large right PCA territory infarct. No hemorrhage. 2. Fetal type origins of the PCAs with occlusion of the proximal P2 segment on the right. 3. Intracranial atherosclerosis with severe stenoses of the proximal basilar artery and left P2 segment and mild to moderate stenoses of the distal left vertebral artery, left P2 segment, and left MCA bifurcation. These results will be called to the ordering clinician or representative by the Radiologist Assistant, and communication documented in the PACS or zVision Dashboard.   Electronically Signed   By: Sebastian Ache    On: 05/08/2014 09:18   Mr [name] Head/brain Wo Cm  05/08/2014   CLINICAL DATA:  Left leg numbness progressing to involve the left arm and left face. Probable acute right occipital infarct seen on CT.  EXAM: MRI HEAD WITHOUT CONTRAST  MRA HEAD WITHOUT CONTRAST  TECHNIQUE: Multiplanar, multiecho pulse sequences of the brain and surrounding structures were obtained without intravenous contrast. Angiographic images of the head were obtained using MRA technique without contrast.  COMPARISON:  Head CT 05/07/2014  FINDINGS: MRI HEAD FINDINGS  Patchy and confluent restricted diffusion is present in the posterior right temporal lobe, medial right occipital lobe, and lateral aspect of the right thalamus, consistent with acute PCA territory infarct. There is associated mild edema without significant mass effect. There is no evidence of intracranial hemorrhage.  Foci of T2 hyperintensity in the subcortical and deep cerebral white matter elsewhere are nonspecific but compatible with mild chronic small vessel ischemic disease. There is mild to moderate cerebral atrophy. There is no abnormal enhancement. There is no mass, midline shift, or extra-axial fluid collection.  Prior bilateral cataract extraction is noted. Tiny left maxillary sinus mucous retention cyst is noted. There are small bilateral mastoid effusions. Right PCA flow void is not clearly seen and it may be occluded. Other major intracranial arterial flow voids appear preserved.  MRA HEAD FINDINGS  The visualized distal vertebral arteries are patent and codominant. There is approximately 50% stenosis of the distal left vertebral artery. There is a severe, focal stenosis of the proximal basilar artery. Remainder of the basilar artery is patent without additional stenosis. SCA origins are patent, with question of moderate stenosis on the left. There are fetal type origins of the PCAs bilaterally. There is occlusion of the proximal P2 segment on the right. There is mild  left PCA irregularity with a mild to moderate focal stenosis of the proximal P2 segment.  Internal carotid arteries are patent from skullbase to carotid termini. There is mild MCA branch vessel irregularity bilaterally, and there is mild stenosis of the left MCA at the bifurcation. Proximal right MCA is unremarkable. Right ACA is unremarkable. There is a severe focal stenosis of the left A2 segment. No intracranial aneurysm is identified.  IMPRESSION: 1. Acute, moderately large right PCA territory infarct. No hemorrhage. 2. Fetal type origins of the PCAs with occlusion of the proximal P2 segment on the right. 3. Intracranial atherosclerosis with severe stenoses of the proximal basilar artery and left P2 segment and mild to moderate stenoses of the distal left vertebral artery, left  P2 segment, and left MCA bifurcation. These results will be called to the ordering clinician or representative by the Radiologist Assistant, and communication documented in the PACS or zVision Dashboard.   Electronically Signed   By: Sebastian Ache   On: 05/08/2014 09:18    Study Conclusions 2 D echo  - Left ventricle: The cavity size was normal. Wall thickness was increased in a pattern of moderate LVH. Systolic function was normal. The estimated ejection fraction was in the range of 60% to 65%. - Aortic valve: There was mild to moderate stenosis. There was mild regurgitation. Valve area (VTI): 0.8 cm^2. Valve area (Vmax): 0.76 cm^2. - Mitral valve: There was mild regurgitation. - Atrial septum: No defect or patent foramen ovale was identified.        Medications:  Prior to Admission:  Prescriptions prior to admission  Medication Sig Dispense Refill  . cetirizine (ZYRTEC) 10 MG tablet Take 10 mg by mouth daily.      Marland Kitchen estradiol (ESTRING) 2 MG vaginal ring Place 2 mg vaginally every 3 (three) months. follow package directions      . HYDROcodone-acetaminophen (NORCO/VICODIN) 5-325 MG per tablet Take 1 tablet by  mouth every 6 (six) hours as needed for moderate pain.      Marland Kitchen levothyroxine (SYNTHROID, LEVOTHROID) 75 MCG tablet Take 75 mcg by mouth daily.        Marland Kitchen LORazepam (ATIVAN) 1 MG tablet Take 0.5 mg by mouth 2 (two) times daily.       . meclizine (ANTIVERT) 25 MG tablet Take 25 mg by mouth 3 (three) times daily as needed for dizziness.      . montelukast (SINGULAIR) 10 MG tablet Take 10 mg by mouth at bedtime.       Scheduled: .  stroke: mapping our early stages of recovery book   Does not apply Once  . aspirin EC  325 mg Oral Daily  . enoxaparin (LOVENOX) injection  30 mg Subcutaneous Q24H  . levothyroxine  75 mcg Oral QAC breakfast  . loratadine  10 mg Oral Daily  . LORazepam  0.5 mg Oral BID  . montelukast  10 mg Oral QHS  . simvastatin  20 mg Oral q1800   A1c 5.8 LDL 126  Assessment/Plan: Acute right PCA infarct.   Recommend: 1) Carotid Doppler 2) Continue ASA 3) PT/OT 4) Stroke team to follow in AM.      Felicie Morn PA-C [facility] [phone]  05/08/2014, 3:19 PM

## 2014-05-08 NOTE — ED Provider Notes (Signed)
Medical screening examination/treatment/procedure(s) were performed by non-physician practitioner and as supervising physician I was immediately available for consultation/collaboration.   EKG Interpretation None       Avraj Lindroth, MD 05/08/14 0210 

## 2014-05-08 NOTE — Progress Notes (Signed)
OT Discharge Note  Patient Details Name: Edrick KinsMarjorie D Tyer MRN: [id number] DOB: October 28, 1921   Cancelled Treatment:    Reason Eval/Treat Not Completed: Other (comment) (defer back to SNF) OT to defer treatment back to SNF [facility]  Harolyn RutherfordJones, Alvin Rubano B Pager: [phone]  05/08/2014, 2:57 PM

## 2014-05-08 NOTE — Clinical Social Work Psychosocial (Signed)
Clinical Social Work Department BRIEF PSYCHOSOCIAL ASSESSMENT 05/08/2014  Patient:  [name], [name]     Account Number:  [id number]     Admit date:  05/07/2014  Clinical Social Worker:  Delrae Sawyers  Date/Time:  05/08/2014 02:13 PM  Referred by:  Physician  Date Referred:  05/08/2014 Referred for  SNF Placement   Other Referral:   none.   Interview type:  Patient Other interview type:   none.    PSYCHOSOCIAL DATA Living Status:  FACILITY Admitted from facility:  [facility] Level of care:  Independent Living Primary support name:  [name] Primary support relationship to patient:  CHILD, ADULT Degree of support available:   Adequate support system. Pt states her son lives in [place].    CURRENT CONCERNS Current Concerns  Post-Acute Placement   Other Concerns:   none.    SOCIAL WORK ASSESSMENT / PLAN CSW met with pt at bedside to discuss discharge disposition. Pt informed CSW she currently lives alone in [place] living at [facility]. Pt states she is agreeable to returning to [facility] at which ever level of care is recommended for her.    CSW contacted admissions for [facility] and will keep the facility up to date regarding discharge disposition.   Assessment/plan status:  Psychosocial Support/Ongoing Assessment of Needs Other assessment/ plan:   none.   Information/referral to community resources:   Pt returning to [facility].    PATIENT'S/FAMILY'S RESPONSE TO PLAN OF CARE: Pt is understanding and agreeable to CSW plan of care. Pt expressed no further questions or concerns at this time.       Lubertha Sayres, MSW, [facility] Licensed Clinical Social Worker [phone] and [phone]

## 2014-05-08 NOTE — Progress Notes (Signed)
Echocardiogram completed.

## 2014-05-08 NOTE — Progress Notes (Signed)
[facility] PROGRESS NOTE   [name] JYN:[id number] DOB: [birth year] DOA: 05/07/2014 PCP: Pearson Grippe, MD  HPI/Subjective: Has some left upper extremity numbness.  Assessment/Plan: Principal Problem:   CVA (cerebral infarction) Active Problems:   Dyslipidemia   Chronic low back pain   Dislocation of hip prosthesis    Acute CVA -Patient presented to the hospital with left upper extremity numbness and minimal weakness. -CT/MRI of the brain showed a right PCA, P2 segment stroke. -Severe stenosis of the proximal basilar artery, left P2 segment and moderate stenosis of left vertebral artery and left MCA. -Start patient on aspirin, await neurology recommendations. -Discontinue estrogen vaginal ring. -PT/OT, check hemoglobin A1c, check fasting lipid profile, start statin.  Dyslipidemia -Not understanding, will start simvastatin, check fasting lipid profile. -Discontinue estrogen vaginal ring.  Chronic low back pain -Continue Vicodin as needed.  Dislocation of hip prosthesis -Recent dislocation of hip prosthesis on 05/06/2014, reduced under conscious sedation. -Patient requested Dr. Albin Felling you to be aware, I will notify his office.   Code Status: Full code Family Communication: Plan discussed with the patient. Disposition Plan: Remains inpatient   Consultants:  Neurology  Procedures:  None  Antibiotics:  None   Objective: Filed Vitals:   05/08/14 1126  BP: 146/59  Pulse: 73  Temp: 98.6 F (37 C)  Resp: 16    Intake/Output Summary (Last 24 hours) at 05/08/14 1256 Last data filed at 05/08/14 1149  Gross per 24 hour  Intake    360 ml  Output      0 ml  Net    360 ml   Filed Weights   05/07/14 1853 05/07/14 2300  Weight: 68.04 kg (150 lb) 68.04 kg (150 lb)    Exam: General: Alert and awake, oriented x3, not in any acute distress. HEENT: anicteric sclera, pupils reactive to light and accommodation, EOMI CVS: S1-S2 clear, no murmur rubs  or gallops Chest: clear to auscultation bilaterally, no wheezing, rales or rhonchi Abdomen: soft nontender, nondistended, normal bowel sounds, no organomegaly Extremities: no cyanosis, clubbing or edema noted bilaterally Neuro: Cranial nerves II-XII intact, no focal neurological deficits  Data Reviewed: Basic Metabolic Panel:  Recent Labs Lab 05/06/14 0033 05/07/14 1720 05/07/14 2046  NA 137 136* 134*  K 4.0 4.4 4.2  CL 100 97 102  CO2  --  23  --   GLUCOSE 104* 98 107*  BUN 14 13 14   CREATININE 0.80 0.65 0.60  CALCIUM  --  9.2  --    Liver Function Tests: No results found for this basename: AST, ALT, ALKPHOS, BILITOT, PROT, ALBUMIN,  in the last 168 hours No results found for this basename: LIPASE, AMYLASE,  in the last 168 hours No results found for this basename: AMMONIA,  in the last 168 hours CBC:  Recent Labs Lab 05/06/14 0025 05/06/14 0033 05/07/14 1720 05/07/14 2046  WBC 5.5  --  5.4  --   HGB 13.3 13.9 14.4 14.6  HCT 39.0 41.0 43.4 43.0  MCV 87.1  --  90.2  --   PLT 224  --  256  --    Cardiac Enzymes: No results found for this basename: CKTOTAL, CKMB, CKMBINDEX, TROPONINI,  in the last 168 hours BNP (last 3 results) No results found for this basename: PROBNP,  in the last 8760 hours CBG: No results found for this basename: GLUCAP,  in the last 168 hours  Micro No results found for this or any previous visit (from the past 240 hour(s)).  Studies: Ct Head Wo Contrast  05/07/2014   CLINICAL DATA:  Left-sided arm a numbness.  EXAM: CT HEAD WITHOUT CONTRAST  CT CERVICAL SPINE WITHOUT CONTRAST  TECHNIQUE: Multidetector CT imaging of the head and cervical spine was performed following the standard protocol without intravenous contrast. Multiplanar CT image reconstructions of the cervical spine were also generated.  COMPARISON:  Head CT from 02/05/2008.  FINDINGS: CT HEAD FINDINGS  There is no evidence for acute hemorrhage, hydrocephalus, midline shift, or  abnormal extra-axial fluid collection. Low attenuation in the medial right occipital lobe is new in the interval and raises concern for right occipital lobe acute to subacute infarct. Edema from an un seen underlying mass is considered less likely but not excluded.  Chronic lacunar infarct is seen in the anterior limb of the right internal capsule, as before. Diffuse loss of parenchymal volume is consistent with atrophy. Patchy low attenuation in the deep hemispheric and periventricular white matter is nonspecific, but likely reflects chronic microvascular ischemic demyelination.  The visualized paranasal sinuses and mastoid air cells are clear. Fluid and posterior mastoid air cells is unchanged in the long interval since the prior study.  CT CERVICAL SPINE FINDINGS  Imaging was obtained from the skullbase through the T2-3 interspace. No evidence for fracture. Three-4 mm anterolisthesis of C7 on T1 is likely related to the advanced facet arthropathy at the same level. There is degenerative disc and endplate disease at C4-5, C5-6, and C6-7. The facets are aligned bilaterally. Mild left-sided foraminal encroachment at C4-5 is secondary to uncinate spurring and facet overgrowth. Moderate right-sided foraminal stenosis at C5-6 is secondary to uncinate spurring and facet overgrowth. The Normal cervical lordosis is preserved. There is no prevertebral soft tissue swelling.  IMPRESSION: Probable acute to subacute right occipital infarct without evidence of hemorrhage. Wallace Cullens matter involvement is not well demonstrated raising the question that this could be related to vasogenic edema. MRI could be used to further evaluate given the possibility that this is related to edema and not ischemia, intravenous contrast material is recommended if that study is performed.  Degenerative changes in the cervical spine without evidence of an acute fracture.  I discussed these findings by telephone with the nurse practitioner, Sabino Dick,  at 2139 hrs on 05/07/2014.   Electronically Signed   By: Kennith Center M.D.   On: 05/07/2014 21:40   Ct Cervical Spine Wo Contrast  05/07/2014   CLINICAL DATA:  Left-sided arm a numbness.  EXAM: CT HEAD WITHOUT CONTRAST  CT CERVICAL SPINE WITHOUT CONTRAST  TECHNIQUE: Multidetector CT imaging of the head and cervical spine was performed following the standard protocol without intravenous contrast. Multiplanar CT image reconstructions of the cervical spine were also generated.  COMPARISON:  Head CT from 02/05/2008.  FINDINGS: CT HEAD FINDINGS  There is no evidence for acute hemorrhage, hydrocephalus, midline shift, or abnormal extra-axial fluid collection. Low attenuation in the medial right occipital lobe is new in the interval and raises concern for right occipital lobe acute to subacute infarct. Edema from an un seen underlying mass is considered less likely but not excluded.  Chronic lacunar infarct is seen in the anterior limb of the right internal capsule, as before. Diffuse loss of parenchymal volume is consistent with atrophy. Patchy low attenuation in the deep hemispheric and periventricular white matter is nonspecific, but likely reflects chronic microvascular ischemic demyelination.  The visualized paranasal sinuses and mastoid air cells are clear. Fluid and posterior mastoid air cells is unchanged in the long interval  since the prior study.  CT CERVICAL SPINE FINDINGS  Imaging was obtained from the skullbase through the T2-3 interspace. No evidence for fracture. Three-4 mm anterolisthesis of C7 on T1 is likely related to the advanced facet arthropathy at the same level. There is degenerative disc and endplate disease at C4-5, C5-6, and C6-7. The facets are aligned bilaterally. Mild left-sided foraminal encroachment at C4-5 is secondary to uncinate spurring and facet overgrowth. Moderate right-sided foraminal stenosis at C5-6 is secondary to uncinate spurring and facet overgrowth. The Normal cervical  lordosis is preserved. There is no prevertebral soft tissue swelling.  IMPRESSION: Probable acute to subacute right occipital infarct without evidence of hemorrhage. Wallace Cullens matter involvement is not well demonstrated raising the question that this could be related to vasogenic edema. MRI could be used to further evaluate given the possibility that this is related to edema and not ischemia, intravenous contrast material is recommended if that study is performed.  Degenerative changes in the cervical spine without evidence of an acute fracture.  I discussed these findings by telephone with the nurse practitioner, Sabino Dick, at 2139 hrs on 05/07/2014.   Electronically Signed   By: Kennith Center M.D.   On: 05/07/2014 21:40   Mr [name] ZO Contrast  05/08/2014   CLINICAL DATA:  Left leg numbness progressing to involve the left arm and left face. Probable acute right occipital infarct seen on CT.  EXAM: MRI HEAD WITHOUT CONTRAST  MRA HEAD WITHOUT CONTRAST  TECHNIQUE: Multiplanar, multiecho pulse sequences of the brain and surrounding structures were obtained without intravenous contrast. Angiographic images of the head were obtained using MRA technique without contrast.  COMPARISON:  Head CT 05/07/2014  FINDINGS: MRI HEAD FINDINGS  Patchy and confluent restricted diffusion is present in the posterior right temporal lobe, medial right occipital lobe, and lateral aspect of the right thalamus, consistent with acute PCA territory infarct. There is associated mild edema without significant mass effect. There is no evidence of intracranial hemorrhage.  Foci of T2 hyperintensity in the subcortical and deep cerebral white matter elsewhere are nonspecific but compatible with mild chronic small vessel ischemic disease. There is mild to moderate cerebral atrophy. There is no abnormal enhancement. There is no mass, midline shift, or extra-axial fluid collection.  Prior bilateral cataract extraction is noted. Tiny left maxillary  sinus mucous retention cyst is noted. There are small bilateral mastoid effusions. Right PCA flow void is not clearly seen and it may be occluded. Other major intracranial arterial flow voids appear preserved.  MRA HEAD FINDINGS  The visualized distal vertebral arteries are patent and codominant. There is approximately 50% stenosis of the distal left vertebral artery. There is a severe, focal stenosis of the proximal basilar artery. Remainder of the basilar artery is patent without additional stenosis. SCA origins are patent, with question of moderate stenosis on the left. There are fetal type origins of the PCAs bilaterally. There is occlusion of the proximal P2 segment on the right. There is mild left PCA irregularity with a mild to moderate focal stenosis of the proximal P2 segment.  Internal carotid arteries are patent from skullbase to carotid termini. There is mild MCA branch vessel irregularity bilaterally, and there is mild stenosis of the left MCA at the bifurcation. Proximal right MCA is unremarkable. Right ACA is unremarkable. There is a severe focal stenosis of the left A2 segment. No intracranial aneurysm is identified.  IMPRESSION: 1. Acute, moderately large right PCA territory infarct. No hemorrhage. 2. Fetal type origins  of the PCAs with occlusion of the proximal P2 segment on the right. 3. Intracranial atherosclerosis with severe stenoses of the proximal basilar artery and left P2 segment and mild to moderate stenoses of the distal left vertebral artery, left P2 segment, and left MCA bifurcation. These results will be called to the ordering clinician or representative by the Radiologist Assistant, and communication documented in the PACS or zVision Dashboard.   Electronically Signed   By: Sebastian AcheAllen  Grady   On: 05/08/2014 09:18   Mr Maxine GlennMra Head/brain Wo Cm  05/08/2014   CLINICAL DATA:  Left leg numbness progressing to involve the left arm and left face. Probable acute right occipital infarct seen on CT.   EXAM: MRI HEAD WITHOUT CONTRAST  MRA HEAD WITHOUT CONTRAST  TECHNIQUE: Multiplanar, multiecho pulse sequences of the brain and surrounding structures were obtained without intravenous contrast. Angiographic images of the head were obtained using MRA technique without contrast.  COMPARISON:  Head CT 05/07/2014  FINDINGS: MRI HEAD FINDINGS  Patchy and confluent restricted diffusion is present in the posterior right temporal lobe, medial right occipital lobe, and lateral aspect of the right thalamus, consistent with acute PCA territory infarct. There is associated mild edema without significant mass effect. There is no evidence of intracranial hemorrhage.  Foci of T2 hyperintensity in the subcortical and deep cerebral white matter elsewhere are nonspecific but compatible with mild chronic small vessel ischemic disease. There is mild to moderate cerebral atrophy. There is no abnormal enhancement. There is no mass, midline shift, or extra-axial fluid collection.  Prior bilateral cataract extraction is noted. Tiny left maxillary sinus mucous retention cyst is noted. There are small bilateral mastoid effusions. Right PCA flow void is not clearly seen and it may be occluded. Other major intracranial arterial flow voids appear preserved.  MRA HEAD FINDINGS  The visualized distal vertebral arteries are patent and codominant. There is approximately 50% stenosis of the distal left vertebral artery. There is a severe, focal stenosis of the proximal basilar artery. Remainder of the basilar artery is patent without additional stenosis. SCA origins are patent, with question of moderate stenosis on the left. There are fetal type origins of the PCAs bilaterally. There is occlusion of the proximal P2 segment on the right. There is mild left PCA irregularity with a mild to moderate focal stenosis of the proximal P2 segment.  Internal carotid arteries are patent from skullbase to carotid termini. There is mild MCA branch vessel  irregularity bilaterally, and there is mild stenosis of the left MCA at the bifurcation. Proximal right MCA is unremarkable. Right ACA is unremarkable. There is a severe focal stenosis of the left A2 segment. No intracranial aneurysm is identified.  IMPRESSION: 1. Acute, moderately large right PCA territory infarct. No hemorrhage. 2. Fetal type origins of the PCAs with occlusion of the proximal P2 segment on the right. 3. Intracranial atherosclerosis with severe stenoses of the proximal basilar artery and left P2 segment and mild to moderate stenoses of the distal left vertebral artery, left P2 segment, and left MCA bifurcation. These results will be called to the ordering clinician or representative by the Radiologist Assistant, and communication documented in the PACS or zVision Dashboard.   Electronically Signed   By: Sebastian AcheAllen  Grady   On: 05/08/2014 09:18    Scheduled Meds: .  stroke: mapping our early stages of recovery book   Does not apply Once  . aspirin EC  325 mg Oral Daily  . enoxaparin (LOVENOX) injection  30 mg  Subcutaneous Q24H  . [START ON 06/15/2014] estradiol  2 mg Vaginal Q90 days  . levothyroxine  75 mcg Oral QAC breakfast  . loratadine  10 mg Oral Daily  . LORazepam  0.5 mg Oral BID  . montelukast  10 mg Oral QHS   Continuous Infusions:      Time spent: 35 minutes    [facility] A  [facility] Pager [phone] If 7PM-7AM, please contact night-coverage at [email or web address], password [facility] 05/08/2014, 12:56 PM  LOS: 1 day

## 2014-05-08 NOTE — Evaluation (Signed)
Physical Therapy Evaluation Patient Details Name: [name] MRN: [id number] DOB: [birth year] Today's Date: 05/08/2014   History of Present Illness  80 y.o. year old female with significant past medical history of chronic low back pain, chronic hip s/p bilateral hip arthroplasty with recent hip dislocation s/p reduction under anesthesia presenting with CVA. Pt presenting to ED for Lt sided weakness; MRI revealed acute rt PCA infarct. pt also had Lt hip dislocation and reduction in ED 7/21.   Clinical Impression  Pt adm from independent living center due to above. Presents with decr independence with functional mobility secondary to deficits indicated below. Pt to benefit from skilled acute PT to address deficits indicated below and maximize functional mobility prior to D/C. At this time will recommend SNF for post acute rehab prior to returning to independent living facility. Awaiting ortho consult to determine appropriateness of Lt Knee immobilizer. Pt reported MD in ED ordered it until cleared by ortho.   Follow Up Recommendations SNF;Supervision/Assistance - 24 hour    Equipment Recommendations  None recommended by PT    Recommendations for Other Services OT consult     Precautions / Restrictions Precautions Precautions: Fall Required Braces or Orthoses: Knee Immobilizer - Left Knee Immobilizer - Left: Other (comment);On at all times (awaiting MD order regarding KI; ED MD said "all the time" ) Restrictions Weight Bearing Restrictions: Yes LLE Weight Bearing: Weight bearing as tolerated Other Position/Activity Restrictions: spoke with attending, Elmahi, approved WBAT; will follow up with ortho when consulted      Mobility  Bed Mobility Overal bed mobility: Modified Independent             General bed mobility comments: bed flattend and use of handrails to lift trunk to sitting position   Transfers Overall transfer level: Needs assistance Equipment used: Rolling  walker (2 wheeled) Transfers: Sit to/from Stand Sit to Stand: Min guard         General transfer comment: min guard to steady and for safety with standing; cues for hand placement and sequencing with RW   Ambulation/Gait Ambulation/Gait assistance: Min guard;Min assist Ambulation Distance (Feet): 50 Feet Assistive device: Rolling walker (2 wheeled) Gait Pattern/deviations: Decreased stance time - left;Decreased step length - right;Narrow base of support;Antalgic Gait velocity: decreased Gait velocity interpretation: Below normal speed for age/gender General Gait Details: pt unsteady with gt and required (A) to manage RW and maintain balance around obstacles; noted difficulty with objects on Lt sided due to possible Lt visual deficits; cues for safety and upright posture   Stairs            Wheelchair Mobility    Modified Rankin (Stroke Patients Only) Modified Rankin (Stroke Patients Only) Pre-Morbid Rankin Score: Slight disability Modified Rankin: Moderately severe disability     Balance Overall balance assessment: Needs assistance Sitting-balance support: Feet supported;No upper extremity supported Sitting balance-Leahy Scale: Fair Sitting balance - Comments: no c/o dizziness; sat EOB ~5 min   Standing balance support: During functional activity;Bilateral upper extremity supported Standing balance-Leahy Scale: Poor Standing balance comment: relies on RW for UE support             High level balance activites: Direction changes;Head turns High Level Balance Comments: unsteady; especially navigating around obstacles on Lt             Pertinent Vitals/Pain No c/o pain.     Home Living Family/patient expects to be discharged to:: Skilled nursing facility   Available Help at Discharge: Skilled Nursing Facility Type  of Home: Independent living facility                Prior Function Level of Independence: Independent with assistive device(s)          Comments: pt was independent at independent living facility with rollator      Hand Dominance   Dominant Hand: Right    Extremity/Trunk Assessment   Upper Extremity Assessment: Defer to OT evaluation           Lower Extremity Assessment: LLE deficits/detail   LLE Deficits / Details: strength WFL  Cervical / Trunk Assessment: Normal  Communication   Communication: No difficulties  Cognition Arousal/Alertness: Awake/alert Behavior During Therapy: WFL for tasks assessed/performed Overall Cognitive Status: Within Functional Limits for tasks assessed                      General Comments General comments (skin integrity, edema, etc.): appears to be Lt visual field cut; will need further assessment     Exercises        Assessment/Plan    PT Assessment Patient needs continued PT services  PT Diagnosis Abnormality of gait;Generalized weakness   PT Problem List Decreased strength;Decreased activity tolerance;Decreased balance;Decreased mobility;Decreased knowledge of use of DME;Impaired sensation  PT Treatment Interventions DME instruction;Gait training;Therapeutic activities;Functional mobility training;Therapeutic exercise;Balance training;Neuromuscular re-education;Patient/family education   PT Goals (Current goals can be found in the Care Plan section) Acute Rehab PT Goals Patient Stated Goal: to get back to my apartment  PT Goal Formulation: With patient Time For Goal Achievement: 05/12/14 Potential to Achieve Goals: Good    Frequency Min 3X/week   Barriers to discharge Decreased caregiver support from independent living     Co-evaluation               End of Session Equipment Utilized During Treatment: Gait belt;Left knee immobilizer Activity Tolerance: Patient tolerated treatment well Patient left: in bed;with call bell/phone within reach;with bed alarm set;with family/visitor present Nurse Communication: Mobility status         Time:  1539-1600 PT Time Calculation (min): 21 min   Charges:   PT Evaluation $Initial PT Evaluation Tier I: 1 Procedure PT Treatments $Gait Training: 8-22 mins   PT G CodesDonell Sievert, [place]  [phone] 05/08/2014, 4:51 PM

## 2014-05-08 NOTE — Progress Notes (Signed)
VASCULAR LAB PRELIMINARY  PRELIMINARY  PRELIMINARY  PRELIMINARY  Carotid Dopplers completed.    Preliminary report:  1-39% ICA stenosis.  Vertebral artery flow is antegrade.  Bexleigh Theriault, RVT 05/08/2014, 2:09 PM

## 2014-05-09 DIAGNOSIS — Z9189 Other specified personal risk factors, not elsewhere classified: Secondary | ICD-10-CM

## 2014-05-09 LAB — CBC
HCT: 37.6 % (ref 36.0–46.0)
Hemoglobin: 12.4 g/dL (ref 12.0–15.0)
MCH: 29.2 pg (ref 26.0–34.0)
MCHC: 33 g/dL (ref 30.0–36.0)
MCV: 88.5 fL (ref 78.0–100.0)
PLATELETS: 220 10*3/uL (ref 150–400)
RBC: 4.25 MIL/uL (ref 3.87–5.11)
RDW: 13.1 % (ref 11.5–15.5)
WBC: 5.1 10*3/uL (ref 4.0–10.5)

## 2014-05-09 LAB — BASIC METABOLIC PANEL
Anion gap: 13 (ref 5–15)
BUN: 12 mg/dL (ref 6–23)
CHLORIDE: 102 meq/L (ref 96–112)
CO2: 23 mEq/L (ref 19–32)
Calcium: 8.6 mg/dL (ref 8.4–10.5)
Creatinine, Ser: 0.68 mg/dL (ref 0.50–1.10)
GFR, EST AFRICAN AMERICAN: 87 mL/min — AB (ref >90)
GFR, EST NON AFRICAN AMERICAN: 75 mL/min — AB (ref >90)
GLUCOSE: 100 mg/dL — AB (ref 70–99)
Potassium: 4 mEq/L (ref 3.7–5.3)
SODIUM: 138 meq/L (ref 137–147)

## 2014-05-09 MED ORDER — CLOPIDOGREL BISULFATE 75 MG PO TABS
75.0000 mg | ORAL_TABLET | Freq: Every day | ORAL | Status: DC
  Administered 2014-05-09 – 2014-05-10 (×2): 75 mg via ORAL
  Filled 2014-05-09 (×2): qty 1

## 2014-05-09 NOTE — Progress Notes (Signed)
Stroke Team Progress Note  HISTORY [name] is a 80 y.o. female with a history of numbness of the left side that started on Sunday in her leg and has progressed to involve her left arm and subsequently her left face. She had her hip dislocated and had that repaired during an ER visit late in the evening 7/21 into early morning 7/22.  On 7/22, her son noticed that her numbness was involving her arm and he had only previously heard about her complaining of her leg. She went to see an orthopedist on the day of admission because they were concerned her back may be contributing to her left leg numbness. He noticed difficulty with finger-nose-finger on the left and referred her to the emergency room where a CT scan showed hypodensity in the left occipital region.   LKW: Sunday 7/19  tpa given?: no, outside of window   SUBJECTIVE No family present. The patient is just finishing a bath performed by the nurses aid. Dr Roda Shutters discussed the intracranial stenosis noted on imaging.  OBJECTIVE Most recent Vital Signs: Filed Vitals:   05/09/14 0137 05/09/14 0622 05/09/14 1004 05/09/14 1358  BP: 157/68 153/58 138/65 152/64  Pulse: 71 62 61 87  Temp: 97.3 F (36.3 C) 98.1 F (36.7 C) 97.5 F (36.4 C) 98.2 F (36.8 C)  TempSrc: Oral Oral Oral Oral  Resp: 18 18 18 18   Height:      Weight:      SpO2: 95% 97% 95% 96%   CBG (last 3)  No results found for this basename: GLUCAP,  in the last 72 hours  IV Fluid Intake:     MEDICATIONS  .  stroke: mapping our early stages of recovery book   Does not apply Once  . aspirin EC  325 mg Oral Daily  . enoxaparin (LOVENOX) injection  30 mg Subcutaneous Q24H  . levothyroxine  75 mcg Oral QAC breakfast  . loratadine  10 mg Oral Daily  . LORazepam  0.5 mg Oral BID  . montelukast  10 mg Oral QHS  . simvastatin  20 mg Oral q1800   PRN:  HYDROcodone-acetaminophen, meclizine, senna-docusate, traZODone  Diet:  Cardiac thin liquids Activity:   DVT  Prophylaxis:  Lovenox  CLINICALLY SIGNIFICANT STUDIES Basic Metabolic Panel:  Recent Labs Lab 05/07/14 1720 05/07/14 2046 05/09/14 0443  NA 136* 134* 138  K 4.4 4.2 4.0  CL 97 102 102  CO2 23  --  23  GLUCOSE 98 107* 100*  BUN 13 14 12   CREATININE 0.65 0.60 0.68  CALCIUM 9.2  --  8.6   Liver Function Tests: No results found for this basename: AST, ALT, ALKPHOS, BILITOT, PROT, ALBUMIN,  in the last 168 hours CBC:  Recent Labs Lab 05/07/14 1720 05/07/14 2046 05/09/14 0443  WBC 5.4  --  5.1  HGB 14.4 14.6 12.4  HCT 43.4 43.0 37.6  MCV 90.2  --  88.5  PLT 256  --  220   Coagulation: No results found for this basename: LABPROT, INR,  in the last 168 hours Cardiac Enzymes: No results found for this basename: CKTOTAL, CKMB, CKMBINDEX, TROPONINI,  in the last 168 hours Urinalysis: No results found for this basename: COLORURINE, APPERANCEUR, LABSPEC, PHURINE, GLUCOSEU, HGBUR, BILIRUBINUR, KETONESUR, PROTEINUR, UROBILINOGEN, NITRITE, LEUKOCYTESUR,  in the last 168 hours Lipid Panel    Component Value Date/Time   CHOL 198 05/08/2014 0540   TRIG 86 05/08/2014 0540   HDL 55 05/08/2014 0540   CHOLHDL  3.6 05/08/2014 0540   VLDL 17 05/08/2014 0540   LDLCALC 126* 05/08/2014 0540   HgbA1C  Lab Results  Component Value Date   HGBA1C 5.8* 05/08/2014    Urine Drug Screen:   No results found for this basename: labopia, cocainscrnur, labbenz, amphetmu, thcu, labbarb    Alcohol Level: No results found for this basename: ETH,  in the last 168 hours  Ct Head Wo Contrast 05/07/2014    Probable acute to subacute right occipital infarct without evidence of hemorrhage. Wallace CullensGray matter involvement is not well demonstrated raising the question that this could be related to vasogenic edema. MRI could be used to further evaluate given the possibility that this is related to edema and not ischemia, intravenous contrast material is recommended if that study is performed.    Ct Cervical Spine Wo  Contrast 05/07/2014    Degenerative changes in the cervical spine without evidence of an acute fracture.   Mr [name] JeanBrain W Wo Contrast 05/08/2014    Acute, moderately large right PCA territory infarct. No hemorrhage.   Mr [name] GlennMra Head/brain Wo Cm 05/08/2014   Fetal type origins of the PCAs with occlusion of the proximal P2 segment on the right.  Intracranial atherosclerosis with severe stenoses of the proximal basilar artery and left P2 segment and mild to moderate stenoses of the distal left vertebral artery, left P2 segment, and left MCA bifurcation.    Carotid Doppler  Bilateral: Mild calcified plaque origin ICA. 1-39% ICA stenosis. Vertbral artery flow is antegrade.   2D Echocardiogram    The estimated ejection fraction was in the range of 60% to 65%. - Aortic valve: There was mild to moderate stenosis. There was mild regurgitation. Valve area (VTI): 0.8 cm^2. Valve area (Vmax): 0.76 cm^2. - Mitral valve: There was mild regurgitation. - Atrial septum: No defect or patent foramen ovale was identified.  EKG - Normal sinus rhythm rate 79 beats per minute. Possible Inferior infarct , age undetermined Cannot rule out Anterior infarct , age undetermined. T wave abnormality, consider lateral ischemia Abnormal ECG.    For complete results please see formal report.   Therapy Recommendations - skilled nursing facility recommended  Physical Exam   Mental Status:  Patient is awake, alert, oriented to person, place, month, year, and situation.  Immediate and remote memory are intact.  Patient is able to give a clear and coherent history.  No signs of aphasia or neglect  Cranial Nerves:  II: Visual Fields are notable for left hemianopia. Pupils are equal, round, and reactive to light. Discs are difficult to visualize.  III,IV, VI: EOMI without ptosis or diploplia.  V: Facial sensation is symmetric to temperature  VII: Facial movement is symmetric.  VIII: hearing is intact to voice  X: Uvula elevates  symmetrically  XI: Shoulder shrug is symmetric.  XII: tongue is midline without atrophy or fasciculations.  Motor:  Tone is normal. Bulk is normal. 5/5 strength was present on the right, 4/5 left leg due to brace and recent repair. 5/5 b/l UEs.  Sensory:  Sensation is symmetric to light touch and temperature in the arms, not able to test the left leg due to brace.  Deep Tendon Reflexes:  2+ and symmetric in the biceps and right patella, unable to test left patella due to brace  Cerebellar:  FTN intact bilaterally.  Gait:  Not assessed due to recent left hip dislocation.   ASSESSMENT Ms. Edrick KinsMarjorie D Frees is a 80 y.o. female presenting with left hemiparesis and left  hemisensory deficits. T-PA therapy was not initiated secondary to late presentation. MRI - Acute, moderately large right PCA territory infarct. Infarct felt to be thrombotic secondary to diffuse atherosclerotic cerebrovascular disease. On No antithrombotics prior to admission. Now on aspirin 325 mg orally every day for secondary stroke prevention. Patient with resultant mild left hemiparesis. Stroke work up complete.   Older age  Hyperlipidemia - cholesterol 198; LDL 126 - no stab prior to admission - now on Zocor  Intracranial stenosis especially at posterior circulation  Previous CVA  Mild to moderate aortic stenosis by 2-D echo this admission.  Hemoglobin A1c 5.8  Hospital day # 2  TREATMENT/PLAN  Change aspirin to clopidogrel 75 mg orally every day for secondary stroke prevention.  Continue station for stroke prevention.  Stroke risk factor modification. Pt needs to see her PCP within one week after discharge from rehab  Stroke work up complete - therapist recommends skilled nursing facility placement.  Stroke team will sign off. Please call if we can be of further service.  Followup Dr. Roda Shutters in 2 months  Delton See PA-C [facility] Pager [phone] 05/09/2014, 4:16 PM   SIGNED I,  the attending vascular neurologist, have personally obtained a history, examined the patient, evaluated laboratory data, individually viewed imaging studies, and formulated the assessment and plan of care.  I have made any additions or clarifications directly to the above note and agree with the findings and plan as currently documented.   Marvel Plan, MD PhD 05/09/2014 9:56 PM   To contact Stroke Continuity provider, please refer to [email or web address]. After hours, contact General Neurology

## 2014-05-09 NOTE — Progress Notes (Signed)
[facility] PROGRESS NOTE   [name] ZOX:[id number] DOB: [birth year] DOA: 05/07/2014 PCP: Pearson Grippe, MD  HPI/Subjective: Continues to have left upper extremity numbness.  Assessment/Plan: Principal Problem:   CVA (cerebral infarction) Active Problems:   Dyslipidemia   Chronic low back pain   Dislocation of hip prosthesis    Acute CVA -Patient presented to the hospital with left upper extremity numbness and minimal weakness. -CT/MRI of the brain showed a right PCA, P2 segment stroke. -Severe stenosis of the proximal basilar artery, left P2 segment and moderate stenosis of left vertebral artery and left MCA. -Start patient on aspirin, await neurology recommendations. -Discontinue estrogen vaginal ring. -PT/OT, stroke team to follow.  Dyslipidemia -Not understanding, will start simvastatin, check fasting lipid profile. -Discontinue estrogen vaginal ring.  Chronic low back pain -Continue Vicodin as needed.  Dislocation of hip prosthesis -Recent dislocation of hip prosthesis on 05/06/2014, reduced under conscious sedation. -Appreciate Dr. Deri Fuelling  help.   Code Status: Full code Family Communication: Plan discussed with the patient. Disposition Plan: Remains inpatient   Consultants:  Neurology  Procedures:  None  Antibiotics:  None   Objective: Filed Vitals:   05/09/14 1004  BP: 138/65  Pulse: 61  Temp: 97.5 F (36.4 C)  Resp: 18    Intake/Output Summary (Last 24 hours) at 05/09/14 1336 Last data filed at 05/09/14 0843  Gross per 24 hour  Intake    240 ml  Output      0 ml  Net    240 ml   Filed Weights   05/07/14 1853 05/07/14 2300  Weight: 68.04 kg (150 lb) 68.04 kg (150 lb)    Exam: General: Alert and awake, oriented x3, not in any acute distress. HEENT: anicteric sclera, pupils reactive to light and accommodation, EOMI CVS: S1-S2 clear, no murmur rubs or gallops Chest: clear to auscultation bilaterally, no wheezing,  rales or rhonchi Abdomen: soft nontender, nondistended, normal bowel sounds, no organomegaly Extremities: no cyanosis, clubbing or edema noted bilaterally Neuro: Cranial nerves II-XII intact, no focal neurological deficits  Data Reviewed: Basic Metabolic Panel:  Recent Labs Lab 05/06/14 0033 05/07/14 1720 05/07/14 2046 05/09/14 0443  NA 137 136* 134* 138  K 4.0 4.4 4.2 4.0  CL 100 97 102 102  CO2  --  23  --  23  GLUCOSE 104* 98 107* 100*  BUN 14 13 14 12   CREATININE 0.80 0.65 0.60 0.68  CALCIUM  --  9.2  --  8.6   Liver Function Tests: No results found for this basename: AST, ALT, ALKPHOS, BILITOT, PROT, ALBUMIN,  in the last 168 hours No results found for this basename: LIPASE, AMYLASE,  in the last 168 hours No results found for this basename: AMMONIA,  in the last 168 hours CBC:  Recent Labs Lab 05/06/14 0025 05/06/14 0033 05/07/14 1720 05/07/14 2046 05/09/14 0443  WBC 5.5  --  5.4  --  5.1  HGB 13.3 13.9 14.4 14.6 12.4  HCT 39.0 41.0 43.4 43.0 37.6  MCV 87.1  --  90.2  --  88.5  PLT 224  --  256  --  220   Cardiac Enzymes: No results found for this basename: CKTOTAL, CKMB, CKMBINDEX, TROPONINI,  in the last 168 hours BNP (last 3 results) No results found for this basename: PROBNP,  in the last 8760 hours CBG: No results found for this basename: GLUCAP,  in the last 168 hours  Micro No results found for this or any previous visit (  from the past 240 hour(s)).   Studies: Ct Head Wo Contrast  05/07/2014   CLINICAL DATA:  Left-sided arm a numbness.  EXAM: CT HEAD WITHOUT CONTRAST  CT CERVICAL SPINE WITHOUT CONTRAST  TECHNIQUE: Multidetector CT imaging of the head and cervical spine was performed following the standard protocol without intravenous contrast. Multiplanar CT image reconstructions of the cervical spine were also generated.  COMPARISON:  Head CT from 02/05/2008.  FINDINGS: CT HEAD FINDINGS  There is no evidence for acute hemorrhage, hydrocephalus,  midline shift, or abnormal extra-axial fluid collection. Low attenuation in the medial right occipital lobe is new in the interval and raises concern for right occipital lobe acute to subacute infarct. Edema from an un seen underlying mass is considered less likely but not excluded.  Chronic lacunar infarct is seen in the anterior limb of the right internal capsule, as before. Diffuse loss of parenchymal volume is consistent with atrophy. Patchy low attenuation in the deep hemispheric and periventricular white matter is nonspecific, but likely reflects chronic microvascular ischemic demyelination.  The visualized paranasal sinuses and mastoid air cells are clear. Fluid and posterior mastoid air cells is unchanged in the long interval since the prior study.  CT CERVICAL SPINE FINDINGS  Imaging was obtained from the skullbase through the T2-3 interspace. No evidence for fracture. Three-4 mm anterolisthesis of C7 on T1 is likely related to the advanced facet arthropathy at the same level. There is degenerative disc and endplate disease at C4-5, C5-6, and C6-7. The facets are aligned bilaterally. Mild left-sided foraminal encroachment at C4-5 is secondary to uncinate spurring and facet overgrowth. Moderate right-sided foraminal stenosis at C5-6 is secondary to uncinate spurring and facet overgrowth. The Normal cervical lordosis is preserved. There is no prevertebral soft tissue swelling.  IMPRESSION: Probable acute to subacute right occipital infarct without evidence of hemorrhage. Wallace Cullens matter involvement is not well demonstrated raising the question that this could be related to vasogenic edema. MRI could be used to further evaluate given the possibility that this is related to edema and not ischemia, intravenous contrast material is recommended if that study is performed.  Degenerative changes in the cervical spine without evidence of an acute fracture.  I discussed these findings by telephone with the nurse  practitioner, Sabino Dick, at 2139 hrs on 05/07/2014.   Electronically Signed   By: Kennith Center M.D.   On: 05/07/2014 21:40   Ct Cervical Spine Wo Contrast  05/07/2014   CLINICAL DATA:  Left-sided arm a numbness.  EXAM: CT HEAD WITHOUT CONTRAST  CT CERVICAL SPINE WITHOUT CONTRAST  TECHNIQUE: Multidetector CT imaging of the head and cervical spine was performed following the standard protocol without intravenous contrast. Multiplanar CT image reconstructions of the cervical spine were also generated.  COMPARISON:  Head CT from 02/05/2008.  FINDINGS: CT HEAD FINDINGS  There is no evidence for acute hemorrhage, hydrocephalus, midline shift, or abnormal extra-axial fluid collection. Low attenuation in the medial right occipital lobe is new in the interval and raises concern for right occipital lobe acute to subacute infarct. Edema from an un seen underlying mass is considered less likely but not excluded.  Chronic lacunar infarct is seen in the anterior limb of the right internal capsule, as before. Diffuse loss of parenchymal volume is consistent with atrophy. Patchy low attenuation in the deep hemispheric and periventricular white matter is nonspecific, but likely reflects chronic microvascular ischemic demyelination.  The visualized paranasal sinuses and mastoid air cells are clear. Fluid and posterior mastoid air  cells is unchanged in the long interval since the prior study.  CT CERVICAL SPINE FINDINGS  Imaging was obtained from the skullbase through the T2-3 interspace. No evidence for fracture. Three-4 mm anterolisthesis of C7 on T1 is likely related to the advanced facet arthropathy at the same level. There is degenerative disc and endplate disease at C4-5, C5-6, and C6-7. The facets are aligned bilaterally. Mild left-sided foraminal encroachment at C4-5 is secondary to uncinate spurring and facet overgrowth. Moderate right-sided foraminal stenosis at C5-6 is secondary to uncinate spurring and facet  overgrowth. The Normal cervical lordosis is preserved. There is no prevertebral soft tissue swelling.  IMPRESSION: Probable acute to subacute right occipital infarct without evidence of hemorrhage. Wallace Cullens matter involvement is not well demonstrated raising the question that this could be related to vasogenic edema. MRI could be used to further evaluate given the possibility that this is related to edema and not ischemia, intravenous contrast material is recommended if that study is performed.  Degenerative changes in the cervical spine without evidence of an acute fracture.  I discussed these findings by telephone with the nurse practitioner, Sabino Dick, at 2139 hrs on 05/07/2014.   Electronically Signed   By: Kennith Center M.D.   On: 05/07/2014 21:40   Mr [name] ZO Contrast  05/08/2014   CLINICAL DATA:  Left leg numbness progressing to involve the left arm and left face. Probable acute right occipital infarct seen on CT.  EXAM: MRI HEAD WITHOUT CONTRAST  MRA HEAD WITHOUT CONTRAST  TECHNIQUE: Multiplanar, multiecho pulse sequences of the brain and surrounding structures were obtained without intravenous contrast. Angiographic images of the head were obtained using MRA technique without contrast.  COMPARISON:  Head CT 05/07/2014  FINDINGS: MRI HEAD FINDINGS  Patchy and confluent restricted diffusion is present in the posterior right temporal lobe, medial right occipital lobe, and lateral aspect of the right thalamus, consistent with acute PCA territory infarct. There is associated mild edema without significant mass effect. There is no evidence of intracranial hemorrhage.  Foci of T2 hyperintensity in the subcortical and deep cerebral white matter elsewhere are nonspecific but compatible with mild chronic small vessel ischemic disease. There is mild to moderate cerebral atrophy. There is no abnormal enhancement. There is no mass, midline shift, or extra-axial fluid collection.  Prior bilateral cataract extraction  is noted. Tiny left maxillary sinus mucous retention cyst is noted. There are small bilateral mastoid effusions. Right PCA flow void is not clearly seen and it may be occluded. Other major intracranial arterial flow voids appear preserved.  MRA HEAD FINDINGS  The visualized distal vertebral arteries are patent and codominant. There is approximately 50% stenosis of the distal left vertebral artery. There is a severe, focal stenosis of the proximal basilar artery. Remainder of the basilar artery is patent without additional stenosis. SCA origins are patent, with question of moderate stenosis on the left. There are fetal type origins of the PCAs bilaterally. There is occlusion of the proximal P2 segment on the right. There is mild left PCA irregularity with a mild to moderate focal stenosis of the proximal P2 segment.  Internal carotid arteries are patent from skullbase to carotid termini. There is mild MCA branch vessel irregularity bilaterally, and there is mild stenosis of the left MCA at the bifurcation. Proximal right MCA is unremarkable. Right ACA is unremarkable. There is a severe focal stenosis of the left A2 segment. No intracranial aneurysm is identified.  IMPRESSION: 1. Acute, moderately large right PCA territory  infarct. No hemorrhage. 2. Fetal type origins of the PCAs with occlusion of the proximal P2 segment on the right. 3. Intracranial atherosclerosis with severe stenoses of the proximal basilar artery and left P2 segment and mild to moderate stenoses of the distal left vertebral artery, left P2 segment, and left MCA bifurcation. These results will be called to the ordering clinician or representative by the Radiologist Assistant, and communication documented in the PACS or zVision Dashboard.   Electronically Signed   By: Sebastian AcheAllen  Grady   On: 05/08/2014 09:18   Mr Maxine GlennMra Head/brain Wo Cm  05/08/2014   CLINICAL DATA:  Left leg numbness progressing to involve the left arm and left face. Probable acute right  occipital infarct seen on CT.  EXAM: MRI HEAD WITHOUT CONTRAST  MRA HEAD WITHOUT CONTRAST  TECHNIQUE: Multiplanar, multiecho pulse sequences of the brain and surrounding structures were obtained without intravenous contrast. Angiographic images of the head were obtained using MRA technique without contrast.  COMPARISON:  Head CT 05/07/2014  FINDINGS: MRI HEAD FINDINGS  Patchy and confluent restricted diffusion is present in the posterior right temporal lobe, medial right occipital lobe, and lateral aspect of the right thalamus, consistent with acute PCA territory infarct. There is associated mild edema without significant mass effect. There is no evidence of intracranial hemorrhage.  Foci of T2 hyperintensity in the subcortical and deep cerebral white matter elsewhere are nonspecific but compatible with mild chronic small vessel ischemic disease. There is mild to moderate cerebral atrophy. There is no abnormal enhancement. There is no mass, midline shift, or extra-axial fluid collection.  Prior bilateral cataract extraction is noted. Tiny left maxillary sinus mucous retention cyst is noted. There are small bilateral mastoid effusions. Right PCA flow void is not clearly seen and it may be occluded. Other major intracranial arterial flow voids appear preserved.  MRA HEAD FINDINGS  The visualized distal vertebral arteries are patent and codominant. There is approximately 50% stenosis of the distal left vertebral artery. There is a severe, focal stenosis of the proximal basilar artery. Remainder of the basilar artery is patent without additional stenosis. SCA origins are patent, with question of moderate stenosis on the left. There are fetal type origins of the PCAs bilaterally. There is occlusion of the proximal P2 segment on the right. There is mild left PCA irregularity with a mild to moderate focal stenosis of the proximal P2 segment.  Internal carotid arteries are patent from skullbase to carotid termini. There is  mild MCA branch vessel irregularity bilaterally, and there is mild stenosis of the left MCA at the bifurcation. Proximal right MCA is unremarkable. Right ACA is unremarkable. There is a severe focal stenosis of the left A2 segment. No intracranial aneurysm is identified.  IMPRESSION: 1. Acute, moderately large right PCA territory infarct. No hemorrhage. 2. Fetal type origins of the PCAs with occlusion of the proximal P2 segment on the right. 3. Intracranial atherosclerosis with severe stenoses of the proximal basilar artery and left P2 segment and mild to moderate stenoses of the distal left vertebral artery, left P2 segment, and left MCA bifurcation. These results will be called to the ordering clinician or representative by the Radiologist Assistant, and communication documented in the PACS or zVision Dashboard.   Electronically Signed   By: Sebastian AcheAllen  Grady   On: 05/08/2014 09:18    Scheduled Meds: .  stroke: mapping our early stages of recovery book   Does not apply Once  . aspirin EC  325 mg Oral Daily  .  enoxaparin (LOVENOX) injection  30 mg Subcutaneous Q24H  . levothyroxine  75 mcg Oral QAC breakfast  . loratadine  10 mg Oral Daily  . LORazepam  0.5 mg Oral BID  . montelukast  10 mg Oral QHS  . simvastatin  20 mg Oral q1800   Continuous Infusions:      Time spent: 35 minutes    [facility]  [facility] Pager [phone] If 7PM-7AM, please contact night-coverage at [email or web address], password [facility] 05/09/2014, 1:36 PM  LOS: 2 days

## 2014-05-09 NOTE — Progress Notes (Signed)
IM (Important Message ) from [employer] given to patient about his rights; B Trevonte Ashkar RN,BSN,MHA [phone] 

## 2014-05-09 NOTE — Progress Notes (Signed)
I was asked by Dr. Arthor CaptainElmahi to see Ms. [name] in regards to her hip dislocation 3 days ago. She twisted in bed and dislocated her left hip 3 days ago which was closed [facility]. She has no hip complaints today. She does not have pain on range of motion of the hip. She needs to wear the knee immobilizer for 2 weeks at all times except for bathing/showering. We can see her back in the office in one month.

## 2014-05-09 NOTE — Progress Notes (Signed)
Physical Therapy Treatment Patient Details Name: [name] KinsMarjorie D Jobson MRN: [id number] DOB: 07-21-1922 Today's Date: 05/09/2014    History of Present Illness 80 y.o. year old female with significant past medical history of chronic low back pain, chronic hip s/p bilateral hip arthroplasty with recent hip dislocation s/p reduction under anesthesia presenting with CVA. Pt presenting to ED for Lt sided weakness; MRI revealed acute rt PCA infarct. pt also had Lt hip dislocation and reduction in ED 7/21.     PT Comments    Pt continues to be very pleasant and agreeable to therapies. Pt very motivated to return to P[facility] and be independent. Pt very concerned regarding incr numbness on Lt sided; MD is aware. Cont to recommend SNF for post acute rehab to increase independence prior to returning to her independent living apartment.   Follow Up Recommendations  SNF;Supervision/Assistance - 24 hour     Equipment Recommendations  None recommended by PT    Recommendations for Other Services       Precautions / Restrictions Precautions Precautions: Fall Required Braces or Orthoses: Knee Immobilizer - Left Knee Immobilizer - Left: Other (comment);On at all times (per MD note in chart) Restrictions Weight Bearing Restrictions: Yes LLE Weight Bearing: Weight bearing as tolerated Other Position/Activity Restrictions: spoke with attending, Elmahi, approved WBAT; will follow up with ortho when consulted    Mobility  Bed Mobility Overal bed mobility: Needs Assistance Bed Mobility: Supine to Sit     Supine to sit: Min guard;HOB elevated     General bed mobility comments: difficulty today with advancing trunk to sitting position; min guard to facilitate movement   Transfers Overall transfer level: Needs assistance Equipment used: 4-wheeled walker Transfers: Sit to/from Stand Sit to Stand: Min assist         General transfer comment: min (A) required today; pt with heavy lean anteriorly;  required incr time to acheive and power up; cues and (A) for brake management on rollator   Ambulation/Gait Ambulation/Gait assistance: Min assist Ambulation Distance (Feet): 100 Feet Assistive device: 4-wheeled walker Gait Pattern/deviations: Decreased stance time - left;Decreased step length - right;Decreased stride length;Narrow base of support;Trunk flexed Gait velocity: decreased Gait velocity interpretation: Below normal speed for age/gender General Gait Details: pt ambulating on toes initially; c/o numbness in lt LE and unable to feel foot on ground; cues for upright posture and sequencing and min (A) to maintain balance with turns   Careers information officertairs            Wheelchair Mobility    Modified Rankin (Stroke Patients Only) Modified Rankin (Stroke Patients Only) Pre-Morbid Rankin Score: Slight disability Modified Rankin: Moderately severe disability     Balance Overall balance assessment: Needs assistance Sitting-balance support: Feet supported;No upper extremity supported Sitting balance-Leahy Scale: Good Sitting balance - Comments: no c/o dizziness   Standing balance support: During functional activity;Bilateral upper extremity supported Standing balance-Leahy Scale: Poor Standing balance comment: unsteady today; use of RW              High level balance activites: Head turns;Direction changes High Level Balance Comments: pt continues to have difficulty managing rollator around obstacles     Cognition Arousal/Alertness: Awake/alert Behavior During Therapy: WFL for tasks assessed/performed Overall Cognitive Status: Within Functional Limits for tasks assessed                      Exercises Low Level/ICU Exercises Ankle Circles/Pumps: AROM;Both;10 reps;Supine    General Comments General comments (skin integrity, edema, etc.):  c/o incr numbness on Lt LE       Pertinent Vitals/Pain Pt c/o numbness in Lt side; no c/o pain     Home Living                       Prior Function            PT Goals (current goals can now be found in the care plan section) Acute Rehab PT Goals Patient Stated Goal: to get back to my apartment  PT Goal Formulation: With patient Time For Goal Achievement: 05/12/14 Potential to Achieve Goals: Good Progress towards PT goals: Progressing toward goals    Frequency  Min 3X/week    PT Plan Current plan remains appropriate    Co-evaluation             End of Session Equipment Utilized During Treatment: Gait belt;Left knee immobilizer Activity Tolerance: Patient tolerated treatment well Patient left: in chair;with call bell/phone within reach;with chair alarm set     Time: 4098-1191 PT Time Calculation (min): 25 min  Charges:  Automatic Data Training: 23-37 mins                    G CodesShelva Majestic [place], [place] [phone] 05/09/2014, 4:26 PM

## 2014-05-10 MED ORDER — SIMVASTATIN 20 MG PO TABS: 20.0000 mg | ORAL_TABLET | Freq: Every day | ORAL | Status: DC

## 2014-05-10 MED ORDER — LORAZEPAM 1 MG PO TABS: 0.5000 mg | ORAL_TABLET | Freq: Two times a day (BID) | ORAL | Status: DC

## 2014-05-10 MED ORDER — CLOPIDOGREL BISULFATE 75 MG PO TABS: 75.0000 mg | ORAL_TABLET | Freq: Every day | ORAL | Status: DC

## 2014-05-10 MED ORDER — HYDROCODONE-ACETAMINOPHEN 5-325 MG PO TABS
1.0000 | ORAL_TABLET | Freq: Four times a day (QID) | ORAL | Status: DC | PRN
Start: 2014-05-10 — End: 2014-07-16

## 2014-05-10 NOTE — Progress Notes (Signed)
Pt d/c to SNF by ambulance. Assessment stable. [facility]

## 2014-05-10 NOTE — Discharge Summary (Signed)
Physician Discharge Summary  [name] KinsMarjorie D [name] ZOX:[id number] DOB: [birth year] DOA: 05/07/2014  PCP: Pearson GrippeKIM, JAMES, MD  Admit date: 05/07/2014 Discharge date: 05/10/2014  Time spent: 40 minutes  Recommendations for Outpatient Follow-up:  1. Follow up with the stroke MD in 2 weeks, Dr. Roda ShuttersXu.  Discharge Diagnoses:  Principal Problem:   CVA (cerebral infarction) Active Problems:   Dyslipidemia   Chronic low back pain   Dislocation of hip prosthesis   Discharge Condition: Stable  Diet recommendation: Heart healthy  Filed Weights   05/07/14 1853 05/07/14 2300  Weight: 68.04 kg (150 lb) 68.04 kg (150 lb)    History of present illness:  This is a 80 y.o. year old female with significant past medical history of chronic low back pain, chronic hip s/p bilateral hip arthroplasty with recent hip dislocation s/p reduction under anesthesia presenting with CVA. Pt currently lives in a senior community with combined independent living, assisted-living, skilled nursing. Patient has been living independently up until recently. Her son, patient has had recurrent episodes of bilateral lower tremor the numbness and paresthesias as well as left upper extremity numbness and paresthesias. Patient had a spontaneous dislocation of her left hip over the weekend. Had this reduced by orthopedics and anesthesia on Monday. Symptoms were present prior to spontaneous dislocation. Was seen by PCP for symptoms of paresthesias. Was recommended patient followup for evaluation. However, the scene community next the patient's orthopedic visit. During this visit, orthopedics felt that patient needed to be further evaluated for her symptoms. Patient was redirected to the ER for further evaluation.  On presentation, hemodynamically stable. Afebrile. S and within normal limits. Head CT obtained shows probable acute to subacute right occipital infarct without evidence of hemorrhage however no significant grave matter of all meds with a  question of vasogenic edema. Degenerative changes were noted on the cervical spine. Hip x-ray within normal limits. Patient and son deny any history of stroke or malignancy in the past. Denies any recent infections.  Hospital Course:   Acute CVA  -Patient presented to the hospital with left upper extremity numbness and minimal weakness.  -CT/MRI of the brain showed a right PCA, P2 segment stroke.  -Severe stenosis of the proximal basilar artery, left P2 segment and moderate stenosis of left vertebral artery and left MCA.  -Start patient on aspirin, await neurology recommendations.  -Discontinue estrogen vaginal ring.  -PT/OT recommended SNF. -Neurology recommended Plavix for secondary stroke prevention.  Dyslipidemia  -Not understanding, will start simvastatin, check fasting lipid profile.  -Discontinue estrogen vaginal ring.   Chronic low back pain  -Continue Vicodin as needed.   Dislocation of hip prosthesis  -Recent dislocation of hip prosthesis on 05/06/2014, reduced under conscious sedation.  -Appreciate Dr. Deri FuellingAluisio's help.   Procedures:  None  Consultations:  Stroke team.  Dr. Lequita HaltAluisio  Discharge Exam: Filed Vitals:   05/10/14 0541  BP: 114/68  Pulse: 60  Temp: 98.2 F (36.8 C)  Resp: 16   General: Alert and awake, oriented x3, not in any acute distress. HEENT: anicteric sclera, pupils reactive to light and accommodation, EOMI CVS: S1-S2 clear, no murmur rubs or gallops Chest: clear to auscultation bilaterally, no wheezing, rales or rhonchi Abdomen: soft nontender, nondistended, normal bowel sounds, no organomegaly Extremities: no cyanosis, clubbing or edema noted bilaterally Neuro: Cranial nerves II-XII intact, no focal neurological deficits  Discharge Instructions You were cared for by a hospitalist during your hospital stay. If you have any questions about your discharge medications or the care you received  while you were in the hospital after you are  discharged, you can call the unit and asked to speak with the hospitalist on call if the hospitalist that took care of you is not available. Once you are discharged, your primary care physician will handle any further medical issues. Please note that NO REFILLS for any discharge medications will be authorized once you are discharged, as it is imperative that you return to your primary care physician (or establish a relationship with a primary care physician if you do not have one) for your aftercare needs so that they can reassess your need for medications and monitor your lab values.      Discharge Instructions   Diet - low sodium heart healthy    Complete by:  As directed      Increase activity slowly    Complete by:  As directed             Medication List    STOP taking these medications       estradiol 2 MG vaginal ring  Commonly known as:  ESTRING      TAKE these medications       cetirizine 10 MG tablet  Commonly known as:  ZYRTEC  Take 10 mg by mouth daily.     clopidogrel 75 MG tablet  Commonly known as:  PLAVIX  Take 1 tablet (75 mg total) by mouth daily.     HYDROcodone-acetaminophen 5-325 MG per tablet  Commonly known as:  NORCO/VICODIN  Take 1 tablet by mouth every 6 (six) hours as needed for moderate pain.     levothyroxine 75 MCG tablet  Commonly known as:  SYNTHROID, LEVOTHROID  Take 75 mcg by mouth daily.     LORazepam 1 MG tablet  Commonly known as:  ATIVAN  Take 0.5 tablets (0.5 mg total) by mouth 2 (two) times daily.     meclizine 25 MG tablet  Commonly known as:  ANTIVERT  Take 25 mg by mouth 3 (three) times daily as needed for dizziness.     montelukast 10 MG tablet  Commonly known as:  SINGULAIR  Take 10 mg by mouth at bedtime.     simvastatin 20 MG tablet  Commonly known as:  ZOCOR  Take 1 tablet (20 mg total) by mouth daily at 6 PM.       Allergies  Allergen Reactions  . Augmentin [Amoxicillin-Pot Clavulanate] Nausea Only    Follow-up Information   Follow up with Xu,Jindong, MD. Schedule an appointment as soon as possible for a visit in 2 months.   Specialty:  Neurology   Contact information:   [address] [place] [address] [phone]        The results of significant diagnostics from this hospitalization (including imaging, microbiology, ancillary and laboratory) are listed below for reference.    Significant Diagnostic Studies: Dg Pelvis 1-2 Views  05/06/2014   CLINICAL DATA:  Status post reduction of left hip dislocation.  EXAM: PELVIS - 1-2 VIEW  COMPARISON:  Left hip radiographs performed earlier today at 12:36 a.m.  FINDINGS: There has been successful reduction of the patient's previously noted left hip prosthesis dislocation. Both prostheses demonstrate grossly normal alignment, and are unremarkable in appearance, though incompletely imaged on this study. The screw of the left-sided prosthesis extends into the left pelvic sidewall. No definite loosening is seen. Postoperative change is noted at the pubic symphysis.  There is no evidence of fracture. The visualized bowel gas pattern is  grossly unremarkable.  IMPRESSION: Successful reduction of previously noted left hip prosthesis dislocation. No evidence of fracture or loosening.   Electronically Signed   By: Roanna Raider M.D.   On: 05/06/2014 02:35   Dg Pelvis 1-2 Views  05/06/2014   CLINICAL DATA:  Rolled over in bed.  Left hip pain.  EXAM: PELVIS - 1-2 VIEW  COMPARISON:  None.  FINDINGS: There is superior dislocation of the patient's left hip arthroplasty. No fracture is seen. There is no evidence of loosening. The visualized bilateral prostheses are otherwise grossly unremarkable. Postoperative change is noted about the pubic symphysis.  Minimal sclerotic change is seen at the sacroiliac joints. The visualized bowel gas pattern is grossly unremarkable.  IMPRESSION: Superior dislocation of the patient's left hip arthroplasty. No  fracture seen. No evidence of loosening.   Electronically Signed   By: Roanna Raider M.D.   On: 05/06/2014 01:09   Ct Head Wo Contrast  05/07/2014   CLINICAL DATA:  Left-sided arm a numbness.  EXAM: CT HEAD WITHOUT CONTRAST  CT CERVICAL SPINE WITHOUT CONTRAST  TECHNIQUE: Multidetector CT imaging of the head and cervical spine was performed following the standard protocol without intravenous contrast. Multiplanar CT image reconstructions of the cervical spine were also generated.  COMPARISON:  Head CT from 02/05/2008.  FINDINGS: CT HEAD FINDINGS  There is no evidence for acute hemorrhage, hydrocephalus, midline shift, or abnormal extra-axial fluid collection. Low attenuation in the medial right occipital lobe is new in the interval and raises concern for right occipital lobe acute to subacute infarct. Edema from an un seen underlying mass is considered less likely but not excluded.  Chronic lacunar infarct is seen in the anterior limb of the right internal capsule, as before. Diffuse loss of parenchymal volume is consistent with atrophy. Patchy low attenuation in the deep hemispheric and periventricular white matter is nonspecific, but likely reflects chronic microvascular ischemic demyelination.  The visualized paranasal sinuses and mastoid air cells are clear. Fluid and posterior mastoid air cells is unchanged in the long interval since the prior study.  CT CERVICAL SPINE FINDINGS  Imaging was obtained from the skullbase through the T2-3 interspace. No evidence for fracture. Three-4 mm anterolisthesis of C7 on T1 is likely related to the advanced facet arthropathy at the same level. There is degenerative disc and endplate disease at C4-5, C5-6, and C6-7. The facets are aligned bilaterally. Mild left-sided foraminal encroachment at C4-5 is secondary to uncinate spurring and facet overgrowth. Moderate right-sided foraminal stenosis at C5-6 is secondary to uncinate spurring and facet overgrowth. The Normal  cervical lordosis is preserved. There is no prevertebral soft tissue swelling.  IMPRESSION: Probable acute to subacute right occipital infarct without evidence of hemorrhage. Wallace Cullens matter involvement is not well demonstrated raising the question that this could be related to vasogenic edema. MRI could be used to further evaluate given the possibility that this is related to edema and not ischemia, intravenous contrast material is recommended if that study is performed.  Degenerative changes in the cervical spine without evidence of an acute fracture.  I discussed these findings by telephone with the nurse practitioner, Sabino Dick, at 2139 hrs on 05/07/2014.   Electronically Signed   By: Kennith Center M.D.   On: 05/07/2014 21:40   Ct Cervical Spine Wo Contrast  05/07/2014   CLINICAL DATA:  Left-sided arm a numbness.  EXAM: CT HEAD WITHOUT CONTRAST  CT CERVICAL SPINE WITHOUT CONTRAST  TECHNIQUE: Multidetector CT imaging of the head and cervical  spine was performed following the standard protocol without intravenous contrast. Multiplanar CT image reconstructions of the cervical spine were also generated.  COMPARISON:  Head CT from 02/05/2008.  FINDINGS: CT HEAD FINDINGS  There is no evidence for acute hemorrhage, hydrocephalus, midline shift, or abnormal extra-axial fluid collection. Low attenuation in the medial right occipital lobe is new in the interval and raises concern for right occipital lobe acute to subacute infarct. Edema from an un seen underlying mass is considered less likely but not excluded.  Chronic lacunar infarct is seen in the anterior limb of the right internal capsule, as before. Diffuse loss of parenchymal volume is consistent with atrophy. Patchy low attenuation in the deep hemispheric and periventricular white matter is nonspecific, but likely reflects chronic microvascular ischemic demyelination.  The visualized paranasal sinuses and mastoid air cells are clear. Fluid and posterior mastoid  air cells is unchanged in the long interval since the prior study.  CT CERVICAL SPINE FINDINGS  Imaging was obtained from the skullbase through the T2-3 interspace. No evidence for fracture. Three-4 mm anterolisthesis of C7 on T1 is likely related to the advanced facet arthropathy at the same level. There is degenerative disc and endplate disease at C4-5, C5-6, and C6-7. The facets are aligned bilaterally. Mild left-sided foraminal encroachment at C4-5 is secondary to uncinate spurring and facet overgrowth. Moderate right-sided foraminal stenosis at C5-6 is secondary to uncinate spurring and facet overgrowth. The Normal cervical lordosis is preserved. There is no prevertebral soft tissue swelling.  IMPRESSION: Probable acute to subacute right occipital infarct without evidence of hemorrhage. Wallace Cullens matter involvement is not well demonstrated raising the question that this could be related to vasogenic edema. MRI could be used to further evaluate given the possibility that this is related to edema and not ischemia, intravenous contrast material is recommended if that study is performed.  Degenerative changes in the cervical spine without evidence of an acute fracture.  I discussed these findings by telephone with the nurse practitioner, Sabino Dick, at 2139 hrs on 05/07/2014.   Electronically Signed   By: Kennith Center M.D.   On: 05/07/2014 21:40   Mr [name] WJ Contrast  05/08/2014   CLINICAL DATA:  Left leg numbness progressing to involve the left arm and left face. Probable acute right occipital infarct seen on CT.  EXAM: MRI HEAD WITHOUT CONTRAST  MRA HEAD WITHOUT CONTRAST  TECHNIQUE: Multiplanar, multiecho pulse sequences of the brain and surrounding structures were obtained without intravenous contrast. Angiographic images of the head were obtained using MRA technique without contrast.  COMPARISON:  Head CT 05/07/2014  FINDINGS: MRI HEAD FINDINGS  Patchy and confluent restricted diffusion is present in the  posterior right temporal lobe, medial right occipital lobe, and lateral aspect of the right thalamus, consistent with acute PCA territory infarct. There is associated mild edema without significant mass effect. There is no evidence of intracranial hemorrhage.  Foci of T2 hyperintensity in the subcortical and deep cerebral white matter elsewhere are nonspecific but compatible with mild chronic small vessel ischemic disease. There is mild to moderate cerebral atrophy. There is no abnormal enhancement. There is no mass, midline shift, or extra-axial fluid collection.  Prior bilateral cataract extraction is noted. Tiny left maxillary sinus mucous retention cyst is noted. There are small bilateral mastoid effusions. Right PCA flow void is not clearly seen and it may be occluded. Other major intracranial arterial flow voids appear preserved.  MRA HEAD FINDINGS  The visualized distal vertebral arteries are patent  and codominant. There is approximately 50% stenosis of the distal left vertebral artery. There is a severe, focal stenosis of the proximal basilar artery. Remainder of the basilar artery is patent without additional stenosis. SCA origins are patent, with question of moderate stenosis on the left. There are fetal type origins of the PCAs bilaterally. There is occlusion of the proximal P2 segment on the right. There is mild left PCA irregularity with a mild to moderate focal stenosis of the proximal P2 segment.  Internal carotid arteries are patent from skullbase to carotid termini. There is mild MCA branch vessel irregularity bilaterally, and there is mild stenosis of the left MCA at the bifurcation. Proximal right MCA is unremarkable. Right ACA is unremarkable. There is a severe focal stenosis of the left A2 segment. No intracranial aneurysm is identified.  IMPRESSION: 1. Acute, moderately large right PCA territory infarct. No hemorrhage. 2. Fetal type origins of the PCAs with occlusion of the proximal P2 segment  on the right. 3. Intracranial atherosclerosis with severe stenoses of the proximal basilar artery and left P2 segment and mild to moderate stenoses of the distal left vertebral artery, left P2 segment, and left MCA bifurcation. These results will be called to the ordering clinician or representative by the Radiologist Assistant, and communication documented in the PACS or zVision Dashboard.   Electronically Signed   By: Sebastian Ache   On: 05/08/2014 09:18   Mr [name] Head/brain Wo Cm  05/08/2014   CLINICAL DATA:  Left leg numbness progressing to involve the left arm and left face. Probable acute right occipital infarct seen on CT.  EXAM: MRI HEAD WITHOUT CONTRAST  MRA HEAD WITHOUT CONTRAST  TECHNIQUE: Multiplanar, multiecho pulse sequences of the brain and surrounding structures were obtained without intravenous contrast. Angiographic images of the head were obtained using MRA technique without contrast.  COMPARISON:  Head CT 05/07/2014  FINDINGS: MRI HEAD FINDINGS  Patchy and confluent restricted diffusion is present in the posterior right temporal lobe, medial right occipital lobe, and lateral aspect of the right thalamus, consistent with acute PCA territory infarct. There is associated mild edema without significant mass effect. There is no evidence of intracranial hemorrhage.  Foci of T2 hyperintensity in the subcortical and deep cerebral white matter elsewhere are nonspecific but compatible with mild chronic small vessel ischemic disease. There is mild to moderate cerebral atrophy. There is no abnormal enhancement. There is no mass, midline shift, or extra-axial fluid collection.  Prior bilateral cataract extraction is noted. Tiny left maxillary sinus mucous retention cyst is noted. There are small bilateral mastoid effusions. Right PCA flow void is not clearly seen and it may be occluded. Other major intracranial arterial flow voids appear preserved.  MRA HEAD FINDINGS  The visualized distal vertebral arteries  are patent and codominant. There is approximately 50% stenosis of the distal left vertebral artery. There is a severe, focal stenosis of the proximal basilar artery. Remainder of the basilar artery is patent without additional stenosis. SCA origins are patent, with question of moderate stenosis on the left. There are fetal type origins of the PCAs bilaterally. There is occlusion of the proximal P2 segment on the right. There is mild left PCA irregularity with a mild to moderate focal stenosis of the proximal P2 segment.  Internal carotid arteries are patent from skullbase to carotid termini. There is mild MCA branch vessel irregularity bilaterally, and there is mild stenosis of the left MCA at the bifurcation. Proximal right MCA is unremarkable. Right ACA is  unremarkable. There is a severe focal stenosis of the left A2 segment. No intracranial aneurysm is identified.  IMPRESSION: 1. Acute, moderately large right PCA territory infarct. No hemorrhage. 2. Fetal type origins of the PCAs with occlusion of the proximal P2 segment on the right. 3. Intracranial atherosclerosis with severe stenoses of the proximal basilar artery and left P2 segment and mild to moderate stenoses of the distal left vertebral artery, left P2 segment, and left MCA bifurcation. These results will be called to the ordering clinician or representative by the Radiologist Assistant, and communication documented in the PACS or zVision Dashboard.   Electronically Signed   By: Sebastian Ache   On: 05/08/2014 09:18    Microbiology: No results found for this or any previous visit (from the past 240 hour(s)).   Labs: Basic Metabolic Panel:  Recent Labs Lab 05/06/14 0033 05/07/14 1720 05/07/14 2046 05/09/14 0443  NA 137 136* 134* 138  K 4.0 4.4 4.2 4.0  CL 100 97 102 102  CO2  --  23  --  23  GLUCOSE 104* 98 107* 100*  BUN 14 13 14 12   CREATININE 0.80 0.65 0.60 0.68  CALCIUM  --  9.2  --  8.6   Liver Function Tests: No results found  for this basename: AST, ALT, ALKPHOS, BILITOT, PROT, ALBUMIN,  in the last 168 hours No results found for this basename: LIPASE, AMYLASE,  in the last 168 hours No results found for this basename: AMMONIA,  in the last 168 hours CBC:  Recent Labs Lab 05/06/14 0025 05/06/14 0033 05/07/14 1720 05/07/14 2046 05/09/14 0443  WBC 5.5  --  5.4  --  5.1  HGB 13.3 13.9 14.4 14.6 12.4  HCT 39.0 41.0 43.4 43.0 37.6  MCV 87.1  --  90.2  --  88.5  PLT 224  --  256  --  220   Cardiac Enzymes: No results found for this basename: CKTOTAL, CKMB, CKMBINDEX, TROPONINI,  in the last 168 hours BNP: BNP (last 3 results) No results found for this basename: PROBNP,  in the last 8760 hours CBG: No results found for this basename: GLUCAP,  in the last 168 hours     Signed:  Redford Behrle A  [facility] 05/10/2014, 9:18 AM

## 2014-05-10 NOTE — Progress Notes (Signed)
[facility] medically stable [facility]. [facility]. RN will [phone]. Clinical Child psychotherapistocial Worker (CSW) [facility] and [facility]. CSW arranged EMS for transport. Patient's son Berneta SagesJamey Traeger is at bedside and aware of above. Nursing is aware of above. Please reconsult if future social work needs arise. CSW signing off.   Jetta LoutBailey Morgan, LCSWA Weekend CSW [phone]

## 2014-05-12 ENCOUNTER — Non-Acute Institutional Stay (SKILLED_NURSING_FACILITY): Payer: Medicare Other | Admitting: Nurse Practitioner

## 2014-05-12 ENCOUNTER — Encounter: Payer: Self-pay | Admitting: Nurse Practitioner

## 2014-05-12 DIAGNOSIS — F418 Other specified anxiety disorders: Secondary | ICD-10-CM

## 2014-05-12 DIAGNOSIS — T84029D Dislocation of unspecified internal joint prosthesis, subsequent encounter: Secondary | ICD-10-CM

## 2014-05-12 DIAGNOSIS — Z87898 Personal history of other specified conditions: Secondary | ICD-10-CM

## 2014-05-12 DIAGNOSIS — E039 Hypothyroidism, unspecified: Secondary | ICD-10-CM

## 2014-05-12 DIAGNOSIS — Z5189 Encounter for other specified aftercare: Secondary | ICD-10-CM

## 2014-05-12 DIAGNOSIS — Z9189 Other specified personal risk factors, not elsewhere classified: Secondary | ICD-10-CM

## 2014-05-12 DIAGNOSIS — M545 Low back pain, unspecified: Secondary | ICD-10-CM

## 2014-05-12 DIAGNOSIS — J310 Chronic rhinitis: Secondary | ICD-10-CM

## 2014-05-12 DIAGNOSIS — F419 Anxiety disorder, unspecified: Secondary | ICD-10-CM

## 2014-05-12 DIAGNOSIS — I6322 Cerebral infarction due to unspecified occlusion or stenosis of basilar arteries: Secondary | ICD-10-CM

## 2014-05-12 DIAGNOSIS — Z96649 Presence of unspecified artificial hip joint: Secondary | ICD-10-CM

## 2014-05-12 DIAGNOSIS — G8929 Other chronic pain: Secondary | ICD-10-CM

## 2014-05-12 DIAGNOSIS — E785 Hyperlipidemia, unspecified: Secondary | ICD-10-CM

## 2014-05-12 DIAGNOSIS — E871 Hypo-osmolality and hyponatremia: Secondary | ICD-10-CM | POA: Insufficient documentation

## 2014-05-12 DIAGNOSIS — F411 Generalized anxiety disorder: Secondary | ICD-10-CM

## 2014-05-12 HISTORY — DX: Other specified anxiety disorders: F41.8

## 2014-05-12 HISTORY — DX: Hypothyroidism, unspecified: E03.9

## 2014-05-12 LAB — BASIC METABOLIC PANEL
BUN: 14 mg/dL (ref 4–21)
Creatinine: 0.7 mg/dL (ref 0.5–1.1)
Glucose: 98 mg/dL
POTASSIUM: 4 mmol/L (ref 3.4–5.3)
SODIUM: 132 mmol/L — AB (ref 137–147)

## 2014-05-12 LAB — CBC AND DIFFERENTIAL
HCT: 38 % (ref 36–46)
HEMOGLOBIN: 13.1 g/dL (ref 12.0–16.0)
PLATELETS: 260 10*3/uL (ref 150–399)
WBC: 5.1 10^3/mL

## 2014-05-12 NOTE — Assessment & Plan Note (Addendum)
05/06/14 X-ray Pelvis:  IMPRESSION:  Successful reduction of previously noted left hip prosthesis  dislocation. No evidence of fracture or loosening.  Prn Hydrocodone/ApAp available to her for pain.   Recent dislocation of hip prosthesis on 05/06/2014, reduced under conscious sedation. Dr. Deri FuellingAluisio's help.

## 2014-05-12 NOTE — Assessment & Plan Note (Signed)
Prn Antivert available to her

## 2014-05-12 NOTE — Assessment & Plan Note (Signed)
Takes Ativan 0.5mg bid.    

## 2014-05-12 NOTE — Progress Notes (Signed)
Patient ID: [name], female   DOB: Dec 19, 1921, 80 y.o.   MRN: [id number]   Code Status: DNR  Allergies  Allergen Reactions  . Augmentin [Amoxicillin-Pot Clavulanate] Nausea Only    Chief Complaint  Patient presents with  . Medical Management of Chronic Issues  . Hospitalization Follow-up    CVA with left soided weakness and s/p dislocated left hip.     HPI: Patient is a 80 y.o. female seen in the SNF at [facility] today for evaluation of  CVA with left sided weakness and numbness/paresthesias,  s/p left hip dislocation,  and other chronic medical conditions.   Hospitalized from 05/07/2014-05/10/2014  For CVA (cerebral infarction) with mild left sided weakness and Dislocation of the left prosthesis-spontaneous dislocation of her left hip was reduced by orthopedics and anesthesia.  Head CT obtained shows probable acute to subacute right occipital infarct without evidence of hemorrhage however no significant grave matter of all meds with a question of vasogenic edema. Degenerative changes were noted on the cervical spine. Hip x-ray within normal limits.   Problem List Items Addressed This Visit   Nonallergic rhinitis     Takes Zyrtec and Singulair.     Dyslipidemia     Started statin.     Hx of syncope     Prn Antivert available to her    Chronic low back pain     Not new. Prn Hydrocodone/ApAp available to her.     CVA (cerebral infarction)     05/08/14 MRI brain:  IMPRESSION:  1. Acute, moderately large right PCA territory infarct. No  hemorrhage.  2. Fetal type origins of the PCAs with occlusion of the proximal P2  segment on the right.  3. Intracranial atherosclerosis with severe stenoses of the proximal  basilar artery and left P2 segment and mild to moderate stenoses of  the distal left vertebral artery, left P2 segment, and left MCA  bifurcation.  Follow up with the stroke MD in 2 weeks, Dr. Roda Shutters.   Continue Plavix and Zocor for risk reduction.      Dislocation of hip prosthesis - Primary     05/06/14 X-ray Pelvis:  IMPRESSION:  Successful reduction of previously noted left hip prosthesis  dislocation. No evidence of fracture or loosening.  Prn Hydrocodone/ApAp available to her for pain.   Recent dislocation of hip prosthesis on 05/06/2014, reduced under conscious sedation. Dr. Deri Fuelling help.     Unspecified hypothyroidism     Takes Synthroid daily. Will update TSH.     Anxiety disorder     Takes Ativan 0.5mg  bid.     Hyponatremia     Na 132 05/12/14. Encourage oral intake, repeat BMP in one week.        Review of Systems: Review of Systems  Constitutional: Negative for fever, chills, weight loss, malaise/fatigue and diaphoresis.  HENT: Positive for hearing loss. Negative for congestion, ear discharge, ear pain, nosebleeds, sore throat and tinnitus.   Eyes: Negative for blurred vision, double vision, photophobia, pain, discharge and redness.  Respiratory: Negative for cough, hemoptysis, sputum production, shortness of breath, wheezing and stridor.   Cardiovascular: Negative for chest pain, palpitations, orthopnea, claudication, leg swelling and PND.  Gastrointestinal: Negative for heartburn, nausea, vomiting, abdominal pain, diarrhea, constipation, blood in stool and melena.  Genitourinary: Positive for frequency. Negative for dysuria, urgency, hematuria and flank pain.  Musculoskeletal: Positive for back pain. Negative for falls, joint pain, myalgias and neck pain.       Chronic  Skin: Negative for itching and rash.  Neurological: Positive for sensory change and focal weakness. Negative for dizziness, tingling, tremors, speech change, seizures, loss of consciousness, weakness and headaches.       Numbness and paraesthesia LUE and LLE. Left sided weakness even if grip strength 5/5  Endo/Heme/Allergies: Negative for environmental allergies and polydipsia. Does not bruise/bleed easily.  Psychiatric/Behavioral: Negative  for depression, suicidal ideas, hallucinations, memory loss and substance abuse. The patient is not nervous/anxious and does not have insomnia.      Past Medical History  Diagnosis Date  . Deviated nasal septum   . Dysfunction of eustachian tube   . Unspecified sinusitis (chronic)   . Allergic rhinitis, cause unspecified   . Arthritis   . Cataract   . Osteoporosis   . Hx of syncope     had loop recorder for 2 years now explanted, only PACs noted on device for over 2 years, no a fib, Echo 08/18/09 EF >55%mild concentric LVHnormal myoview, normal carotid dopplers 2010  . Pruritus of skin     poss. related to Losartan  . Dyslipidemia   . Normal cardiac stress test     lexiscan myoview 04/08/08   Past Surgical History  Procedure Laterality Date  . Tonsillectomy    . Total hip arthroplasty      bilateral  . Pelvic sling    . Joint replacement    . Loop recorder implant  07/2009    to eval syncope  . Loop recorder explant  01/12/2012   Social History:   reports that she has never smoked. She does not have any smokeless tobacco history on file. She reports that she does not drink alcohol or use illicit drugs.  Family History  Problem Relation Age of Onset  . Cancer Father     Medications: Patient's Medications  New Prescriptions   No medications on file  Previous Medications   CETIRIZINE (ZYRTEC) 10 MG TABLET    Take 10 mg by mouth daily.   CLOPIDOGREL (PLAVIX) 75 MG TABLET    Take 1 tablet (75 mg total) by mouth daily.   HYDROCODONE-ACETAMINOPHEN (NORCO/VICODIN) 5-325 MG PER TABLET    Take 1 tablet by mouth every 6 (six) hours as needed for moderate pain.   LEVOTHYROXINE (SYNTHROID, LEVOTHROID) 75 MCG TABLET    Take 75 mcg by mouth daily.     LORAZEPAM (ATIVAN) 1 MG TABLET    Take 0.5 tablets (0.5 mg total) by mouth 2 (two) times daily.   MECLIZINE (ANTIVERT) 25 MG TABLET    Take 25 mg by mouth 3 (three) times daily as needed for dizziness.   MONTELUKAST (SINGULAIR) 10 MG  TABLET    Take 10 mg by mouth at bedtime.   SIMVASTATIN (ZOCOR) 20 MG TABLET    Take 1 tablet (20 mg total) by mouth daily at 6 PM.  Modified Medications   No medications on file  Discontinued Medications   No medications on file     Physical Exam:  Physical Exam  Constitutional: She is oriented to person, place, and time. She appears well-developed and well-nourished. No distress.  HENT:  Head: Normocephalic and atraumatic.  Right Ear: External ear normal.  Left Ear: External ear normal.  Nose: Nose normal.  Mouth/Throat: Oropharynx is clear and moist. No oropharyngeal exudate.  Eyes: Conjunctivae and EOM are normal. Pupils are equal, round, and reactive to light. Right eye exhibits no discharge. Left eye exhibits no discharge. No scleral icterus.  Neck: Normal range  of motion. Neck supple. No JVD present. No tracheal deviation present. No thyromegaly present.  Cardiovascular: Normal rate and regular rhythm.  Exam reveals no gallop and no friction rub.   Murmur heard. Pulmonary/Chest: Effort normal and breath sounds normal. No stridor. No respiratory distress. She has no wheezes. She has no rales. She exhibits no tenderness.  Abdominal: Soft. Bowel sounds are normal. She exhibits no distension and no mass. There is no tenderness. There is no rebound and no guarding.  Musculoskeletal: Normal range of motion. She exhibits no edema and no tenderness.  Chronic back pain  Lymphadenopathy:    She has no cervical adenopathy.  Neurological: She is alert and oriented to person, place, and time. She has normal reflexes. She displays normal reflexes. No cranial nerve deficit. She exhibits normal muscle tone. Coordination normal.  Slight weakness LUE and LLE  Skin: Skin is warm and dry. No rash noted. She is not diaphoretic. No erythema. No pallor.  Psychiatric: She has a normal mood and affect. Her behavior is normal. Judgment and thought content normal.    Filed Vitals:   05/12/14 1516    BP: 130/70  Pulse: 60  Temp: 97.1 F (36.2 C)  TempSrc: Tympanic  Resp: 18      Labs reviewed: Basic Metabolic Panel:  Recent Labs  16/07/9606/21/15 0033 05/07/14 1720 05/07/14 2046 05/08/14 1440 05/09/14 0443 05/12/14  NA 137 136* 134*  --  138 132*  K 4.0 4.4 4.2  --  4.0 4.0  CL 100 97 102  --  102  --   CO2  --  23  --   --  23  --   GLUCOSE 104* 98 107*  --  100*  --   BUN 14 13 14   --  12 14  CREATININE 0.80 0.65 0.60  --  0.68 0.7  CALCIUM  --  9.2  --   --  8.6  --   TSH  --   --   --  1.590  --   --    Liver Function Tests: No results found for this basename: AST, ALT, ALKPHOS, BILITOT, PROT, ALBUMIN,  in the last 8760 hours No results found for this basename: LIPASE, AMYLASE,  in the last 8760 hours No results found for this basename: AMMONIA,  in the last 8760 hours CBC:  Recent Labs  05/06/14 0025  05/07/14 1720 05/07/14 2046 05/09/14 0443 05/12/14  WBC 5.5  --  5.4  --  5.1 5.1  HGB 13.3  < > 14.4 14.6 12.4 13.1  HCT 39.0  < > 43.4 43.0 37.6 38  MCV 87.1  --  90.2  --  88.5  --   PLT 224  --  256  --  220 260  < > = values in this interval not displayed. Lipid Panel:  Recent Labs  05/08/14 0540  CHOL 198  HDL 55  LDLCALC 126*  TRIG 86  CHOLHDL 3.6    Past Procedures:  05/06/14 X-ray Pelvis:  IMPRESSION:  Successful reduction of previously noted left hip prosthesis  dislocation. No evidence of fracture or loosening.    05/07/14 CT head and cervical spine w/o contrast:  IMPRESSION:  Probable acute to subacute right occipital infarct without evidence  of hemorrhage. Wallace CullensGray matter involvement is not well demonstrated  raising the question that this could be related to vasogenic edema.  Degenerative changes in the cervical spine without evidence of an  acute fracture.   05/08/14 MRI brain:  IMPRESSION:  1. Acute, moderately large right PCA territory infarct. No  hemorrhage.  2. Fetal type origins of the PCAs with occlusion of the  proximal P2  segment on the right.  3. Intracranial atherosclerosis with severe stenoses of the proximal  basilar artery and left P2 segment and mild to moderate stenoses of  the distal left vertebral artery, left P2 segment, and left MCA  bifurcation.  05/08/14 Echocardiogram:  Study Conclusions  - Left ventricle: The cavity size was normal. Wall thickness was increased in a pattern of moderate LVH. Systolic function was normal. The estimated ejection fraction was in the range of 60% to 65%. - Aortic valve: There was mild to moderate stenosis. There was mild regurgitation - Mitral valve: There was mild regurgitation.   Assessment/Plan Dislocation of hip prosthesis 05/06/14 X-ray Pelvis:  IMPRESSION:  Successful reduction of previously noted left hip prosthesis  dislocation. No evidence of fracture or loosening.  Prn Hydrocodone/ApAp available to her for pain.   Recent dislocation of hip prosthesis on 05/06/2014, reduced under conscious sedation. Dr. Deri Fuelling help.   CVA (cerebral infarction) 05/08/14 MRI brain:  IMPRESSION:  1. Acute, moderately large right PCA territory infarct. No  hemorrhage.  2. Fetal type origins of the PCAs with occlusion of the proximal P2  segment on the right.  3. Intracranial atherosclerosis with severe stenoses of the proximal  basilar artery and left P2 segment and mild to moderate stenoses of  the distal left vertebral artery, left P2 segment, and left MCA  bifurcation.  Follow up with the stroke MD in 2 weeks, Dr. Roda Shutters.   Continue Plavix and Zocor for risk reduction.    Nonallergic rhinitis Takes Zyrtec and Singulair.   Hx of syncope Prn Antivert available to her  Unspecified hypothyroidism Takes Synthroid daily. Will update TSH.   Chronic low back pain Not new. Prn Hydrocodone/ApAp available to her.   Anxiety disorder Takes Ativan 0.5mg  bid.   Hyponatremia Na 132 05/12/14. Encourage oral intake, repeat BMP in one  week.   Dyslipidemia Started statin.     Family/ Staff Communication: observe the patient  Goals of Care: SNF  Labs/tests ordered: CBC, BMP, TSH

## 2014-05-12 NOTE — Assessment & Plan Note (Signed)
Not new. Prn Hydrocodone/ApAp available to her.

## 2014-05-12 NOTE — Assessment & Plan Note (Addendum)
05/08/14 MRI brain:  IMPRESSION:  1. Acute, moderately large right PCA territory infarct. No  hemorrhage.  2. Fetal type origins of the PCAs with occlusion of the proximal P2  segment on the right.  3. Intracranial atherosclerosis with severe stenoses of the proximal  basilar artery and left P2 segment and mild to moderate stenoses of  the distal left vertebral artery, left P2 segment, and left MCA  bifurcation.  Follow up with the stroke MD in 2 weeks, Dr. Roda ShuttersXu.   Continue Plavix and Zocor for risk reduction.

## 2014-05-12 NOTE — Assessment & Plan Note (Signed)
Takes Synthroid 75mcg daily. Will update TSH.   

## 2014-05-12 NOTE — Assessment & Plan Note (Signed)
Takes Zyrtec and Singulair.

## 2014-05-12 NOTE — Assessment & Plan Note (Signed)
Na 132 05/12/14. Encourage oral intake, repeat BMP in one week.

## 2014-05-14 NOTE — Assessment & Plan Note (Signed)
Started statin 

## 2014-05-15 ENCOUNTER — Encounter: Payer: Self-pay | Admitting: Internal Medicine

## 2014-05-15 ENCOUNTER — Telehealth: Payer: Self-pay | Admitting: Neurology

## 2014-05-15 ENCOUNTER — Non-Acute Institutional Stay (SKILLED_NURSING_FACILITY): Payer: Medicare Other | Admitting: Internal Medicine

## 2014-05-15 DIAGNOSIS — E039 Hypothyroidism, unspecified: Secondary | ICD-10-CM

## 2014-05-15 DIAGNOSIS — Z5189 Encounter for other specified aftercare: Secondary | ICD-10-CM

## 2014-05-15 DIAGNOSIS — G8929 Other chronic pain: Secondary | ICD-10-CM

## 2014-05-15 DIAGNOSIS — F411 Generalized anxiety disorder: Secondary | ICD-10-CM

## 2014-05-15 DIAGNOSIS — M545 Low back pain, unspecified: Secondary | ICD-10-CM

## 2014-05-15 DIAGNOSIS — E871 Hypo-osmolality and hyponatremia: Secondary | ICD-10-CM

## 2014-05-15 DIAGNOSIS — Z96649 Presence of unspecified artificial hip joint: Secondary | ICD-10-CM

## 2014-05-15 DIAGNOSIS — T84029D Dislocation of unspecified internal joint prosthesis, subsequent encounter: Secondary | ICD-10-CM

## 2014-05-15 DIAGNOSIS — M47812 Spondylosis without myelopathy or radiculopathy, cervical region: Secondary | ICD-10-CM

## 2014-05-15 DIAGNOSIS — I6322 Cerebral infarction due to unspecified occlusion or stenosis of basilar arteries: Secondary | ICD-10-CM

## 2014-05-15 DIAGNOSIS — G319 Degenerative disease of nervous system, unspecified: Secondary | ICD-10-CM

## 2014-05-15 DIAGNOSIS — F419 Anxiety disorder, unspecified: Secondary | ICD-10-CM

## 2014-05-15 NOTE — Telephone Encounter (Signed)
[name] calling to say disregard previous call, patient will just wait to see Dr Roda ShuttersXu on 06/13/14 as scheduled. If questions she can be reached at [phone] ext [phone].

## 2014-05-15 NOTE — Progress Notes (Signed)
Patient ID: [name], female   DOB: 1922-08-12, 80 y.o.   MRN: [id number]    Location:  [facility]   Place of Service: SNF (31)  Extended Emergency Contact Information Primary Emergency Contact: Cunningham,Jamey Address: [place], [place] [address] [address] of [place] Phone: [phone] Work Phone: [phone] Relation: Son   Risk analyst Complaint  Patient presents with  . Hospitalization Follow-up    HPI:  Patient was admitted to the skilled nursing facility 05/10/49. She was hospitalized 05/07/49 for his/25/15 and his cerebrovascular accident involving the right occipital parietal lobes and resultant left hemiparesis. She also acknowledges a change in her vision. Evaluation of this problem included CT brain scans, MRI brain, and MRA and findings included stenoses of the proximal basilar artery and the left P2 segment. It was mild to moderate stenosis of the distal left vertebral artery, left P2 segment, and left middle cerebral artery bifurcation.  While hospitalized she had a spontaneous dislocation of a left hip prosthesis. This was reduced by orthopedics. She is continuing to wear a long leg splint to avoid crossing her legs. She denies any pain.  Other problems noted included hyperlipidemia, chronic low back pain, hypothyroidism, anxiety, hyponatremia a sodium of 132, osteoporosis, and cervical spine degenerative disease. Cerebral atrophy was noted on her brain scan.   Past Medical History  Diagnosis Date  . Deviated nasal septum   . Dysfunction of eustachian tube   . Unspecified sinusitis (chronic)   . Allergic rhinitis, cause unspecified   . Arthritis   . Cataract   . Senile osteoporosis   . Hx of syncope     had loop recorder for 2 years now explanted, only PACs noted on device for over 2 years, no a fib, Echo 08/18/09 EF >55%mild concentric LVHnormal myoview, normal carotid dopplers 2010  . Pruritus of skin     poss. related  to Losartan  . Hyperlipidemia   . Normal cardiac stress test     lexiscan myoview 04/08/08  . CVA (cerebral infarction) 2015  . Cerebral atrophy 2015  . Cerebrovascular disease 2015  . Dislocation of hip prosthesis 2015  . Lumbago 2015  . Unspecified hypothyroidism 2015  . Anxiety 2015  . Hyponatremia 2015  . Cervical spine degeneration 2015    Past Surgical History  Procedure Laterality Date  . Tonsillectomy    . Total hip arthroplasty      bilateral  . Pelvic sling    . Joint replacement    . Loop recorder implant  07/2009    to eval syncope  . Loop recorder explant  01/12/2012    History   Social History  . Marital Status: Widowed    Spouse Name: N/A    Number of Children: N/A  . Years of Education: N/A   Occupational History  . Not on file.   Social History Main Topics  . Smoking status: Never Smoker   . Smokeless tobacco: Not on file  . Alcohol Use: No  . Drug Use: No  . Sexual Activity: No   Other Topics Concern  . Not on file   Social History Narrative  . No narrative on file     reports that she has never smoked. She does not have any smokeless tobacco history on file. She reports that she does not drink alcohol or use illicit drugs.  Immunization History  Administered Date(s) Administered  . Influenza Split 07/18/2011  .  Influenza Whole 07/17/2010, 07/17/2012  . Influenza, High Dose Seasonal PF 07/25/2013    Allergies  Allergen Reactions  . Augmentin [Amoxicillin-Pot Clavulanate] Nausea Only    Medications: Patient's Medications  New Prescriptions   No medications on file  Previous Medications   CETIRIZINE (ZYRTEC) 10 MG TABLET    Take 10 mg by mouth daily.   CLOPIDOGREL (PLAVIX) 75 MG TABLET    Take 1 tablet (75 mg total) by mouth daily.   HYDROCODONE-ACETAMINOPHEN (NORCO/VICODIN) 5-325 MG PER TABLET    Take 1 tablet by mouth every 6 (six) hours as needed for moderate pain.   LEVOTHYROXINE (SYNTHROID, LEVOTHROID) 75 MCG TABLET    Take  75 mcg by mouth daily.     LORAZEPAM (ATIVAN) 1 MG TABLET    Take 0.5 tablets (0.5 mg total) by mouth 2 (two) times daily.   MECLIZINE (ANTIVERT) 25 MG TABLET    Take 25 mg by mouth 3 (three) times daily as needed for dizziness.   MONTELUKAST (SINGULAIR) 10 MG TABLET    Take 10 mg by mouth at bedtime.   SIMVASTATIN (ZOCOR) 20 MG TABLET    Take 1 tablet (20 mg total) by mouth daily at 6 PM.  Modified Medications   No medications on file  Discontinued Medications   No medications on file     Review of Systems  Constitutional: Positive for activity change and fatigue. Negative for fever, diaphoresis and unexpected weight change.  HENT: Negative for ear pain, hearing loss, postnasal drip, rhinorrhea, sinus pressure, trouble swallowing and voice change.   Eyes: Negative.   Respiratory: Negative for cough, choking, chest tightness and shortness of breath.   Cardiovascular: Negative for chest pain, palpitations and leg swelling.  Gastrointestinal: Positive for constipation. Negative for nausea, abdominal pain, diarrhea, blood in stool, abdominal distention and rectal pain.  Endocrine:       Hypothyroid  Genitourinary: Negative for dysuria, frequency, flank pain and difficulty urinating.  Musculoskeletal: Positive for arthralgias, back pain, gait problem, neck pain and neck stiffness. Negative for joint swelling and myalgias.  Skin: Negative.   Allergic/Immunologic: Negative.   Neurological: Positive for weakness (Mild left-sided hemiparesis). Negative for dizziness, tremors, seizures, facial asymmetry, speech difficulty, light-headedness, numbness and headaches.  Hematological: Negative.   Psychiatric/Behavioral: Negative.     Filed Vitals:   05/15/14 1233  BP: 134/60  Pulse: 70  Temp: 97.6 F (36.4 C)  Resp: 20  Height: 5' 4"  (1.626 m)  Weight: 150 lb (68.04 kg)   Body mass index is 25.73 kg/(m^2).  Physical Exam  Constitutional: She is oriented to person, place, and time. No  distress.  Frail  HENT:  Head: Normocephalic.  Right Ear: External ear normal.  Left Ear: External ear normal.  Mouth/Throat: Oropharynx is clear and moist. No oropharyngeal exudate.  Eyes: Conjunctivae and EOM are normal. Pupils are equal, round, and reactive to light.  Neck: No JVD present. No tracheal deviation present. No thyromegaly present.  Cardiovascular: Normal rate, regular rhythm, normal heart sounds and intact distal pulses.  Exam reveals no gallop and no friction rub.   No murmur heard. Pulmonary/Chest: No respiratory distress. She has no wheezes. She has no rales.  Abdominal: She exhibits no distension and no mass. There is no tenderness. There is no rebound.  Musculoskeletal: Normal range of motion. She exhibits tenderness (Left hip). She exhibits no edema.  Long leg brace on the left leg  Lymphadenopathy:    She has no cervical adenopathy.  Neurological: She is  alert and oriented to person, place, and time. She has normal reflexes. No cranial nerve deficit. Coordination normal.  Mild left hemiparesis  Skin: No rash noted. No erythema. No pallor.  Psychiatric: She has a normal mood and affect. Her behavior is normal. Judgment and thought content normal.     Labs reviewed: Nursing Home on 05/12/2014  Component Date Value Ref Range Status  . Hemoglobin 05/12/2014 13.1  12.0 - 16.0 g/dL Final  . HCT 05/12/2014 38  36 - 46 % Final  . Platelets 05/12/2014 260  150 - 399 K/L Final  . WBC 05/12/2014 5.1   Final  . Glucose 05/12/2014 98   Final  . BUN 05/12/2014 14  4 - 21 mg/dL Final  . Creatinine 05/12/2014 0.7  0.5 - 1.1 mg/dL Final  . Potassium 05/12/2014 4.0  3.4 - 5.3 mmol/L Final  . Sodium 05/12/2014 132* 137 - 147 mmol/L Final  Admission on 05/07/2014, Discharged on 05/10/2014  Component Date Value Ref Range Status  . WBC 05/07/2014 5.4  4.0 - 10.5 K/uL Final  . RBC 05/07/2014 4.81  3.87 - 5.11 MIL/uL Final  . Hemoglobin 05/07/2014 14.4  12.0 - 15.0 g/dL Final    . HCT 05/07/2014 43.4  36.0 - 46.0 % Final  . MCV 05/07/2014 90.2  78.0 - 100.0 fL Final  . MCH 05/07/2014 29.9  26.0 - 34.0 pg Final  . MCHC 05/07/2014 33.2  30.0 - 36.0 g/dL Final  . RDW 05/07/2014 13.0  11.5 - 15.5 % Final  . Platelets 05/07/2014 256  150 - 400 K/uL Final  . Sodium 05/07/2014 136* 137 - 147 mEq/L Final  . Potassium 05/07/2014 4.4  3.7 - 5.3 mEq/L Final  . Chloride 05/07/2014 97  96 - 112 mEq/L Final  . CO2 05/07/2014 23  19 - 32 mEq/L Final  . Glucose, Bld 05/07/2014 98  70 - 99 mg/dL Final  . BUN 05/07/2014 13  6 - 23 mg/dL Final  . Creatinine, Ser 05/07/2014 0.65  0.50 - 1.10 mg/dL Final  . Calcium 05/07/2014 9.2  8.4 - 10.5 mg/dL Final  . GFR calc non Af Amer 05/07/2014 76* >90 mL/min Final  . GFR calc Af Amer 05/07/2014 88* >90 mL/min Final   Comment: (NOTE)                          The eGFR has been calculated using the CKD EPI equation.                          This calculation has not been validated in all clinical situations.                          eGFR's persistently <90 mL/min signify possible Chronic Kidney                          Disease.  . Anion gap 05/07/2014 16* 5 - 15 Final  . Sodium 05/07/2014 134* 137 - 147 mEq/L Final  . Potassium 05/07/2014 4.2  3.7 - 5.3 mEq/L Final  . Chloride 05/07/2014 102  96 - 112 mEq/L Final  . BUN 05/07/2014 14  6 - 23 mg/dL Final  . Creatinine, Ser 05/07/2014 0.60  0.50 - 1.10 mg/dL Final  . Glucose, Bld 05/07/2014 107* 70 - 99 mg/dL Final  . Calcium, Ion 05/07/2014 1.09*  1.13 - 1.30 mmol/L Final  . TCO2 05/07/2014 25  0 - 100 mmol/L Final  . Hemoglobin 05/07/2014 14.6  12.0 - 15.0 g/dL Final  . HCT 05/07/2014 43.0  36.0 - 46.0 % Final  . Hemoglobin A1C 05/08/2014 5.8* <5.7 % Final   Comment: (NOTE)                                                                                                                         According to the ADA Clinical Practice Recommendations for 2011, when                           HbA1c is used as a screening test:                           >=6.5%   Diagnostic of Diabetes Mellitus                                    (if abnormal result is confirmed)                          5.7-6.4%   Increased risk of developing Diabetes Mellitus                          References:Diagnosis and Classification of Diabetes Mellitus,Diabetes                          UUEK,8003,49(ZPHXT 1):S62-S69 and Standards of Medical Care in                                  Diabetes - 2011,Diabetes Care,2011,34 (Suppl 1):S11-S61.  . Mean Plasma Glucose 05/08/2014 120* <117 mg/dL Final   Performed at [facility]  . Cholesterol 05/08/2014 198  0 - 200 mg/dL Final  . Triglycerides 05/08/2014 86  <150 mg/dL Final  . HDL 05/08/2014 55  >39 mg/dL Final  . Total CHOL/HDL Ratio 05/08/2014 3.6   Final  . VLDL 05/08/2014 17  0 - 40 mg/dL Final  . LDL Cholesterol 05/08/2014 126* 0 - 99 mg/dL Final   Comment:                                 Total Cholesterol/HDL:CHD Risk                          Coronary Heart Disease Risk Table  Men   Women                           1/2 Average Risk   3.4   3.3                           Average Risk       5.0   4.4                           2 X Average Risk   9.6   7.1                           3 X Average Risk  23.4   11.0                                                          Use the calculated Patient Ratio                          above and the CHD Risk Table                          to determine the patient's CHD Risk.                                                          ATP III CLASSIFICATION (LDL):                           <100     mg/dL   Optimal                           100-129  mg/dL   Near or Above                                             Optimal                           130-159  mg/dL   Borderline                           160-189  mg/dL   High                           >190     mg/dL   Very  High  . TSH 05/08/2014 1.590  0.350 - 4.500 uIU/mL Final  . Sodium 05/09/2014 138  137 - 147 mEq/L Final  . Potassium 05/09/2014 4.0  3.7 - 5.3 mEq/L Final  . Chloride 05/09/2014 102  96 - 112 mEq/L Final  . CO2 05/09/2014 23  19 -  32 mEq/L Final  . Glucose, Bld 05/09/2014 100* 70 - 99 mg/dL Final  . BUN 05/09/2014 12  6 - 23 mg/dL Final  . Creatinine, Ser 05/09/2014 0.68  0.50 - 1.10 mg/dL Final  . Calcium 05/09/2014 8.6  8.4 - 10.5 mg/dL Final  . GFR calc non Af Amer 05/09/2014 75* >90 mL/min Final  . GFR calc Af Amer 05/09/2014 87* >90 mL/min Final   Comment: (NOTE)                          The eGFR has been calculated using the CKD EPI equation.                          This calculation has not been validated in all clinical situations.                          eGFR's persistently <90 mL/min signify possible Chronic Kidney                          Disease.  . Anion gap 05/09/2014 13  5 - 15 Final  . WBC 05/09/2014 5.1  4.0 - 10.5 K/uL Final  . RBC 05/09/2014 4.25  3.87 - 5.11 MIL/uL Final  . Hemoglobin 05/09/2014 12.4  12.0 - 15.0 g/dL Final  . HCT 05/09/2014 37.6  36.0 - 46.0 % Final  . MCV 05/09/2014 88.5  78.0 - 100.0 fL Final  . MCH 05/09/2014 29.2  26.0 - 34.0 pg Final  . MCHC 05/09/2014 33.0  30.0 - 36.0 g/dL Final  . RDW 05/09/2014 13.1  11.5 - 15.5 % Final  . Platelets 05/09/2014 220  150 - 400 K/uL Final  Admission on 05/06/2014, Discharged on 05/06/2014  Component Date Value Ref Range Status  . WBC 05/06/2014 5.5  4.0 - 10.5 K/uL Final  . RBC 05/06/2014 4.48  3.87 - 5.11 MIL/uL Final  . Hemoglobin 05/06/2014 13.3  12.0 - 15.0 g/dL Final  . HCT 05/06/2014 39.0  36.0 - 46.0 % Final  . MCV 05/06/2014 87.1  78.0 - 100.0 fL Final  . MCH 05/06/2014 29.7  26.0 - 34.0 pg Final  . MCHC 05/06/2014 34.1  30.0 - 36.0 g/dL Final  . RDW 05/06/2014 12.7  11.5 - 15.5 % Final  . Platelets 05/06/2014 224  150 - 400 K/uL Final  . Sodium 05/06/2014 137  137 - 147 mEq/L Final  .  Potassium 05/06/2014 4.0  3.7 - 5.3 mEq/L Final  . Chloride 05/06/2014 100  96 - 112 mEq/L Final  . BUN 05/06/2014 14  6 - 23 mg/dL Final  . Creatinine, Ser 05/06/2014 0.80  0.50 - 1.10 mg/dL Final  . Glucose, Bld 05/06/2014 104* 70 - 99 mg/dL Final  . Calcium, Ion 05/06/2014 1.17  1.13 - 1.30 mmol/L Final  . TCO2 05/06/2014 21  0 - 100 mmol/L Final  . Hemoglobin 05/06/2014 13.9  12.0 - 15.0 g/dL Final  . HCT 05/06/2014 41.0  36.0 - 46.0 % Final      Assessment/Plan  1. 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 Has already been some return of function in the left side. Patient says her vision is not the same. Occipital lobe involvement could explain this.   Plan: Physical therapy and occupational therapy. Hopefully this will be a short-term rehabilitation stay with  shortness of function in the left side. In the long run, she will need ophthalmologic examination because of the change in vision.  The "early return of left-sided function" is encouraging.  2. Cerebral atrophy Incidental finding on brain scans  3. Anxiety Controlled  4. Cervical spine degeneration Chronic mild neck discomfort and stiffness  5. Dislocation of hip prosthesis, subsequent encounter Chest pain or discomfort. Continues to wear a long-leg splint of the left leg recurrence.  6. Hyponatremia Corrected  7. Unspecified hypothyroidism Compensated  8. Chronic low back pain Controlled

## 2014-05-15 NOTE — Telephone Encounter (Signed)
[name] HeckDanielle with [facility] [phone], calling requesting an earlier appt with Dr. Roda ShuttersXu (8/28).  Patient complaining of arm and foot numbness this am.  Please call and advise.  Thanks

## 2014-05-15 NOTE — Telephone Encounter (Signed)
I called [name], and LMVM for her to return call.  She had [facility].  Pt of Dr. Roda ShuttersXu.

## 2014-05-19 ENCOUNTER — Encounter: Payer: Self-pay | Admitting: Nurse Practitioner

## 2014-05-19 ENCOUNTER — Non-Acute Institutional Stay (SKILLED_NURSING_FACILITY): Payer: Medicare Other | Admitting: Nurse Practitioner

## 2014-05-19 DIAGNOSIS — G8929 Other chronic pain: Secondary | ICD-10-CM

## 2014-05-19 DIAGNOSIS — M545 Other chronic pain: Secondary | ICD-10-CM

## 2014-05-19 DIAGNOSIS — F419 Anxiety disorder, unspecified: Secondary | ICD-10-CM

## 2014-05-19 DIAGNOSIS — E039 Hypothyroidism, unspecified: Secondary | ICD-10-CM

## 2014-05-19 DIAGNOSIS — H538 Other visual disturbances: Secondary | ICD-10-CM

## 2014-05-19 DIAGNOSIS — F411 Generalized anxiety disorder: Secondary | ICD-10-CM

## 2014-05-19 HISTORY — DX: Other visual disturbances: H53.8

## 2014-05-19 NOTE — Assessment & Plan Note (Signed)
Takes Synthroid daily. Will update TSH.

## 2014-05-19 NOTE — Assessment & Plan Note (Signed)
C/o LUE and LLE numbness. Muscle strength 5/5  05/08/14 MRI brain:  IMPRESSION:  1. Acute, moderately large right PCA territory infarct. No  hemorrhage.  2. Fetal type origins of the PCAs with occlusion of the proximal P2  segment on the right.  3. Intracranial atherosclerosis with severe stenoses of the proximal  basilar artery and left P2 segment and mild to moderate stenoses of  the distal left vertebral artery, left P2 segment, and left MCA  bifurcation.  Follow up with the stroke MD in 2 weeks, Dr. Roda ShuttersXu.   Continue Plavix and Zocor for risk reduction.

## 2014-05-19 NOTE — Assessment & Plan Note (Signed)
Takes Ativan 0.5mg bid.    

## 2014-05-19 NOTE — Progress Notes (Signed)
Patient ID: [name] KinsMarjorie D [name], female   DOB: [birth year], 80 y.o.   MRN: [id number]   Code Status: DNR  Allergies  Allergen Reactions  . Augmentin [Amoxicillin-Pot Clavulanate] Nausea Only    Chief Complaint  Patient presents with  . Medical Management of Chronic Issues  . Acute Visit    c/o left eye decreased vision.     HPI: Patient is a 80 y.o. female [facility] for evaluation of  C/o decreased left eye vision, CVA with left sided weakness and numbness/paresthesias,  and other chronic medical conditions.   Hospitalized from 05/07/2014-05/10/2014  For CVA (cerebral infarction) with mild left sided weakness and Dislocation of the left prosthesis-spontaneous dislocation of her left hip was reduced by orthopedics and anesthesia.  Head CT obtained shows probable acute to subacute right occipital infarct without evidence of hemorrhage however no significant grave matter of all meds with a question of vasogenic edema. Degenerative changes were noted on the cervical spine. Hip x-ray within normal limits.   Problem List Items Addressed This Visit   Chronic low back pain     Not new. Prn Hydrocodone/ApAp available to her.      Unspecified hypothyroidism     Takes Synthroid 75mcg daily. Will update TSH.      Anxiety     Takes Ativan 0.5mg  bid.      Blurred vision, left eye - Primary     Ophthalmology consult.        Review of Systems: Review of Systems  Constitutional: Negative for fever, chills, weight loss, malaise/fatigue and diaphoresis.  HENT: Positive for hearing loss. Negative for congestion, ear discharge, ear pain, nosebleeds, sore throat and tinnitus.   Eyes: Positive for blurred vision. Negative for double vision, photophobia, pain, discharge and redness.       Left eye  Respiratory: Negative for cough, hemoptysis, sputum production, shortness of breath, wheezing and stridor.   Cardiovascular: Negative for chest pain, palpitations,  orthopnea, claudication, leg swelling and PND.  Gastrointestinal: Negative for heartburn, nausea, vomiting, abdominal pain, diarrhea, constipation, blood in stool and melena.  Genitourinary: Positive for frequency. Negative for dysuria, urgency, hematuria and flank pain.  Musculoskeletal: Positive for back pain. Negative for falls, joint pain, myalgias and neck pain.       Chronic  Skin: Negative for itching and rash.  Neurological: Positive for sensory change and focal weakness. Negative for dizziness, tingling, tremors, speech change, seizures, loss of consciousness, weakness and headaches.       Numbness and paraesthesia LUE and LLE. Left sided weakness even if grip strength 5/5  Endo/Heme/Allergies: Negative for environmental allergies and polydipsia. Does not bruise/bleed easily.  Psychiatric/Behavioral: Negative for depression, suicidal ideas, hallucinations, memory loss and substance abuse. The patient is not nervous/anxious and does not have insomnia.      Past Medical History  Diagnosis Date  . Deviated nasal septum   . Dysfunction of eustachian tube   . Unspecified sinusitis (chronic)   . Allergic rhinitis, cause unspecified   . Arthritis   . Cataract   . Senile osteoporosis   . Hx of syncope     had loop recorder for 2 years now explanted, only PACs noted on device for over 2 years, no a fib, Echo 08/18/09 EF >55%mild concentric LVHnormal myoview, normal carotid dopplers 2010  . Pruritus of skin     poss. related to Losartan  . Hyperlipidemia   . Normal cardiac stress test  lexiscan myoview 04/08/08  . CVA (cerebral infarction) 2015  . Cerebral atrophy 2015  . Cerebrovascular disease 2015  . Dislocation of hip prosthesis 2015  . Lumbago 2015  . Unspecified hypothyroidism 2015  . Anxiety 2015  . Hyponatremia 2015  . Cervical spine degeneration 2015   Past Surgical History  Procedure Laterality Date  . Tonsillectomy    . Total hip arthroplasty      bilateral  .  Pelvic sling    . Joint replacement    . Loop recorder implant  07/2009    to eval syncope  . Loop recorder explant  01/12/2012   Social History:   reports that she has never smoked. She does not have any smokeless tobacco history on file. She reports that she does not drink alcohol or use illicit drugs.  Family History  Problem Relation Age of Onset  . Cancer Father     Medications: Patient's Medications  New Prescriptions   No medications on file  Previous Medications   CETIRIZINE (ZYRTEC) 10 MG TABLET    Take 10 mg by mouth daily.   CLOPIDOGREL (PLAVIX) 75 MG TABLET    Take 1 tablet (75 mg total) by mouth daily.   HYDROCODONE-ACETAMINOPHEN (NORCO/VICODIN) 5-325 MG PER TABLET    Take 1 tablet by mouth every 6 (six) hours as needed for moderate pain.   LEVOTHYROXINE (SYNTHROID, LEVOTHROID) 75 MCG TABLET    Take 75 mcg by mouth daily.     LORAZEPAM (ATIVAN) 1 MG TABLET    Take 0.5 tablets (0.5 mg total) by mouth 2 (two) times daily.   MECLIZINE (ANTIVERT) 25 MG TABLET    Take 25 mg by mouth 3 (three) times daily as needed for dizziness.   MONTELUKAST (SINGULAIR) 10 MG TABLET    Take 10 mg by mouth at bedtime.   SIMVASTATIN (ZOCOR) 20 MG TABLET    Take 1 tablet (20 mg total) by mouth daily at 6 PM.  Modified Medications   No medications on file  Discontinued Medications   No medications on file     Physical Exam:  Physical Exam  Constitutional: She is oriented to person, place, and time. She appears well-developed and well-nourished. No distress.  HENT:  Head: Normocephalic and atraumatic.  Right Ear: External ear normal.  Left Ear: External ear normal.  Nose: Nose normal.  Mouth/Throat: Oropharynx is clear and moist. No oropharyngeal exudate.  Eyes: Conjunctivae and EOM are normal. Pupils are equal, round, and reactive to light. Right eye exhibits no discharge. Left eye exhibits no discharge. No scleral icterus.  Neck: Normal range of motion. Neck supple. No JVD present. No  tracheal deviation present. No thyromegaly present.  Cardiovascular: Normal rate and regular rhythm.  Exam reveals no gallop and no friction rub.   Murmur heard. Pulmonary/Chest: Effort normal and breath sounds normal. No stridor. No respiratory distress. She has no wheezes. She has no rales. She exhibits no tenderness.  Abdominal: Soft. Bowel sounds are normal. She exhibits no distension and no mass. There is no tenderness. There is no rebound and no guarding.  Musculoskeletal: Normal range of motion. She exhibits no edema and no tenderness.  Chronic back pain  Lymphadenopathy:    She has no cervical adenopathy.  Neurological: She is alert and oriented to person, place, and time. She has normal reflexes. No cranial nerve deficit. She exhibits normal muscle tone. Coordination normal.  Slight weakness LUE and LLE  Skin: Skin is warm and dry. No rash noted. She  is not diaphoretic. No erythema. No pallor.  Psychiatric: She has a normal mood and affect. Her behavior is normal. Judgment and thought content normal.    Filed Vitals:   05/19/14 1649  BP: 114/80  Pulse: 64  Temp: 98.3 F (36.8 C)  TempSrc: Tympanic  Resp: 16      Labs reviewed: Basic Metabolic Panel:  Recent Labs  45/40/98 0033 05/07/14 1720 05/07/14 2046 05/08/14 1440 05/09/14 0443 05/12/14  NA 137 136* 134*  --  138 132*  K 4.0 4.4 4.2  --  4.0 4.0  CL 100 97 102  --  102  --   CO2  --  23  --   --  23  --   GLUCOSE 104* 98 107*  --  100*  --   BUN 14 13 14   --  12 14  CREATININE 0.80 0.65 0.60  --  0.68 0.7  CALCIUM  --  9.2  --   --  8.6  --   TSH  --   --   --  1.590  --   --    Liver Function Tests: No results found for this basename: AST, ALT, ALKPHOS, BILITOT, PROT, ALBUMIN,  in the last 8760 hours No results found for this basename: LIPASE, AMYLASE,  in the last 8760 hours No results found for this basename: AMMONIA,  in the last 8760 hours CBC:  Recent Labs  05/06/14 0025  05/07/14 1720  05/07/14 2046 05/09/14 0443 05/12/14  WBC 5.5  --  5.4  --  5.1 5.1  HGB 13.3  < > 14.4 14.6 12.4 13.1  HCT 39.0  < > 43.4 43.0 37.6 38  MCV 87.1  --  90.2  --  88.5  --   PLT 224  --  256  --  220 260  < > = values in this interval not displayed. Lipid Panel:  Recent Labs  05/08/14 0540  CHOL 198  HDL 55  LDLCALC 126*  TRIG 86  CHOLHDL 3.6    Past Procedures:  05/06/14 X-ray Pelvis:  IMPRESSION:  Successful reduction of previously noted left hip prosthesis  dislocation. No evidence of fracture or loosening.    05/07/14 CT head and cervical spine w/o contrast:  IMPRESSION:  Probable acute to subacute right occipital infarct without evidence  of hemorrhage. Wallace Cullens matter involvement is not well demonstrated  raising the question that this could be related to vasogenic edema.  Degenerative changes in the cervical spine without evidence of an  acute fracture.   05/08/14 MRI brain:  IMPRESSION:  1. Acute, moderately large right PCA territory infarct. No  hemorrhage.  2. Fetal type origins of the PCAs with occlusion of the proximal P2  segment on the right.  3. Intracranial atherosclerosis with severe stenoses of the proximal  basilar artery and left P2 segment and mild to moderate stenoses of  the distal left vertebral artery, left P2 segment, and left MCA  bifurcation.  05/08/14 Echocardiogram:  Study Conclusions  - Left ventricle: The cavity size was normal. Wall thickness was increased in a pattern of moderate LVH. Systolic function was normal. The estimated ejection fraction was in the range of 60% to 65%. - Aortic valve: There was mild to moderate stenosis. There was mild regurgitation - Mitral valve: There was mild regurgitation.   Assessment/Plan Blurred vision, left eye Ophthalmology consult.   Unspecified hypothyroidism Takes Synthroid daily. Will update TSH.    CVA (cerebral infarction) C/o LUE and  LLE numbness. Muscle strength  5/5  05/08/14 MRI brain:  IMPRESSION:  1. Acute, moderately large right PCA territory infarct. No  hemorrhage.  2. Fetal type origins of the PCAs with occlusion of the proximal P2  segment on the right.  3. Intracranial atherosclerosis with severe stenoses of the proximal  basilar artery and left P2 segment and mild to moderate stenoses of  the distal left vertebral artery, left P2 segment, and left MCA  bifurcation.  Follow up with the stroke MD in 2 weeks, Dr. Roda Shutters.   Continue Plavix and Zocor for risk reduction.     Anxiety Takes Ativan 0.5mg  bid.    Chronic low back pain Not new. Prn Hydrocodone/ApAp available to her.      Family/ Staff Communication: observe the patient  Goals of Care: SNF  Labs/tests ordered: none

## 2014-05-19 NOTE — Assessment & Plan Note (Signed)
Not new. Prn Hydrocodone/ApAp available to her.

## 2014-05-19 NOTE — Assessment & Plan Note (Signed)
Ophthalmology consult

## 2014-05-20 ENCOUNTER — Encounter: Payer: Self-pay | Admitting: Neurology

## 2014-05-20 LAB — BASIC METABOLIC PANEL
BUN: 12 mg/dL (ref 4–21)
Creatinine: 0.7 mg/dL (ref 0.5–1.1)
Glucose: 87 mg/dL
Potassium: 4.2 mmol/L (ref 3.4–5.3)
Sodium: 139 mmol/L (ref 137–147)

## 2014-05-20 LAB — TSH: TSH: 0.24 u[IU]/mL — AB (ref 0.41–5.90)

## 2014-05-21 ENCOUNTER — Encounter: Payer: Self-pay | Admitting: Nurse Practitioner

## 2014-05-21 ENCOUNTER — Non-Acute Institutional Stay (SKILLED_NURSING_FACILITY): Payer: Medicare Other | Admitting: Nurse Practitioner

## 2014-05-21 DIAGNOSIS — M545 Low back pain, unspecified: Secondary | ICD-10-CM

## 2014-05-21 DIAGNOSIS — F419 Anxiety disorder, unspecified: Secondary | ICD-10-CM

## 2014-05-21 DIAGNOSIS — E871 Hypo-osmolality and hyponatremia: Secondary | ICD-10-CM

## 2014-05-21 DIAGNOSIS — E039 Hypothyroidism, unspecified: Secondary | ICD-10-CM

## 2014-05-21 DIAGNOSIS — G8929 Other chronic pain: Secondary | ICD-10-CM

## 2014-05-21 DIAGNOSIS — F411 Generalized anxiety disorder: Secondary | ICD-10-CM

## 2014-05-21 NOTE — Assessment & Plan Note (Signed)
Resolved, Na 139 05/20/14

## 2014-05-21 NOTE — Progress Notes (Signed)
Patient ID: [name], female   DOB: 01-11-1922, 80 y.o.   MRN: [id number]   Code Status: DNR  Allergies  Allergen Reactions  . Augmentin [Amoxicillin-Pot Clavulanate] Nausea Only    Chief Complaint  Patient presents with  . Medical Management of Chronic Issues  . Acute Visit    thyroid    HPI: Patient is a 80 y.o. female seen in the SNF at [facility] today for evaluation of thyroid, CVA with left sided weakness and numbness/paresthesias,  and other chronic medical conditions.   Hospitalized from 05/07/2014-05/10/2014  For CVA (cerebral infarction) with mild left sided weakness and Dislocation of the left prosthesis-spontaneous dislocation of her left hip was reduced by orthopedics and anesthesia.  Head CT obtained shows probable acute to subacute right occipital infarct without evidence of hemorrhage however no significant grave matter of all meds with a question of vasogenic edema. Degenerative changes were noted on the cervical spine. Hip x-ray within normal limits.   Problem List Items Addressed This Visit   Chronic low back pain - Primary     Not new. Change Hydrocodone/ApAp 5/325mg  bid for better pain control instead of prn.       Unspecified hypothyroidism     TSH 0.245 05/20/14, decrease Levothyroxine to from , f/u TSH in 6 wks.     Relevant Medications      levothyroxine (SYNTHROID, LEVOTHROID) 50 MCG tablet   Hyponatremia     Resolved, Na 139 05/20/14    Anxiety     Takes Ativan 0.5mg  bid.          Review of Systems: Review of Systems  Constitutional: Negative for fever, chills, weight loss, malaise/fatigue and diaphoresis.  HENT: Positive for hearing loss. Negative for congestion, ear discharge, ear pain, nosebleeds, sore throat and tinnitus.   Eyes: Positive for blurred vision. Negative for double vision, photophobia, pain, discharge and redness.       Left eye  Respiratory: Negative for cough, hemoptysis, sputum production, shortness  of breath, wheezing and stridor.   Cardiovascular: Negative for chest pain, palpitations, orthopnea, claudication, leg swelling and PND.  Gastrointestinal: Negative for heartburn, nausea, vomiting, abdominal pain, diarrhea, constipation, blood in stool and melena.  Genitourinary: Positive for frequency. Negative for dysuria, urgency, hematuria and flank pain.  Musculoskeletal: Positive for back pain. Negative for falls, joint pain, myalgias and neck pain.       Chronic  Skin: Negative for itching and rash.  Neurological: Positive for sensory change and focal weakness. Negative for dizziness, tingling, tremors, speech change, seizures, loss of consciousness, weakness and headaches.       Numbness and paraesthesia LUE and LLE. Left sided weakness even if grip strength 5/5  Endo/Heme/Allergies: Negative for environmental allergies and polydipsia. Does not bruise/bleed easily.  Psychiatric/Behavioral: Negative for depression, suicidal ideas, hallucinations, memory loss and substance abuse. The patient is not nervous/anxious and does not have insomnia.      Past Medical History  Diagnosis Date  . Deviated nasal septum   . Dysfunction of eustachian tube   . Unspecified sinusitis (chronic)   . Allergic rhinitis, cause unspecified   . Arthritis   . Cataract   . Senile osteoporosis   . Hx of syncope     had loop recorder for 2 years now explanted, only PACs noted on device for over 2 years, no a fib, Echo 08/18/09 EF >55%mild concentric LVHnormal myoview, normal carotid dopplers 2010  . Pruritus of skin     poss.  related to Losartan  . Hyperlipidemia   . Normal cardiac stress test     lexiscan myoview 04/08/08  . CVA (cerebral infarction) 2015  . Cerebral atrophy 2015  . Cerebrovascular disease 2015  . Dislocation of hip prosthesis 2015  . Lumbago 2015  . Unspecified hypothyroidism 2015  . Anxiety 2015  . Hyponatremia 2015  . Cervical spine degeneration 2015   Past Surgical History    Procedure Laterality Date  . Tonsillectomy    . Total hip arthroplasty      bilateral  . Pelvic sling    . Joint replacement    . Loop recorder implant  07/2009    to eval syncope  . Loop recorder explant  01/12/2012   Social History:   reports that she has never smoked. She does not have any smokeless tobacco history on file. She reports that she does not drink alcohol or use illicit drugs.  Family History  Problem Relation Age of Onset  . Cancer Father     Medications: Patient's Medications  New Prescriptions   No medications on file  Previous Medications   CETIRIZINE (ZYRTEC) 10 MG TABLET    Take 10 mg by mouth daily.   CLOPIDOGREL (PLAVIX) 75 MG TABLET    Take 1 tablet (75 mg total) by mouth daily.   HYDROCODONE-ACETAMINOPHEN (NORCO/VICODIN) 5-325 MG PER TABLET    Take 1 tablet by mouth every 6 (six) hours as needed for moderate pain.   LEVOTHYROXINE (SYNTHROID, LEVOTHROID) 50 MCG TABLET    Take 50 mcg by mouth daily before breakfast.   LORAZEPAM (ATIVAN) 1 MG TABLET    Take 0.5 tablets (0.5 mg total) by mouth 2 (two) times daily.   MECLIZINE (ANTIVERT) 25 MG TABLET    Take 25 mg by mouth 3 (three) times daily as needed for dizziness.   MONTELUKAST (SINGULAIR) 10 MG TABLET    Take 10 mg by mouth at bedtime.   SIMVASTATIN (ZOCOR) 20 MG TABLET    Take 1 tablet (20 mg total) by mouth daily at 6 PM.  Modified Medications   No medications on file  Discontinued Medications   LEVOTHYROXINE (SYNTHROID, LEVOTHROID) 75 MCG TABLET    Take 75 mcg by mouth daily.       Physical Exam:  Physical Exam  Constitutional: She is oriented to person, place, and time. She appears well-developed and well-nourished. No distress.  HENT:  Head: Normocephalic and atraumatic.  Right Ear: External ear normal.  Left Ear: External ear normal.  Nose: Nose normal.  Mouth/Throat: Oropharynx is clear and moist. No oropharyngeal exudate.  Eyes: Conjunctivae and EOM are normal. Pupils are equal, round,  and reactive to light. Right eye exhibits no discharge. Left eye exhibits no discharge. No scleral icterus.  Neck: Normal range of motion. Neck supple. No JVD present. No tracheal deviation present. No thyromegaly present.  Cardiovascular: Normal rate and regular rhythm.  Exam reveals no gallop and no friction rub.   Murmur heard. Pulmonary/Chest: Effort normal and breath sounds normal. No stridor. No respiratory distress. She has no wheezes. She has no rales. She exhibits no tenderness.  Abdominal: Soft. Bowel sounds are normal. She exhibits no distension and no mass. There is no tenderness. There is no rebound and no guarding.  Musculoskeletal: Normal range of motion. She exhibits no edema and no tenderness.  Chronic back pain  Lymphadenopathy:    She has no cervical adenopathy.  Neurological: She is alert and oriented to person, place, and time. She  has normal reflexes. No cranial nerve deficit. She exhibits normal muscle tone. Coordination normal.  Slight weakness LUE and LLE  Skin: Skin is warm and dry. No rash noted. She is not diaphoretic. No erythema. No pallor.  Psychiatric: She has a normal mood and affect. Her behavior is normal. Judgment and thought content normal.    Filed Vitals:   05/21/14 1129  BP: 120/70  Pulse: 68  Temp: 99 F (37.2 C)  TempSrc: Tympanic  Resp: 20      Labs reviewed: Basic Metabolic Panel:  Recent Labs  16/10/96 0033 05/07/14 1720 05/07/14 2046  05/09/14 0443 05/12/14 05/20/14  NA 137 136* 134*  --  138 132* 139  K 4.0 4.4 4.2  --  4.0 4.0 4.2  CL 100 97 102  --  102  --   --   CO2  --  23  --   --  23  --   --   GLUCOSE 104* 98 107*  --  100*  --   --   BUN 14 13 14   --  12 14 12   CREATININE 0.80 0.65 0.60  --  0.68 0.7 0.7  CALCIUM  --  9.2  --   --  8.6  --   --   TSH  --   --   --   < >  --   --  0.24*  < > = values in this interval not displayed. Liver Function Tests: No results found for this basename: AST, ALT, ALKPHOS,  BILITOT, PROT, ALBUMIN,  in the last 8760 hours No results found for this basename: LIPASE, AMYLASE,  in the last 8760 hours No results found for this basename: AMMONIA,  in the last 8760 hours CBC:  Recent Labs  05/06/14 0025  05/07/14 1720 05/07/14 2046 05/09/14 0443 05/12/14  WBC 5.5  --  5.4  --  5.1 5.1  HGB 13.3  < > 14.4 14.6 12.4 13.1  HCT 39.0  < > 43.4 43.0 37.6 38  MCV 87.1  --  90.2  --  88.5  --   PLT 224  --  256  --  220 260  < > = values in this interval not displayed. Lipid Panel:  Recent Labs  05/08/14 0540  CHOL 198  HDL 55  LDLCALC 126*  TRIG 86  CHOLHDL 3.6    Past Procedures:  05/06/14 X-ray Pelvis:  IMPRESSION:  Successful reduction of previously noted left hip prosthesis  dislocation. No evidence of fracture or loosening.    05/07/14 CT head and cervical spine w/o contrast:  IMPRESSION:  Probable acute to subacute right occipital infarct without evidence  of hemorrhage. Wallace Cullens matter involvement is not well demonstrated  raising the question that this could be related to vasogenic edema.  Degenerative changes in the cervical spine without evidence of an  acute fracture.   05/08/14 MRI brain:  IMPRESSION:  1. Acute, moderately large right PCA territory infarct. No  hemorrhage.  2. Fetal type origins of the PCAs with occlusion of the proximal P2  segment on the right.  3. Intracranial atherosclerosis with severe stenoses of the proximal  basilar artery and left P2 segment and mild to moderate stenoses of  the distal left vertebral artery, left P2 segment, and left MCA  bifurcation.  05/08/14 Echocardiogram:  Study Conclusions  - Left ventricle: The cavity size was normal. Wall thickness was increased in a pattern of moderate LVH. Systolic function was normal. The estimated ejection  fraction was in the range of 60% to 65%. - Aortic valve: There was mild to moderate stenosis. There was mild regurgitation - Mitral valve: There was mild  regurgitation.   Assessment/Plan Chronic low back pain Not new. Change Hydrocodone/ApAp 5/325mg  bid for better pain control instead of prn.     Hyponatremia Resolved, Na 139 05/20/14  Unspecified hypothyroidism TSH 0.245 05/20/14, decrease Levothyroxine to 50mcg from 75mcg, f/u TSH in 6 wks.   Anxiety Takes Ativan 0.5mg  bid.     CVA (cerebral infarction) C/o LUE and LLE numbness. Muscle strength 5/5  05/08/14 MRI brain:  IMPRESSION:  1. Acute, moderately large right PCA territory infarct. No  hemorrhage.  2. Fetal type origins of the PCAs with occlusion of the proximal P2  segment on the right.  3. Intracranial atherosclerosis with severe stenoses of the proximal  basilar artery and left P2 segment and mild to moderate stenoses of  the distal left vertebral artery, left P2 segment, and left MCA  bifurcation.  Follow up with the stroke MD in 2 weeks, Dr. Roda ShuttersXu.  Continue Plavix and Zocor for risk reduction.        Family/ Staff Communication: observe the patient  Goals of Care: [place]  Labs/tests ordered: TSH 6 wks(05/21/14)

## 2014-05-21 NOTE — Assessment & Plan Note (Signed)
Not new. Change Hydrocodone/ApAp 5/325mg bid for better pain control instead of prn.     

## 2014-05-21 NOTE — Assessment & Plan Note (Signed)
Takes Ativan 0.5mg bid.    

## 2014-05-21 NOTE — Assessment & Plan Note (Signed)
C/o LUE and LLE numbness. Muscle strength 5/5  05/08/14 MRI brain:  IMPRESSION:  1. Acute, moderately large right PCA territory infarct. No  hemorrhage.  2. Fetal type origins of the PCAs with occlusion of the proximal P2  segment on the right.  3. Intracranial atherosclerosis with severe stenoses of the proximal  basilar artery and left P2 segment and mild to moderate stenoses of  the distal left vertebral artery, left P2 segment, and left MCA  bifurcation.  Follow up with the stroke MD in 2 weeks, Dr. Xu.   Continue Plavix and Zocor for risk reduction.    

## 2014-05-21 NOTE — Assessment & Plan Note (Signed)
TSH 0.245 05/20/14, decrease Levothyroxine to 50mcg from 75mcg, f/u TSH in 6 wks.

## 2014-05-30 ENCOUNTER — Telehealth: Payer: Self-pay | Admitting: Neurology

## 2014-05-30 NOTE — Telephone Encounter (Signed)
[facility] since [facility] received letter that they had to reschedule Dr. Warren DanesXu's appointment per his schedule, offered first available on 07/09/14, but Duwayne HeckDanielle states that patient needs to be seen sooner due to decreased vision in left eye. Please call and advise.

## 2014-06-02 NOTE — Telephone Encounter (Signed)
[facility] and [facility] appointment for patient.

## 2014-06-09 ENCOUNTER — Encounter: Payer: Self-pay | Admitting: Neurology

## 2014-06-09 ENCOUNTER — Non-Acute Institutional Stay (SKILLED_NURSING_FACILITY): Payer: Medicare Other | Admitting: Nurse Practitioner

## 2014-06-09 ENCOUNTER — Encounter: Payer: Self-pay | Admitting: Nurse Practitioner

## 2014-06-09 ENCOUNTER — Ambulatory Visit (INDEPENDENT_AMBULATORY_CARE_PROVIDER_SITE_OTHER): Payer: Medicare Other | Admitting: Neurology

## 2014-06-09 VITALS — BP 123/72 | HR 78

## 2014-06-09 DIAGNOSIS — I6350 Cerebral infarction due to unspecified occlusion or stenosis of unspecified cerebral artery: Secondary | ICD-10-CM

## 2014-06-09 DIAGNOSIS — I82409 Acute embolism and thrombosis of unspecified deep veins of unspecified lower extremity: Secondary | ICD-10-CM

## 2014-06-09 DIAGNOSIS — E039 Hypothyroidism, unspecified: Secondary | ICD-10-CM

## 2014-06-09 DIAGNOSIS — G8929 Other chronic pain: Secondary | ICD-10-CM

## 2014-06-09 DIAGNOSIS — I824Y9 Acute embolism and thrombosis of unspecified deep veins of unspecified proximal lower extremity: Secondary | ICD-10-CM

## 2014-06-09 DIAGNOSIS — E785 Hyperlipidemia, unspecified: Secondary | ICD-10-CM

## 2014-06-09 DIAGNOSIS — K5900 Constipation, unspecified: Secondary | ICD-10-CM

## 2014-06-09 DIAGNOSIS — I82412 Acute embolism and thrombosis of left femoral vein: Secondary | ICD-10-CM

## 2014-06-09 DIAGNOSIS — I639 Cerebral infarction, unspecified: Secondary | ICD-10-CM

## 2014-06-09 DIAGNOSIS — F411 Generalized anxiety disorder: Secondary | ICD-10-CM

## 2014-06-09 DIAGNOSIS — I82402 Acute embolism and thrombosis of unspecified deep veins of left lower extremity: Secondary | ICD-10-CM

## 2014-06-09 DIAGNOSIS — M545 Low back pain, unspecified: Secondary | ICD-10-CM

## 2014-06-09 DIAGNOSIS — F413 Other mixed anxiety disorders: Secondary | ICD-10-CM

## 2014-06-09 HISTORY — DX: Acute embolism and thrombosis of left femoral vein: I82.412

## 2014-06-09 HISTORY — DX: Acute embolism and thrombosis of unspecified deep veins of unspecified lower extremity: I82.409

## 2014-06-09 NOTE — Progress Notes (Signed)
Patient ID: [name], female   DOB: [birth year], 80 y.o.   MRN: [id number]   Code Status: DNR  Allergies  Allergen Reactions  . Augmentin [Amoxicillin-Pot Clavulanate] Nausea Only    Chief Complaint  Patient presents with  . Medical Management of Chronic Issues  . Acute Visit    LLE DVT    HPI: Patient is a 80 y.o. female seen in the SNF at [facility] today for evaluation of new LLE DVT,  thyroid, CVA with left sided weakness and numbness/paresthesias,  and other chronic medical conditions.   Hospitalized from 05/07/2014-05/10/2014  For CVA (cerebral infarction) with mild left sided weakness and Dislocation of the left prosthesis-spontaneous dislocation of her left hip was reduced by orthopedics and anesthesia.  Head CT obtained shows probable acute to subacute right occipital infarct without evidence of hemorrhage however no significant grave matter of all meds with a question of vasogenic edema. Degenerative changes were noted on the cervical spine. Hip x-ray within normal limits.   Problem List Items Addressed This Visit   Anxiety disorder     Takes Ativan 0.5mg  bid.       Chronic low back pain     Not new. Change Hydrocodone/ApAp 5/325mg  bid for better pain control instead of prn.        Left femoral vein DVT - Primary     Left leg swelling, aches, feels heavy 06/06/14. 06/06/14 Duplex Venous US LLE: deep venous thrombosis at left common femoral and mid to proximal superficial femoral veins. Eliquis  bid x 7 days then  bid initiated-consulted with Dr. Chilton Si. Risk vs benefit discussed with the patient.        Relevant Medications      apixaban (ELIQUIS) 5 MG TABS tablet   Unspecified constipation     Increase MiraLax 17gm +4 Oz water po bid.     Unspecified hypothyroidism     TSH 0.245 05/20/14, decrease Levothyroxine to from , f/u TSH in 6 wks.         Review of Systems: Review of Systems  Constitutional: Negative for fever,  chills, weight loss, malaise/fatigue and diaphoresis.  HENT: Positive for hearing loss. Negative for congestion, ear discharge, ear pain, nosebleeds, sore throat and tinnitus.   Eyes: Positive for blurred vision. Negative for double vision, photophobia, pain, discharge and redness.       Left eye  Respiratory: Negative for cough, hemoptysis, sputum production, shortness of breath, wheezing and stridor.   Cardiovascular: Positive for leg swelling. Negative for chest pain, palpitations, orthopnea, claudication and PND.       LLE swelling and aches.   Gastrointestinal: Negative for heartburn, nausea, vomiting, abdominal pain, diarrhea, constipation, blood in stool and melena.  Genitourinary: Positive for frequency. Negative for dysuria, urgency, hematuria and flank pain.  Musculoskeletal: Positive for back pain. Negative for falls, joint pain, myalgias and neck pain.       Chronic  Skin: Negative for itching and rash.  Neurological: Positive for sensory change and focal weakness. Negative for dizziness, tingling, tremors, speech change, seizures, loss of consciousness, weakness and headaches.       Numbness and paraesthesia LUE and LLE. Left sided weakness even if grip strength 5/5  Endo/Heme/Allergies: Negative for environmental allergies and polydipsia. Does not bruise/bleed easily.  Psychiatric/Behavioral: Negative for depression, suicidal ideas, hallucinations, memory loss and substance abuse. The patient is not nervous/anxious and does not have insomnia.      Past Medical History  Diagnosis Date  .  Deviated nasal septum   . Dysfunction of eustachian tube   . Unspecified sinusitis (chronic)   . Allergic rhinitis, cause unspecified   . Arthritis   . Cataract   . Senile osteoporosis   . Hx of syncope     had loop recorder for 2 years now explanted, only PACs noted on device for over 2 years, no a fib, Echo 08/18/09 EF >55%mild concentric LVHnormal myoview, normal carotid dopplers 2010  .  Pruritus of skin     poss. related to Losartan  . Hyperlipidemia   . Normal cardiac stress test     lexiscan myoview 04/08/08  . CVA (cerebral infarction) 2015  . Cerebral atrophy 2015  . Cerebrovascular disease 2015  . Dislocation of hip prosthesis 2015  . Lumbago 2015  . Unspecified hypothyroidism 2015  . Anxiety 2015  . Hyponatremia 2015  . Cervical spine degeneration 2015   Past Surgical History  Procedure Laterality Date  . Tonsillectomy    . Total hip arthroplasty      bilateral  . Pelvic sling    . Joint replacement    . Loop recorder implant  07/2009    to eval syncope  . Loop recorder explant  01/12/2012   Social History:   reports that she has never smoked. She does not have any smokeless tobacco history on file. She reports that she does not drink alcohol or use illicit drugs.  Family History  Problem Relation Age of Onset  . Cancer Father     Medications: Patient's Medications  New Prescriptions   No medications on file  Previous Medications   APIXABAN (ELIQUIS) 5 MG TABS TABLET    Take 5 mg by mouth 2 (two) times daily.   CETIRIZINE (ZYRTEC) 10 MG TABLET    Take 10 mg by mouth daily.   HYDROCODONE-ACETAMINOPHEN (NORCO/VICODIN) 5-325 MG PER TABLET    Take 1 tablet by mouth every 6 (six) hours as needed for moderate pain.   LEVOTHYROXINE (SYNTHROID, LEVOTHROID) 50 MCG TABLET    Take 50 mcg by mouth daily before breakfast.   LORAZEPAM (ATIVAN) 1 MG TABLET    Take 0.5 tablets (0.5 mg total) by mouth 2 (two) times daily.   MECLIZINE (ANTIVERT) 25 MG TABLET    Take 25 mg by mouth 3 (three) times daily as needed for dizziness.   MONTELUKAST (SINGULAIR) 10 MG TABLET    Take 10 mg by mouth at bedtime.   POLYETHYLENE GLYCOL (MIRALAX / GLYCOLAX) PACKET    Take 17 g by mouth 2 (two) times daily.   SIMVASTATIN (ZOCOR) 20 MG TABLET    Take 1 tablet (20 mg total) by mouth daily at 6 PM.  Modified Medications   No medications on file  Discontinued Medications    CLOPIDOGREL (PLAVIX) 75 MG TABLET    Take 1 tablet (75 mg total) by mouth daily.     Physical Exam:  Physical Exam  Constitutional: She is oriented to person, place, and time. She appears well-developed and well-nourished. No distress.  HENT:  Head: Normocephalic and atraumatic.  Right Ear: External ear normal.  Left Ear: External ear normal.  Nose: Nose normal.  Mouth/Throat: Oropharynx is clear and moist. No oropharyngeal exudate.  Eyes: Conjunctivae and EOM are normal. Pupils are equal, round, and reactive to light. Right eye exhibits no discharge. Left eye exhibits no discharge. No scleral icterus.  Neck: Normal range of motion. Neck supple. No JVD present. No tracheal deviation present. No thyromegaly present.  Cardiovascular:  Normal rate and regular rhythm.  Exam reveals no gallop and no friction rub.   Murmur heard. 4/6  Pulmonary/Chest: Effort normal and breath sounds normal. No stridor. No respiratory distress. She has no wheezes. She has no rales. She exhibits no tenderness.  Abdominal: Soft. Bowel sounds are normal. She exhibits no distension and no mass. There is no tenderness. There is no rebound and no guarding.  Musculoskeletal: Normal range of motion. She exhibits edema. She exhibits no tenderness.  Chronic back pain. LLE swelling 1+ and aches   Lymphadenopathy:    She has no cervical adenopathy.  Neurological: She is alert and oriented to person, place, and time. She has normal reflexes. No cranial nerve deficit. She exhibits normal muscle tone. Coordination normal.  Slight weakness LUE and LLE  Skin: Skin is warm and dry. No rash noted. She is not diaphoretic. No erythema. No pallor.  Psychiatric: She has a normal mood and affect. Her behavior is normal. Judgment and thought content normal.    Filed Vitals:   06/09/14 0924  BP: 136/54  Pulse: 72  Temp: 97.9 F (36.6 C)  TempSrc: Tympanic  Resp: 16      Labs reviewed: Basic Metabolic Panel:  Recent Labs   05/06/14 0033 05/07/14 1720 05/07/14 2046  05/09/14 0443 05/12/14 05/20/14  NA 137 136* 134*  --  138 132* 139  K 4.0 4.4 4.2  --  4.0 4.0 4.2  CL 100 97 102  --  102  --   --   CO2  --  23  --   --  23  --   --   GLUCOSE 104* 98 107*  --  100*  --   --   BUN --  CREATININE 0.80 0.65 0.60  --  0.68 0.7 0.7  CALCIUM  --  9.2  --   --  8.6  --   --   TSH  --   --   --   < >  --   --  0.24*  < > = values in this interval not displayed. Liver Function Tests: No results found for this basename: AST, ALT, ALKPHOS, BILITOT, PROT, ALBUMIN,  in the last 8760 hours No results found for this basename: LIPASE, AMYLASE,  in the last 8760 hours No results found for this basename: AMMONIA,  in the last 8760 hours CBC:  Recent Labs  05/06/14 0025  05/07/14 1720 05/07/14 2046 05/09/14 0443 05/12/14  WBC 5.5  --  5.4  --  5.1 5.1  HGB 13.3  < > 14.4 14.6 12.4 13.1  HCT 39.0  < > 43.4 43.0 37.6 38  MCV 87.1  --  90.2  --  88.5  --   PLT 224  --  256  --  220 260  < > = values in this interval not displayed. Lipid Panel:  Recent Labs  05/08/14 0540  CHOL 198  HDL 55  LDLCALC 126*  TRIG 86  CHOLHDL 3.6    Past Procedures:  05/06/14 X-ray Pelvis:  IMPRESSION:  Successful reduction of previously noted left hip prosthesis  dislocation. No evidence of fracture or loosening.    05/07/14 CT head and cervical spine w/o contrast:  IMPRESSION:  Probable acute to subacute right occipital infarct without evidence  of hemorrhage. Wallace Cullens matter involvement is not well demonstrated  raising the question that this could be related to vasogenic edema.  Degenerative changes in the cervical  spine without evidence of an  acute fracture.   05/08/14 MRI brain:  IMPRESSION:  1. Acute, moderately large right PCA territory infarct. No  hemorrhage.  2. Fetal type origins of the PCAs with occlusion of the proximal P2  segment on the right.  3. Intracranial atherosclerosis with  severe stenoses of the proximal  basilar artery and left P2 segment and mild to moderate stenoses of  the distal left vertebral artery, left P2 segment, and left MCA  bifurcation.  05/08/14 Echocardiogram:  Study Conclusions  - Left ventricle: The cavity size was normal. Wall thickness was increased in a pattern of moderate LVH. Systolic function was normal. The estimated ejection fraction was in the range of 60% to 65%. - Aortic valve: There was mild to moderate stenosis. There was mild regurgitation - Mitral valve: There was mild regurgitation.  06/06/14 Duplex Venous US LLE: deep venous thrombosis at left common femoral and mid to proximal superficial femoral veins.    Assessment/Plan Left femoral vein DVT Left leg swelling, aches, feels heavy 06/06/14. 06/06/14 Duplex Venous US LLE: deep venous thrombosis at left common femoral and mid to proximal superficial femoral veins. Eliquis  bid x 7 days then  bid initiated-consulted with Dr. Chilton Si. Risk vs benefit discussed with the patient.      Unspecified hypothyroidism TSH 0.245 05/20/14, decrease Levothyroxine to from , f/u TSH in 6 wks.    Anxiety disorder Takes Ativan 0.5mg  bid.     CVA (cerebral infarction) C/o LUE and LLE numbness. Muscle strength 5/5  05/08/14 MRI brain:  IMPRESSION:  1. Acute, moderately large right PCA territory infarct. No  hemorrhage.  2. Fetal type origins of the PCAs with occlusion of the proximal P2  segment on the right.  3. Intracranial atherosclerosis with severe stenoses of the proximal  basilar artery and left P2 segment and mild to moderate stenoses of  the distal left vertebral artery, left P2 segment, and left MCA  bifurcation.  Follow up with the stroke MD in 2 weeks, Dr. Roda Shutters  dc'd Plavix 06/06/14, started Eliquis for new onset DVT LLE in setting of recent CVA and continued Zocor for risk reduction.     Unspecified constipation Increase MiraLax 17gm +4 Oz water  po bid.   Chronic low back pain Not new. Change Hydrocodone/ApAp 5/325mg  bid for better pain control instead of prn.        Family/ Staff Communication: observe the patient  Goals of Care: IL  Labs/tests ordered: TSH 6 wks(05/21/14)

## 2014-06-09 NOTE — Assessment & Plan Note (Signed)
Left leg swelling, aches, feels heavy 06/06/14. 06/06/14 Duplex Venous US LLE: deep venous thrombosis at left common femoral and mid to proximal superficial femoral veins. Eliquis  bid x 7 days then  bid initiated-consulted with Dr. Chilton Si. Risk vs benefit discussed with the patient.

## 2014-06-09 NOTE — Assessment & Plan Note (Signed)
C/o LUE and LLE numbness. Muscle strength 5/5  05/08/14 MRI brain:  IMPRESSION:  1. Acute, moderately large right PCA territory infarct. No  hemorrhage.  2. Fetal type origins of the PCAs with occlusion of the proximal P2  segment on the right.  3. Intracranial atherosclerosis with severe stenoses of the proximal  basilar artery and left P2 segment and mild to moderate stenoses of  the distal left vertebral artery, left P2 segment, and left MCA  bifurcation.  Follow up with the stroke MD in 2 weeks, Dr. Roda Shutters  dc'd Plavix 06/06/14, started Eliquis for new onset DVT LLE in setting of recent CVA and continued Zocor for risk reduction.

## 2014-06-09 NOTE — Progress Notes (Signed)
STROKE NEUROLOGY FOLLOW UP NOTE  NAME: [name] DOB: March 31, 1922  REASON FOR VISIT: stroke follow up HISTORY FROM: chart  Today we had the pleasure of seeing [name] in follow-up at our Neurology Clinic. Pt was accompanied by nurse from NH.   History Summary 80 yo white F with PMH of CVA, HLD, left hip dislocation was admitted 05/07/14 due to left hemiparesis and left hemiparesthesia. MRI showed moderately large right PCA territory infarct including right thalamus. Felt to be thrombotic, started on ASA  and discharged with ASA and lipitor. Her left hip dislocation was treated in ER and on discharge she still has 4/5 left leg weakness and left arm and leg numbness with left hemianopia. She was sent to SNF.   Interval History During the interval time, the patient has been doing fair. However, she was found to have left femoral vein DVT on 8/21 with venous doppler due to left leg swelling and pain. She was put on eliquis this morning. She still has significant swelling of left leg now and pt complains of heaviness and numbness of left leg.   She still complain of left arm numbness, no weakness from her stroke one month ago. She still has visual field problem on the left also due to stroke last month. She currently can use walker to walk at [facility]. Easily to get tired, leg heavy and stiff if walks long. No facial numbness.   REVIEW OF SYSTEMS: Full 14 system review of systems performed and notable only for those listed below and in HPI above, all others are negative:  Constitutional: N/A  Cardiovascular: leg swelling  Ear/Nose/Throat: N/A  Skin: N/A  Eyes: N/A  Respiratory: N/A  Gastroitestinal: N/A  Genitourinary: N/A Hematology/Lymphatic: N/A  Endocrine: N/A  Musculoskeletal: N/A  Allergy/Immunology: N/A  Neurological: numbness, walking difficulty  Psychiatric: N/A  The following represents the patient's updated allergies and side effects list: Allergies    Allergen Reactions  . Augmentin [Amoxicillin-Pot Clavulanate] Nausea Only    Labs since last visit of relevance include the following: Results for orders placed in visit on 05/21/14  BASIC METABOLIC PANEL      Result Value Ref Range   Glucose 87     BUN 12  4 - 21 mg/dL   Creatinine 0.7  0.5 - 1.1 mg/dL   Potassium 4.2  3.4 - 5.3 mmol/L   Sodium 139  137 - 147 mmol/L  TSH      Result Value Ref Range   TSH 0.24 (*) 0.41 - 5.90 uIU/mL    The neurologically relevant items on the patient's problem list were reviewed on today's visit.  Neurologic Examination  A problem focused neurological exam (12 or more points of the single system neurologic examination, vital signs counts as 1 point, cranial nerves count for 8 points) was performed.  Blood pressure 123/72, pulse 78, height 0' (0 m), weight 0 lb (0 kg).  General - Well nourished, well developed, in no apparent distress.  Ophthalmologic - not able to see through.  Cardiovascular - Regular rate and rhythm with no murmur.  Extremities - Left whole leg swelling, redness but no tenderness on touch  Mental Status -  Level of arousal and orientation to time, place, and person were intact. Language including expression, naming, repetition, comprehension was assessed and found intact.  Cranial Nerves II - XII - II - left upper quadranopia and partial left lower quadrianopia. III, IV, VI - Extraocular movements intact. V - Facial  sensation intact bilaterally. VII - Facial movement intact bilaterally. VIII - hard of hearing but vestibular intact bilaterally. X - Palate elevates symmetrically. XI - Chin turning & shoulder shrug intact bilaterally. XII - Tongue protrusion intact.  Motor Strength - The patient's strength was normal in all extremities except LLE 4/5 proximal and distally and pronator drift was absent.  Bulk was normal and fasciculations were absent.   Motor Tone - Muscle tone was assessed at the neck and appendages  and was normal.  Reflexes - The patient's reflexes were normal in all extremities and she had no pathological reflexes.  Sensory - Light touch, temperature/pinprick was decreased at LLE and LUE.    Coordination - The patient had normal movements in the hands with no ataxia or dysmetria.  Tremor was absent.  Gait and Station - not tested.  Data reviewed: I personally reviewed the images and agree with the radiology interpretations.  Ct Head Wo Contrast  05/07/2014  Probable acute to subacute right occipital infarct without evidence of hemorrhage. Wallace Cullens matter involvement is not well demonstrated raising the question that this could be related to vasogenic edema. MRI could be used to further evaluate given the possibility that this is related to edema and not ischemia, intravenous contrast material is recommended if that study is performed.  Ct Cervical Spine Wo Contrast  05/07/2014  Degenerative changes in the cervical spine without evidence of an acute fracture.  Mr [name] Wo Contrast  05/08/2014  Acute, moderately large right PCA territory infarct. No hemorrhage.  Mr [name] Head/brain Wo Cm  05/08/2014  Fetal type origins of the PCAs with occlusion of the proximal P2 segment on the right.  Intracranial atherosclerosis with severe stenoses of the proximal basilar artery and left P2 segment and mild to moderate stenoses of the distal left vertebral artery, left P2 segment, and left MCA bifurcation.  Carotid Doppler Bilateral: Mild calcified plaque origin ICA. 1-39% ICA stenosis. Vertbral artery flow is antegrade.  2D Echocardiogram  The estimated ejection fraction was in the range of 60% to 65%. - Aortic valve: There was mild to moderate stenosis. There was mild regurgitation. Valve area (VTI): 0.8 cm^2. Valve area (Vmax): 0.76 cm^2. - Mitral valve: There was mild regurgitation. - Atrial septum: No defect or patent foramen ovale was identified.  Duplex Venous US LLE 06/06/14 : deep venous  thrombosis at left common femoral and mid to proximal superficial femoral veins. Eliquis  bid x 7 days then  bid EKG - Normal sinus rhythm rate 79 beats per minute. Possible Inferior infarct , age undetermined Cannot rule out Anterior infarct , age undetermined. T wave abnormality, consider lateral ischemia Abnormal ECG.  For complete results please see formal report.   Assessment: As you may recall, she is a 80 y.o. Caucasian female with PMH of CVA, HLD, left hip dislocation admitted last month for right PCA stroke due to left sided weaknss, numbness and hemianopia. Typical for PCA stroke. Currently still has some numbness and hemianopia, but both improved. However, she had DVT on 8/21 venous doppler. Currently, she was put on eliquis for DVT. Still has significant left leg swelling.  Plan:  - OK with eliquis for DVT treatment - continue statin for stroke prevention - continue PT/OT - follow up with PCP for stroke risk factor modification - RTC in 3 months.  No orders of the defined types were placed in this encounter.    Patient Instructions  1. Continue the eliquis for DVT treatment 2. Continue  statin for stroke prevention 3. Continue other home meds 4. Work closely with PT/OT for left visual field loss and left LE weakness/swelling 5. Follow up with PCP for stroke risk factor modification 6. Follow up in clinic in 3 months.    Marvel Plan, MD PhD [facility] [address] [place], [place] [address] [phone]

## 2014-06-09 NOTE — Assessment & Plan Note (Signed)
Not new. Change Hydrocodone/ApAp 5/325mg bid for better pain control instead of prn.     

## 2014-06-09 NOTE — Patient Instructions (Signed)
1. Continue the eliquis for DVT treatment 2. Continue statin for stroke prevention 3. Continue other home meds 4. Work closely with PT/OT for left visual field loss and left LE weakness/swelling 5. Follow up with PCP for stroke risk factor modification 6. Follow up in clinic in 3 months.

## 2014-06-09 NOTE — Assessment & Plan Note (Signed)
TSH 0.245 05/20/14, decrease Levothyroxine to from , f/u TSH in 6 wks.

## 2014-06-09 NOTE — Assessment & Plan Note (Signed)
Increase MiraLax 17gm +4 Oz water po bid.

## 2014-06-09 NOTE — Assessment & Plan Note (Signed)
Takes Ativan 0.5mg bid.    

## 2014-06-12 ENCOUNTER — Non-Acute Institutional Stay (SKILLED_NURSING_FACILITY): Payer: Medicare Other | Admitting: Internal Medicine

## 2014-06-12 ENCOUNTER — Encounter: Payer: Self-pay | Admitting: Internal Medicine

## 2014-06-12 DIAGNOSIS — I82412 Acute embolism and thrombosis of left femoral vein: Secondary | ICD-10-CM

## 2014-06-12 DIAGNOSIS — I63531 Cerebral infarction due to unspecified occlusion or stenosis of right posterior cerebral artery: Secondary | ICD-10-CM

## 2014-06-12 DIAGNOSIS — I824Y9 Acute embolism and thrombosis of unspecified deep veins of unspecified proximal lower extremity: Secondary | ICD-10-CM

## 2014-06-12 DIAGNOSIS — I6350 Cerebral infarction due to unspecified occlusion or stenosis of unspecified cerebral artery: Secondary | ICD-10-CM

## 2014-06-12 DIAGNOSIS — K5900 Constipation, unspecified: Secondary | ICD-10-CM

## 2014-06-12 NOTE — Progress Notes (Signed)
Patient ID: [name], female   DOB: October 07, 1922, 80 y.o.   MRN: [id number]    Location:  [facility]   Place of Service: SNF (31)    Allergies  Allergen Reactions  . Augmentin [Amoxicillin-Pot Clavulanate] Nausea Only    Chief Complaint  Patient presents with  . Acute Visit    follow up left leg DVT    HPI:  Left femoral vein DVT: swollen left leg. DVT confirmed /06/06/14. Has been on Eliquis. Leg swelling is no worse, but it is not any better either. Leg feels like lead to patient.  Unspecified constipation: now on Miralax and Senokot.  Cerebral infarction due to stenosis of right posterior cerebral artery: continues in therapy/ Able to stand. Still wobbley.    Medications: Patient's Medications  New Prescriptions   No medications on file  Previous Medications   APIXABAN (ELIQUIS) 5 MG TABS TABLET    Take 5 mg by mouth 2 (two) times daily.   ATORVASTATIN (LIPITOR) 10 MG TABLET    Take 10 mg by mouth daily.   BISACODYL (DULCOLAX) 10 MG SUPPOSITORY    Place 10 mg rectally as needed for moderate constipation.   CETIRIZINE (ZYRTEC) 10 MG TABLET    Take 10 mg by mouth daily.   CLOPIDOGREL (PLAVIX) 75 MG TABLET    Take 75 mg by mouth daily.   HYDROCODONE-ACETAMINOPHEN (NORCO/VICODIN) 5-325 MG PER TABLET    Take 1 tablet by mouth every 6 (six) hours as needed for moderate pain.   LEVOTHYROXINE (SYNTHROID, LEVOTHROID) 50 MCG TABLET    Take 50 mcg by mouth daily before breakfast.   LORAZEPAM (ATIVAN) 1 MG TABLET    Take 0.5 tablets (0.5 mg total) by mouth 2 (two) times daily.   MECLIZINE (ANTIVERT) 25 MG TABLET    Take 25 mg by mouth 3 (three) times daily as needed for dizziness.   MONTELUKAST (SINGULAIR) 10 MG TABLET    Take 10 mg by mouth at bedtime.   POLYETHYLENE GLYCOL (MIRALAX / GLYCOLAX) PACKET    Take 17 g by mouth 2 (two) times daily.  Modified Medications   No medications on file  Discontinued Medications   No medications on file     Review of  Systems  Constitutional: Positive for activity change and fatigue. Negative for fever, diaphoresis and unexpected weight change.  HENT: Negative for ear pain, hearing loss, postnasal drip, rhinorrhea, sinus pressure, trouble swallowing and voice change.   Eyes: Negative.   Respiratory: Negative for cough, choking, chest tightness and shortness of breath.   Cardiovascular: Positive for leg swelling. Negative for chest pain and palpitations.       Left leg swollen and tender from the foot to the upper thigh.  Gastrointestinal: Positive for constipation. Negative for nausea, abdominal pain, diarrhea, blood in stool, abdominal distention and rectal pain.  Endocrine:       Hypothyroid  Genitourinary: Negative for dysuria, frequency, flank pain and difficulty urinating.  Musculoskeletal: Positive for arthralgias, back pain, gait problem, neck pain and neck stiffness. Negative for joint swelling and myalgias.  Skin: Negative.   Allergic/Immunologic: Negative.   Neurological: Positive for weakness (Mild left-sided hemiparesis). Negative for dizziness, tremors, seizures, facial asymmetry, speech difficulty, light-headedness, numbness and headaches.  Hematological: Negative.   Psychiatric/Behavioral: Negative.     Filed Vitals:   06/12/14 1309  BP: 118/74  Pulse: 72  Temp: 97.9 F (36.6 C)  Resp: 20  Height: _0  (1.626 m)  Weight: 140 lb 11.2  oz (63.821 kg)   Body mass index is 24.14 kg/(m^2).  Physical Exam  Constitutional: She is oriented to person, place, and time. She appears well-developed and well-nourished. No distress.  HENT:  Head: Normocephalic and atraumatic.  Right Ear: External ear normal.  Left Ear: External ear normal.  Nose: Nose normal.  Mouth/Throat: Oropharynx is clear and moist. No oropharyngeal exudate.  Eyes: Conjunctivae and EOM are normal. Pupils are equal, round, and reactive to light. Right eye exhibits no discharge. Left eye exhibits no discharge. No scleral  icterus.  Neck: Normal range of motion. Neck supple. No JVD present. No tracheal deviation present. No thyromegaly present.  Cardiovascular: Normal rate and regular rhythm.  Exam reveals no gallop and no friction rub.   Murmur heard. 4/6  Pulmonary/Chest: Effort normal and breath sounds normal. No stridor. No respiratory distress. She has no wheezes. She has no rales. She exhibits no tenderness.  Abdominal: Soft. Bowel sounds are normal. She exhibits no distension and no mass. There is no tenderness. There is no rebound and no guarding.  Musculoskeletal: Normal range of motion. She exhibits edema. She exhibits no tenderness.  Chronic back pain. LLE swelling 1+ and aches  DVT left leg.  Lymphadenopathy:    She has no cervical adenopathy.  Neurological: She is alert and oriented to person, place, and time. She has normal reflexes. No cranial nerve deficit. She exhibits normal muscle tone. Coordination normal.  Slight weakness LUE and LLE  Skin: Skin is warm and dry. No rash noted. She is not diaphoretic. No erythema. No pallor.  Psychiatric: She has a normal mood and affect. Her behavior is normal. Judgment and thought content normal.     Labs reviewed: Nursing Home on 05/21/2014  Component Date Value Ref Range Status  . Glucose 05/20/2014 87   Final  . BUN 05/20/2014 12  4 - 21 mg/dL Final  . Creatinine 05/20/2014 0.7  0.5 - 1.1 mg/dL Final  . Potassium 05/20/2014 4.2  3.4 - 5.3 mmol/L Final  . Sodium 05/20/2014 139  137 - 147 mmol/L Final  . TSH 05/20/2014 0.24* 0.41 - 5.90 uIU/mL Final  Nursing Home on 05/12/2014  Component Date Value Ref Range Status  . Hemoglobin 05/12/2014 13.1  12.0 - 16.0 g/dL Final  . HCT 05/12/2014 38  36 - 46 % Final  . Platelets 05/12/2014 260  150 - 399 K/L Final  . WBC 05/12/2014 5.1   Final  . Glucose 05/12/2014 98   Final  . BUN 05/12/2014 14  4 - 21 mg/dL Final  . Creatinine 05/12/2014 0.7  0.5 - 1.1 mg/dL Final  . Potassium 05/12/2014 4.0  3.4 -  5.3 mmol/L Final  . Sodium 05/12/2014 132* 137 - 147 mmol/L Final  Admission on 05/07/2014, Discharged on 05/10/2014  Component Date Value Ref Range Status  . WBC 05/07/2014 5.4  4.0 - 10.5 K/uL Final  . RBC 05/07/2014 4.81  3.87 - 5.11 MIL/uL Final  . Hemoglobin 05/07/2014 14.4  12.0 - 15.0 g/dL Final  . HCT 05/07/2014 43.4  36.0 - 46.0 % Final  . MCV 05/07/2014 90.2  78.0 - 100.0 fL Final  . MCH 05/07/2014 29.9  26.0 - 34.0 pg Final  . MCHC 05/07/2014 33.2  30.0 - 36.0 g/dL Final  . RDW 05/07/2014 13.0  11.5 - 15.5 % Final  . Platelets 05/07/2014 256  150 - 400 K/uL Final  . Sodium 05/07/2014 136* 137 - 147 mEq/L Final  . Potassium 05/07/2014 4.4  3.7 - 5.3 mEq/L Final  . Chloride 05/07/2014 97  96 - 112 mEq/L Final  . CO2 05/07/2014 23  19 - 32 mEq/L Final  . Glucose, Bld 05/07/2014 98  70 - 99 mg/dL Final  . BUN 05/07/2014 13  6 - 23 mg/dL Final  . Creatinine, Ser 05/07/2014 0.65  0.50 - 1.10 mg/dL Final  . Calcium 05/07/2014 9.2  8.4 - 10.5 mg/dL Final  . GFR calc non Af Amer 05/07/2014 76* >90 mL/min Final  . GFR calc Af Amer 05/07/2014 88* >90 mL/min Final   Comment: (NOTE)                          The eGFR has been calculated using the CKD EPI equation.                          This calculation has not been validated in all clinical situations.                          eGFR's persistently <90 mL/min signify possible Chronic Kidney                          Disease.  . Anion gap 05/07/2014 16* 5 - 15 Final  . Sodium 05/07/2014 134* 137 - 147 mEq/L Final  . Potassium 05/07/2014 4.2  3.7 - 5.3 mEq/L Final  . Chloride 05/07/2014 102  96 - 112 mEq/L Final  . BUN 05/07/2014 14  6 - 23 mg/dL Final  . Creatinine, Ser 05/07/2014 0.60  0.50 - 1.10 mg/dL Final  . Glucose, Bld 05/07/2014 107* 70 - 99 mg/dL Final  . Calcium, Ion 05/07/2014 1.09* 1.13 - 1.30 mmol/L Final  . TCO2 05/07/2014 25  0 - 100 mmol/L Final  . Hemoglobin 05/07/2014 14.6  12.0 - 15.0 g/dL Final  . HCT 05/07/2014  43.0  36.0 - 46.0 % Final  . Hemoglobin A1C 05/08/2014 5.8* <5.7 % Final   Comment: (NOTE)                                                                                                                         According to the ADA Clinical Practice Recommendations for 2011, when                          HbA1c is used as a screening test:                           >=6.5%   Diagnostic of Diabetes Mellitus                                    (if abnormal result is confirmed)  5.7-6.4%   Increased risk of developing Diabetes Mellitus                          References:Diagnosis and Classification of Diabetes Mellitus,Diabetes                          ALPF,7902,40(XBDZH 1):S62-S69 and Standards of Medical Care in                                  Diabetes - 2011,Diabetes GDJM,4268,34 (Suppl 1):S11-S61.  . Mean Plasma Glucose 05/08/2014 120* <117 mg/dL Final   Performed at [facility]  . Cholesterol 05/08/2014 198  0 - 200 mg/dL Final  . Triglycerides 05/08/2014 86  <150 mg/dL Final  . HDL 05/08/2014 55  >39 mg/dL Final  . Total CHOL/HDL Ratio 05/08/2014 3.6   Final  . VLDL 05/08/2014 17  0 - 40 mg/dL Final  . LDL Cholesterol 05/08/2014 126* 0 - 99 mg/dL Final   Comment:                                 Total Cholesterol/HDL:CHD Risk                          Coronary Heart Disease Risk Table                                              Men   Women                           1/2 Average Risk   3.4   3.3                           Average Risk       5.0   4.4                           2 X Average Risk   9.6   7.1                           3 X Average Risk  23.4   11.0                                                          Use the calculated Patient Ratio                          above and the CHD Risk Table                          to determine the patient's CHD Risk.  ATP III CLASSIFICATION (LDL):                            <100     mg/dL   Optimal                           100-129  mg/dL   Near or Above                                             Optimal                           130-159  mg/dL   Borderline                           160-189  mg/dL   High                           >190     mg/dL   Very High  . TSH 05/08/2014 1.590  0.350 - 4.500 uIU/mL Final  . Sodium 05/09/2014 138  137 - 147 mEq/L Final  . Potassium 05/09/2014 4.0  3.7 - 5.3 mEq/L Final  . Chloride 05/09/2014 102  96 - 112 mEq/L Final  . CO2 05/09/2014 23  19 - 32 mEq/L Final  . Glucose, Bld 05/09/2014 100* 70 - 99 mg/dL Final  . BUN 05/09/2014 12  6 - 23 mg/dL Final  . Creatinine, Ser 05/09/2014 0.68  0.50 - 1.10 mg/dL Final  . Calcium 05/09/2014 8.6  8.4 - 10.5 mg/dL Final  . GFR calc non Af Amer 05/09/2014 75* >90 mL/min Final  . GFR calc Af Amer 05/09/2014 87* >90 mL/min Final   Comment: (NOTE)                          The eGFR has been calculated using the CKD EPI equation.                          This calculation has not been validated in all clinical situations.                          eGFR's persistently <90 mL/min signify possible Chronic Kidney                          Disease.  . Anion gap 05/09/2014 13  5 - 15 Final  . WBC 05/09/2014 5.1  4.0 - 10.5 K/uL Final  . RBC 05/09/2014 4.25  3.87 - 5.11 MIL/uL Final  . Hemoglobin 05/09/2014 12.4  12.0 - 15.0 g/dL Final  . HCT 05/09/2014 37.6  36.0 - 46.0 % Final  . MCV 05/09/2014 88.5  78.0 - 100.0 fL Final  . MCH 05/09/2014 29.2  26.0 - 34.0 pg Final  . MCHC 05/09/2014 33.0  30.0 - 36.0 g/dL Final  . RDW 05/09/2014 13.1  11.5 - 15.5 % Final  . Platelets 05/09/2014 220  150 - 400 K/uL Final  Admission on 05/06/2014, Discharged on 05/06/2014  Component Date Value Ref Range Status  .  WBC 05/06/2014 5.5  4.0 - 10.5 K/uL Final  . RBC 05/06/2014 4.48  3.87 - 5.11 MIL/uL Final  . Hemoglobin 05/06/2014 13.3  12.0 - 15.0 g/dL Final  . HCT 05/06/2014 39.0  36.0 -  46.0 % Final  . MCV 05/06/2014 87.1  78.0 - 100.0 fL Final  . MCH 05/06/2014 29.7  26.0 - 34.0 pg Final  . MCHC 05/06/2014 34.1  30.0 - 36.0 g/dL Final  . RDW 05/06/2014 12.7  11.5 - 15.5 % Final  . Platelets 05/06/2014 224  150 - 400 K/uL Final  . Sodium 05/06/2014 137  137 - 147 mEq/L Final  . Potassium 05/06/2014 4.0  3.7 - 5.3 mEq/L Final  . Chloride 05/06/2014 100  96 - 112 mEq/L Final  . BUN 05/06/2014 14  6 - 23 mg/dL Final  . Creatinine, Ser 05/06/2014 0.80  0.50 - 1.10 mg/dL Final  . Glucose, Bld 05/06/2014 104* 70 - 99 mg/dL Final  . Calcium, Ion 05/06/2014 1.17  1.13 - 1.30 mmol/L Final  . TCO2 05/06/2014 21  0 - 100 mmol/L Final  . Hemoglobin 05/06/2014 13.9  12.0 - 15.0 g/dL Final  . HCT 05/06/2014 41.0  36.0 - 46.0 % Final    06/06/14 [place] LLE: DVT at the left common femoral and mid to proximal superficial femoral veins  Assessment/Plan 1. Left femoral vein DVT Continue Eliquis. Start compression hosiery net week. Continue PT.  2. Unspecified constipation Continue Miralax and Senokot  3. Cerebral infarction due to stenosis of right posterior cerebral artery Continue PT and OT

## 2014-06-13 ENCOUNTER — Ambulatory Visit: Payer: Medicare Other | Admitting: Neurology

## 2014-06-26 ENCOUNTER — Encounter: Payer: Self-pay | Admitting: Nurse Practitioner

## 2014-06-26 ENCOUNTER — Non-Acute Institutional Stay (SKILLED_NURSING_FACILITY): Payer: Medicare Other | Admitting: Nurse Practitioner

## 2014-06-26 DIAGNOSIS — I82412 Acute embolism and thrombosis of left femoral vein: Secondary | ICD-10-CM

## 2014-06-26 DIAGNOSIS — F341 Dysthymic disorder: Secondary | ICD-10-CM

## 2014-06-26 DIAGNOSIS — G8929 Other chronic pain: Secondary | ICD-10-CM

## 2014-06-26 DIAGNOSIS — F418 Other specified anxiety disorders: Secondary | ICD-10-CM

## 2014-06-26 DIAGNOSIS — M545 Low back pain, unspecified: Secondary | ICD-10-CM

## 2014-06-26 DIAGNOSIS — I824Y9 Acute embolism and thrombosis of unspecified deep veins of unspecified proximal lower extremity: Secondary | ICD-10-CM

## 2014-06-26 DIAGNOSIS — E039 Hypothyroidism, unspecified: Secondary | ICD-10-CM

## 2014-06-26 DIAGNOSIS — K5900 Constipation, unspecified: Secondary | ICD-10-CM

## 2014-06-26 LAB — BASIC METABOLIC PANEL
BUN: 10 mg/dL (ref 4–21)
CREATININE: 0.6 mg/dL (ref 0.5–1.1)
Glucose: 81 mg/dL
Potassium: 3.9 mmol/L (ref 3.4–5.3)
Sodium: 130 mmol/L — AB (ref 137–147)

## 2014-06-26 LAB — CBC AND DIFFERENTIAL
HEMATOCRIT: 35 % — AB (ref 36–46)
HEMOGLOBIN: 11.5 g/dL — AB (ref 12.0–16.0)
Platelets: 268 10*3/uL (ref 150–399)
WBC: 4.3 10*3/mL

## 2014-06-26 NOTE — Assessment & Plan Note (Signed)
Not new. Change Hydrocodone/ApAp 5/325mg bid for better pain control instead of prn.     

## 2014-06-26 NOTE — Assessment & Plan Note (Signed)
Better MiraLax 17gm +4 Oz water po bid.     

## 2014-06-26 NOTE — Assessment & Plan Note (Signed)
Left leg swelling, aches, feels heavy 06/06/14. 06/06/14 Duplex Venous US LLE: deep venous thrombosis at left common femoral and mid to proximal superficial femoral veins. Eliquis 10mg bid x 7 days then 5mg bid initiated-consulted with Dr. Green. Risk vs benefit discussed with the patient.      

## 2014-06-26 NOTE — Assessment & Plan Note (Addendum)
takes Ativan 0.5mg  bid. The patient admitted she is depressed and desires antidepressant-will try Sertraline  daily. She c/o "foggy head" will eliminate possible AR of medications: hold Zytec x 1wk-no better hold Lorazepam x1 wk-no better then hold Norco x 1wk. Will obtain CBC, CMP, and UA C/S

## 2014-06-26 NOTE — Assessment & Plan Note (Signed)
C/o LUE and LLE numbness. Muscle strength 5/5  05/08/14 MRI brain:  IMPRESSION:  1. Acute, moderately large right PCA territory infarct. No  hemorrhage.  2. Fetal type origins of the PCAs with occlusion of the proximal P2  segment on the right.  3. Intracranial atherosclerosis with severe stenoses of the proximal  basilar artery and left P2 segment and mild to moderate stenoses of  the distal left vertebral artery, left P2 segment, and left MCA  bifurcation.  Follow up with the stroke MD in 2 weeks, Dr. Xu  dc'd Plavix 06/06/14, started Eliquis for new onset DVT LLE in setting of recent CVA and continued Zocor for risk reduction.    

## 2014-06-26 NOTE — Progress Notes (Signed)
Patient ID: [name], female   DOB: [birth year], 80 y.o.   MRN: [id number]   Code Status: DNR  Allergies  Allergen Reactions  . Augmentin [Amoxicillin-Pot Clavulanate] Nausea Only    Chief Complaint  Patient presents with  . Medical Management of Chronic Issues  . Acute Visit    foggy head, depression.     HPI: Patient is a 80 y.o. female seen in the SNF at [facility] today for evaluation of new LLE DVT,  thyroid, CVA with left sided weakness and numbness/paresthesias,  and other chronic medical conditions.   Hospitalized from 05/07/2014-05/10/2014  For CVA (cerebral infarction) with mild left sided weakness and Dislocation of the left prosthesis-spontaneous dislocation of her left hip was reduced by orthopedics and anesthesia.  Head CT obtained shows probable acute to subacute right occipital infarct without evidence of hemorrhage however no significant grave matter of all meds with a question of vasogenic edema. Degenerative changes were noted on the cervical spine. Hip x-ray within normal limits.   Problem List Items Addressed This Visit   Chronic low back pain     Not new. Change Hydrocodone/ApAp 5/325mg  bid for better pain control instead of prn.      Unspecified hypothyroidism     TSH 0.245 05/20/14, decrease Levothyroxine to from , f/u TSH in 6 wks.       Mixed anxiety depressive disorder - Primary     takes Ativan 0.5mg  bid. The patient admitted she is depressed and desires antidepressant-will try Sertraline  daily. She c/o "foggy head" will eliminate possible AR of medications: hold Zytec x 1wk-no better hold Lorazepam x1 wk-no better then hold Norco x 1wk. Will obtain CBC, CMP, and UA C/S    Left femoral vein DVT     Left leg swelling, aches, feels heavy 06/06/14. 06/06/14 Duplex Venous US LLE: deep venous thrombosis at left common femoral and mid to proximal superficial femoral veins. Eliquis  bid x 7 days then  bid initiated-consulted  with Dr. Chilton Si. Risk vs benefit discussed with the patient.        Unspecified constipation     Better MiraLax 17gm +4 Oz water po bid.         Review of Systems: Review of Systems  Constitutional: Negative for fever, chills, weight loss, malaise/fatigue and diaphoresis.  HENT: Positive for hearing loss. Negative for congestion, ear discharge, ear pain, nosebleeds, sore throat and tinnitus.   Eyes: Positive for blurred vision. Negative for double vision, photophobia, pain, discharge and redness.       Left eye  Respiratory: Negative for cough, hemoptysis, sputum production, shortness of breath, wheezing and stridor.   Cardiovascular: Positive for leg swelling. Negative for chest pain, palpitations, orthopnea, claudication and PND.       LLE swelling and aches.   Gastrointestinal: Negative for heartburn, nausea, vomiting, abdominal pain, diarrhea, constipation, blood in stool and melena.  Genitourinary: Positive for frequency. Negative for dysuria, urgency, hematuria and flank pain.  Musculoskeletal: Positive for back pain. Negative for falls, joint pain, myalgias and neck pain.       Chronic  Skin: Negative for itching and rash.  Neurological: Positive for sensory change and focal weakness. Negative for dizziness, tingling, tremors, speech change, seizures, loss of consciousness, weakness and headaches.       Numbness and paraesthesia LUE and LLE. Left sided weakness even if grip strength 5/5  Endo/Heme/Allergies: Negative for environmental allergies and polydipsia. Does not bruise/bleed easily.  Psychiatric/Behavioral: Positive for  depression. Negative for suicidal ideas, hallucinations, memory loss and substance abuse. The patient is nervous/anxious. The patient does not have insomnia.        Foggy head     Past Medical History  Diagnosis Date  . Deviated nasal septum   . Dysfunction of eustachian tube   . Unspecified sinusitis (chronic)   . Allergic rhinitis, cause  unspecified   . Arthritis   . Cataract   . Senile osteoporosis   . Hx of syncope     had loop recorder for 2 years now explanted, only PACs noted on device for over 2 years, no a fib, Echo 08/18/09 EF >55%mild concentric LVHnormal myoview, normal carotid dopplers 2010  . Pruritus of skin     poss. related to Losartan  . Hyperlipidemia   . Normal cardiac stress test     lexiscan myoview 04/08/08  . CVA (cerebral infarction) 2015  . Cerebral atrophy 2015  . Cerebrovascular disease 2015  . Dislocation of hip prosthesis 2015  . Lumbago 2015  . Unspecified hypothyroidism 2015  . Anxiety 2015  . Hyponatremia 2015  . Cervical spine degeneration 2015   Past Surgical History  Procedure Laterality Date  . Tonsillectomy    . Total hip arthroplasty      bilateral  . Pelvic sling    . Joint replacement    . Loop recorder implant  07/2009    to eval syncope  . Loop recorder explant  01/12/2012   Social History:   reports that she has never smoked. She does not have any smokeless tobacco history on file. She reports that she does not drink alcohol or use illicit drugs.  Family History  Problem Relation Age of Onset  . Cancer Father     Medications: Patient's Medications  New Prescriptions   No medications on file  Previous Medications   APIXABAN (ELIQUIS) 5 MG TABS TABLET    Take 5 mg by mouth 2 (two) times daily.   ATORVASTATIN (LIPITOR) 10 MG TABLET    Take 10 mg by mouth daily.   BISACODYL (DULCOLAX) 10 MG SUPPOSITORY    Place 10 mg rectally as needed for moderate constipation.   CETIRIZINE (ZYRTEC) 10 MG TABLET    Take 10 mg by mouth daily.   CLOPIDOGREL (PLAVIX) 75 MG TABLET    Take 75 mg by mouth daily.   HYDROCODONE-ACETAMINOPHEN (NORCO/VICODIN) 5-325 MG PER TABLET    Take 1 tablet by mouth every 6 (six) hours as needed for moderate pain.   LEVOTHYROXINE (SYNTHROID, LEVOTHROID) 50 MCG TABLET    Take 50 mcg by mouth daily before breakfast.   LORAZEPAM (ATIVAN) 1 MG TABLET     Take 0.5 tablets (0.5 mg total) by mouth 2 (two) times daily.   MECLIZINE (ANTIVERT) 25 MG TABLET    Take 25 mg by mouth 3 (three) times daily as needed for dizziness.   MONTELUKAST (SINGULAIR) 10 MG TABLET    Take 10 mg by mouth at bedtime.   POLYETHYLENE GLYCOL (MIRALAX / GLYCOLAX) PACKET    Take 17 g by mouth 2 (two) times daily.  Modified Medications   No medications on file  Discontinued Medications   No medications on file     Physical Exam:  Physical Exam  Constitutional: She is oriented to person, place, and time. She appears well-developed and well-nourished. No distress.  HENT:  Head: Normocephalic and atraumatic.  Right Ear: External ear normal.  Left Ear: External ear normal.  Nose: Nose normal.  Mouth/Throat: Oropharynx is clear and moist. No oropharyngeal exudate.  Eyes: Conjunctivae and EOM are normal. Pupils are equal, round, and reactive to light. Right eye exhibits no discharge. Left eye exhibits no discharge. No scleral icterus.  Neck: Normal range of motion. Neck supple. No JVD present. No tracheal deviation present. No thyromegaly present.  Cardiovascular: Normal rate and regular rhythm.  Exam reveals no gallop and no friction rub.   Murmur heard. 4/6  Pulmonary/Chest: Effort normal and breath sounds normal. No stridor. No respiratory distress. She has no wheezes. She has no rales. She exhibits no tenderness.  Abdominal: Soft. Bowel sounds are normal. She exhibits no distension and no mass. There is no tenderness. There is no rebound and no guarding.  Musculoskeletal: Normal range of motion. She exhibits edema. She exhibits no tenderness.  Chronic back pain. LLE swelling 1+ and aches   Lymphadenopathy:    She has no cervical adenopathy.  Neurological: She is alert and oriented to person, place, and time. She has normal reflexes. No cranial nerve deficit. She exhibits normal muscle tone. Coordination normal.  Slight weakness LUE and LLE  Skin: Skin is warm and  dry. No rash noted. She is not diaphoretic. No erythema. No pallor.  Psychiatric: She has a normal mood and affect. Her behavior is normal. Judgment and thought content normal.    Filed Vitals:   06/26/14 1104  BP: 110/70  Pulse: 78  Temp: 98.2 F (36.8 C)  TempSrc: Tympanic  Resp: 18      Labs reviewed: Basic Metabolic Panel:  Recent Labs  40/98/11 0033 05/07/14 1720 05/07/14 2046  05/09/14 0443 05/12/14 05/20/14  NA 137 136* 134*  --  138 132* 139  K 4.0 4.4 4.2  --  4.0 4.0 4.2  CL 100 97 102  --  102  --   --   CO2  --  23  --   --  23  --   --   GLUCOSE 104* 98 107*  --  100*  --   --   BUN --  CREATININE 0.80 0.65 0.60  --  0.68 0.7 0.7  CALCIUM  --  9.2  --   --  8.6  --   --   TSH  --   --   --   < >  --   --  0.24*  < > = values in this interval not displayed. Liver Function Tests: No results found for this basename: AST, ALT, ALKPHOS, BILITOT, PROT, ALBUMIN,  in the last 8760 hours No results found for this basename: LIPASE, AMYLASE,  in the last 8760 hours No results found for this basename: AMMONIA,  in the last 8760 hours CBC:  Recent Labs  05/06/14 0025  05/07/14 1720 05/07/14 2046 05/09/14 0443 05/12/14  WBC 5.5  --  5.4  --  5.1 5.1  HGB 13.3  < > 14.4 14.6 12.4 13.1  HCT 39.0  < > 43.4 43.0 37.6 38  MCV 87.1  --  90.2  --  88.5  --   PLT 224  --  256  --  220 260  < > = values in this interval not displayed. Lipid Panel:  Recent Labs  05/08/14 0540  CHOL 198  HDL 55  LDLCALC 126*  TRIG 86  CHOLHDL 3.6    Past Procedures:  05/06/14 X-ray Pelvis:  IMPRESSION:  Successful reduction of previously noted left hip prosthesis  dislocation.  No evidence of fracture or loosening.    05/07/14 CT head and cervical spine w/o contrast:  IMPRESSION:  Probable acute to subacute right occipital infarct without evidence  of hemorrhage. Wallace Cullens matter involvement is not well demonstrated  raising the question that this could  be related to vasogenic edema.  Degenerative changes in the cervical spine without evidence of an  acute fracture.   05/08/14 MRI brain:  IMPRESSION:  1. Acute, moderately large right PCA territory infarct. No  hemorrhage.  2. Fetal type origins of the PCAs with occlusion of the proximal P2  segment on the right.  3. Intracranial atherosclerosis with severe stenoses of the proximal  basilar artery and left P2 segment and mild to moderate stenoses of  the distal left vertebral artery, left P2 segment, and left MCA  bifurcation.  05/08/14 Echocardiogram:  Study Conclusions  - Left ventricle: The cavity size was normal. Wall thickness was increased in a pattern of moderate LVH. Systolic function was normal. The estimated ejection fraction was in the range of 60% to 65%. - Aortic valve: There was mild to moderate stenosis. There was mild regurgitation - Mitral valve: There was mild regurgitation.  06/06/14 Duplex Venous US LLE: deep venous thrombosis at left common femoral and mid to proximal superficial femoral veins.    Assessment/Plan Mixed anxiety depressive disorder takes Ativan 0.5mg  bid. The patient admitted she is depressed and desires antidepressant-will try Sertraline  daily. She c/o "foggy head" will eliminate possible AR of medications: hold Zytec x 1wk-no better hold Lorazepam x1 wk-no better then hold Norco x 1wk. Will obtain CBC, CMP, and UA C/S  Unspecified constipation Better MiraLax 17gm +4 Oz water po bid.    Left femoral vein DVT Left leg swelling, aches, feels heavy 06/06/14. 06/06/14 Duplex Venous US LLE: deep venous thrombosis at left common femoral and mid to proximal superficial femoral veins. Eliquis  bid x 7 days then  bid initiated-consulted with Dr. Chilton Si. Risk vs benefit discussed with the patient.      Unspecified hypothyroidism TSH 0.245 05/20/14, decrease Levothyroxine to from , f/u TSH in 6 wks.     CVA (cerebral  infarction) C/o LUE and LLE numbness. Muscle strength 5/5  05/08/14 MRI brain:  IMPRESSION:  1. Acute, moderately large right PCA territory infarct. No  hemorrhage.  2. Fetal type origins of the PCAs with occlusion of the proximal P2  segment on the right.  3. Intracranial atherosclerosis with severe stenoses of the proximal  basilar artery and left P2 segment and mild to moderate stenoses of  the distal left vertebral artery, left P2 segment, and left MCA  bifurcation.  Follow up with the stroke MD in 2 weeks, Dr. Roda Shutters  dc'd Plavix 06/06/14, started Eliquis for new onset DVT LLE in setting of recent CVA and continued Zocor for risk reduction.      Chronic low back pain Not new. Change Hydrocodone/ApAp 5/325mg  bid for better pain control instead of prn.      Family/ Staff Communication: observe the patient  Goals of Care: [place]  Labs/tests ordered: TSH 6 wks(05/21/14)

## 2014-06-26 NOTE — Assessment & Plan Note (Signed)
TSH 0.245 05/20/14, decrease Levothyroxine to 50mcg from 75mcg, f/u TSH in 6 wks.   

## 2014-06-30 ENCOUNTER — Non-Acute Institutional Stay (SKILLED_NURSING_FACILITY): Payer: Medicare Other | Admitting: Nurse Practitioner

## 2014-06-30 ENCOUNTER — Encounter: Payer: Self-pay | Admitting: Nurse Practitioner

## 2014-06-30 DIAGNOSIS — E039 Hypothyroidism, unspecified: Secondary | ICD-10-CM

## 2014-06-30 DIAGNOSIS — M545 Low back pain, unspecified: Secondary | ICD-10-CM

## 2014-06-30 DIAGNOSIS — R3911 Hesitancy of micturition: Secondary | ICD-10-CM

## 2014-06-30 DIAGNOSIS — G8929 Other chronic pain: Secondary | ICD-10-CM

## 2014-06-30 DIAGNOSIS — E871 Hypo-osmolality and hyponatremia: Secondary | ICD-10-CM

## 2014-06-30 DIAGNOSIS — F418 Other specified anxiety disorders: Secondary | ICD-10-CM

## 2014-06-30 DIAGNOSIS — F341 Dysthymic disorder: Secondary | ICD-10-CM

## 2014-06-30 DIAGNOSIS — I824Y9 Acute embolism and thrombosis of unspecified deep veins of unspecified proximal lower extremity: Secondary | ICD-10-CM

## 2014-06-30 DIAGNOSIS — I82412 Acute embolism and thrombosis of left femoral vein: Secondary | ICD-10-CM

## 2014-06-30 DIAGNOSIS — K5900 Constipation, unspecified: Secondary | ICD-10-CM

## 2014-06-30 HISTORY — DX: Hesitancy of micturition: R39.11

## 2014-06-30 NOTE — Assessment & Plan Note (Signed)
takes Ativan 0.5mg  bid. The patient admitted she is depressed and desires antidepressant-started Sertraline  daily 06/26/14. No longer c/o "foggy head" will eliminate possible AR of medications: hold Zytec x 1wk-will change Zyrtec to prn.

## 2014-06-30 NOTE — Assessment & Plan Note (Signed)
C/o LUE and LLE numbness. Muscle strength 5/5  05/08/14 MRI brain:  IMPRESSION:  1. Acute, moderately large right PCA territory infarct. No  hemorrhage.  2. Fetal type origins of the PCAs with occlusion of the proximal P2  segment on the right.  3. Intracranial atherosclerosis with severe stenoses of the proximal  basilar artery and left P2 segment and mild to moderate stenoses of  the distal left vertebral artery, left P2 segment, and left MCA  bifurcation.  Follow up with the stroke MD in 2 weeks, Dr. Roda Shutters  dc'd Plavix 06/06/14, started Eliquis for new onset DVT LLE in setting of recent CVA and continued Zocor for risk reduction.

## 2014-06-30 NOTE — Progress Notes (Signed)
Patient ID: [name], female   DOB: Jan 24, 1922, 80 y.o.   MRN: [id number]   Code Status: DNR  Allergies  Allergen Reactions  . Augmentin [Amoxicillin-Pot Clavulanate] Nausea Only    Chief Complaint  Patient presents with  . Medical Management of Chronic Issues  . Acute Visit    urinary hesitancy    HPI: Patient is a 80 y.o. female seen in the SNF at [facility] today for evaluation of urinary hesitancy, new LLE DVT,  thyroid, CVA with left sided weakness and numbness/paresthesias,  and other chronic medical conditions.   Hospitalized from 05/07/2014-05/10/2014  For CVA (cerebral infarction) with mild left sided weakness and Dislocation of the left prosthesis-spontaneous dislocation of her left hip was reduced by orthopedics and anesthesia.  Head CT obtained shows probable acute to subacute right occipital infarct without evidence of hemorrhage however no significant grave matter of all meds with a question of vasogenic edema. Degenerative changes were noted on the cervical spine. Hip x-ray within normal limits.   Problem List Items Addressed This Visit   Chronic low back pain     Not new. Change Hydrocodone/ApAp 5/325mg  bid for better pain control instead of prn.         Unspecified hypothyroidism     TSH 0.245 05/20/14, decreased Levothyroxine to from , f/u TSH in 6 wks.        Mixed anxiety depressive disorder     takes Ativan 0.5mg  bid. The patient admitted she is depressed and desires antidepressant-started Sertraline  daily 06/26/14. No longer c/o "foggy head" will eliminate possible AR of medications: hold Zytec x 1wk-will change Zyrtec to prn.      Hyponatremia     05/20/14 Na 139 06/26/14 Na 139     Left femoral vein DVT     Left leg swelling, aches, feels heavy 06/06/14. 06/06/14 Duplex Venous US LLE: deep venous thrombosis at left common femoral and mid to proximal superficial femoral veins. Eliquis  bid x 7 days then  bid  initiated-consulted with Dr. Chilton Si. Risk vs benefit discussed with the patient.         Unspecified constipation     Better MiraLax 17gm +4 Oz water po bid.       Urinary hesitancy - Primary     She stated she has seen Dr. Urology and wears Estrogen ring(to be changed q37m). C/o urinary hesitancy today. Will obtain bladder scan to evaluate post void residual. F/u Urology and inform Urology her recent CVA and LLE DVT. 06/29/14 urine culture 06/29/14 E.Coli 20,000c/ml-will treat with Nitrofurantoin  bid x 7 along with Florastor bid.        Review of Systems: Review of Systems  Constitutional: Negative for fever, chills, weight loss, malaise/fatigue and diaphoresis.  HENT: Positive for hearing loss. Negative for congestion, ear discharge, ear pain, nosebleeds, sore throat and tinnitus.   Eyes: Positive for blurred vision. Negative for double vision, photophobia, pain, discharge and redness.       Left eye  Respiratory: Negative for cough, hemoptysis, sputum production, shortness of breath, wheezing and stridor.   Cardiovascular: Positive for leg swelling. Negative for chest pain, palpitations, orthopnea, claudication and PND.       LLE swelling and aches.   Gastrointestinal: Negative for heartburn, nausea, vomiting, abdominal pain, diarrhea, constipation, blood in stool and melena.  Genitourinary: Positive for frequency. Negative for dysuria, urgency, hematuria and flank pain.       New hesitancy  Musculoskeletal: Positive for back  pain. Negative for falls, joint pain, myalgias and neck pain.       Chronic  Skin: Negative for itching and rash.  Neurological: Positive for sensory change and focal weakness. Negative for dizziness, tingling, tremors, speech change, seizures, loss of consciousness, weakness and headaches.       Numbness and paraesthesia LUE and LLE. Left sided weakness even if grip strength 5/5  Endo/Heme/Allergies: Negative for environmental allergies and polydipsia.  Does not bruise/bleed easily.  Psychiatric/Behavioral: Positive for depression. Negative for suicidal ideas, hallucinations, memory loss and substance abuse. The patient is nervous/anxious. The patient does not have insomnia.        Foggy head-better since Zoloft started 06/26/14 and Zyrtec hold 06/26/14     Past Medical History  Diagnosis Date  . Deviated nasal septum   . Dysfunction of eustachian tube   . Unspecified sinusitis (chronic)   . Allergic rhinitis, cause unspecified   . Arthritis   . Cataract   . Senile osteoporosis   . Hx of syncope     had loop recorder for 2 years now explanted, only PACs noted on device for over 2 years, no a fib, Echo 08/18/09 EF >55%mild concentric LVHnormal myoview, normal carotid dopplers 2010  . Pruritus of skin     poss. related to Losartan  . Hyperlipidemia   . Normal cardiac stress test     lexiscan myoview 04/08/08  . CVA (cerebral infarction) 2015  . Cerebral atrophy 2015  . Cerebrovascular disease 2015  . Dislocation of hip prosthesis 2015  . Lumbago 2015  . Unspecified hypothyroidism 2015  . Anxiety 2015  . Hyponatremia 2015  . Cervical spine degeneration 2015   Past Surgical History  Procedure Laterality Date  . Tonsillectomy    . Total hip arthroplasty      bilateral  . Pelvic sling    . Joint replacement    . Loop recorder implant  07/2009    to eval syncope  . Loop recorder explant  01/12/2012   Social History:   reports that she has never smoked. She does not have any smokeless tobacco history on file. She reports that she does not drink alcohol or use illicit drugs.  Family History  Problem Relation Age of Onset  . Cancer Father     Medications: Patient's Medications  New Prescriptions   No medications on file  Previous Medications   APIXABAN (ELIQUIS) 5 MG TABS TABLET    Take 5 mg by mouth 2 (two) times daily.   ATORVASTATIN (LIPITOR) 10 MG TABLET    Take 10 mg by mouth daily.   BISACODYL (DULCOLAX) 10 MG  SUPPOSITORY    Place 10 mg rectally as needed for moderate constipation.   CETIRIZINE (ZYRTEC) 10 MG TABLET    Take 10 mg by mouth daily.   CLOPIDOGREL (PLAVIX) 75 MG TABLET    Take 75 mg by mouth daily.   HYDROCODONE-ACETAMINOPHEN (NORCO/VICODIN) 5-325 MG PER TABLET    Take 1 tablet by mouth every 6 (six) hours as needed for moderate pain.   LEVOTHYROXINE (SYNTHROID, LEVOTHROID) 50 MCG TABLET    Take 50 mcg by mouth daily before breakfast.   LORAZEPAM (ATIVAN) 1 MG TABLET    Take 0.5 tablets (0.5 mg total) by mouth 2 (two) times daily.   MECLIZINE (ANTIVERT) 25 MG TABLET    Take 25 mg by mouth 3 (three) times daily as needed for dizziness.   MONTELUKAST (SINGULAIR) 10 MG TABLET    Take 10 mg  by mouth at bedtime.   POLYETHYLENE GLYCOL (MIRALAX / GLYCOLAX) PACKET    Take 17 g by mouth 2 (two) times daily.  Modified Medications   No medications on file  Discontinued Medications   No medications on file     Physical Exam:  Physical Exam  Constitutional: She is oriented to person, place, and time. She appears well-developed and well-nourished. No distress.  HENT:  Head: Normocephalic and atraumatic.  Right Ear: External ear normal.  Left Ear: External ear normal.  Nose: Nose normal.  Mouth/Throat: Oropharynx is clear and moist. No oropharyngeal exudate.  Eyes: Conjunctivae and EOM are normal. Pupils are equal, round, and reactive to light. Right eye exhibits no discharge. Left eye exhibits no discharge. No scleral icterus.  Neck: Normal range of motion. Neck supple. No JVD present. No tracheal deviation present. No thyromegaly present.  Cardiovascular: Normal rate and regular rhythm.  Exam reveals no gallop and no friction rub.   Murmur heard. 4/6  Pulmonary/Chest: Effort normal and breath sounds normal. No stridor. No respiratory distress. She has no wheezes. She has no rales. She exhibits no tenderness.  Abdominal: Soft. Bowel sounds are normal. She exhibits no distension and no mass.  There is no tenderness. There is no rebound and no guarding.  Musculoskeletal: Normal range of motion. She exhibits edema. She exhibits no tenderness.  Chronic back pain. LLE swelling 1+ and aches   Lymphadenopathy:    She has no cervical adenopathy.  Neurological: She is alert and oriented to person, place, and time. She has normal reflexes. No cranial nerve deficit. She exhibits normal muscle tone. Coordination normal.  Slight weakness LUE and LLE  Skin: Skin is warm and dry. No rash noted. She is not diaphoretic. No erythema. No pallor.  Psychiatric: She has a normal mood and affect. Her behavior is normal. Judgment and thought content normal.    Filed Vitals:   06/30/14 0914  BP: 148/70  Pulse: 67  Temp: 96.9 F (36.1 C)  TempSrc: Tympanic  Resp: 18      Labs reviewed: Basic Metabolic Panel:  Recent Labs  40/98/11 0033 05/07/14 1720 05/07/14 2046  05/09/14 0443 05/12/14 05/20/14 06/26/14  NA 137 136* 134*  --  138 132* 139 130*  K 4.0 4.4 4.2  --  4.0 4.0 4.2 3.9  CL 100 97 102  --  102  --   --   --   CO2  --  23  --   --  23  --   --   --   GLUCOSE 104* 98 107*  --  100*  --   --   --   BUN --  CREATININE 0.80 0.65 0.60  --  0.68 0.7 0.7 0.6  CALCIUM  --  9.2  --   --  8.6  --   --   --   TSH  --   --   --   < >  --   --  0.24*  --   < > = values in this interval not displayed. Liver Function Tests: No results found for this basename: AST, ALT, ALKPHOS, BILITOT, PROT, ALBUMIN,  in the last 8760 hours No results found for this basename: LIPASE, AMYLASE,  in the last 8760 hours No results found for this basename: AMMONIA,  in the last 8760 hours CBC:  Recent Labs  05/06/14 0025  05/07/14 1720  05/09/14 0443 05/12/14 06/26/14  WBC 5.5  --  5.4  --  5.1 5.1 4.3  HGB 13.3  < > 14.4  < > 12.4 13.1 11.5*  HCT 39.0  < > 43.4  < > 37.6 38 35*  MCV 87.1  --  90.2  --  88.5  --   --   PLT 224  --  256  --  220 260 268  < > = values in this  interval not displayed. Lipid Panel:  Recent Labs  05/08/14 0540  CHOL 198  HDL 55  LDLCALC 126*  TRIG 86  CHOLHDL 3.6    Past Procedures:  05/06/14 X-ray Pelvis:  IMPRESSION:  Successful reduction of previously noted left hip prosthesis  dislocation. No evidence of fracture or loosening.    05/07/14 CT head and cervical spine w/o contrast:  IMPRESSION:  Probable acute to subacute right occipital infarct without evidence  of hemorrhage. Wallace Cullens matter involvement is not well demonstrated  raising the question that this could be related to vasogenic edema.  Degenerative changes in the cervical spine without evidence of an  acute fracture.   05/08/14 MRI brain:  IMPRESSION:  1. Acute, moderately large right PCA territory infarct. No  hemorrhage.  2. Fetal type origins of the PCAs with occlusion of the proximal P2  segment on the right.  3. Intracranial atherosclerosis with severe stenoses of the proximal  basilar artery and left P2 segment and mild to moderate stenoses of  the distal left vertebral artery, left P2 segment, and left MCA  bifurcation.  05/08/14 Echocardiogram:  Study Conclusions  - Left ventricle: The cavity size was normal. Wall thickness was increased in a pattern of moderate LVH. Systolic function was normal. The estimated ejection fraction was in the range of 60% to 65%. - Aortic valve: There was mild to moderate stenosis. There was mild regurgitation - Mitral valve: There was mild regurgitation.  06/06/14 Duplex Venous US LLE: deep venous thrombosis at left common femoral and mid to proximal superficial femoral veins.    Assessment/Plan Urinary hesitancy She stated she has seen Dr. Urology and wears Estrogen ring(to be changed q76m). C/o urinary hesitancy today. Will obtain bladder scan to evaluate post void residual. F/u Urology and inform Urology her recent CVA and LLE DVT. 06/29/14 urine culture 06/29/14 E.Coli 20,000c/ml-will treat with  Nitrofurantoin  bid x 7 along with Florastor bid.   Chronic low back pain Not new. Change Hydrocodone/ApAp 5/325mg  bid for better pain control instead of prn.       CVA (cerebral infarction) C/o LUE and LLE numbness. Muscle strength 5/5  05/08/14 MRI brain:  IMPRESSION:  1. Acute, moderately large right PCA territory infarct. No  hemorrhage.  2. Fetal type origins of the PCAs with occlusion of the proximal P2  segment on the right.  3. Intracranial atherosclerosis with severe stenoses of the proximal  basilar artery and left P2 segment and mild to moderate stenoses of  the distal left vertebral artery, left P2 segment, and left MCA  bifurcation.  Follow up with the stroke MD in 2 weeks, Dr. Roda Shutters  dc'd Plavix 06/06/14, started Eliquis for new onset DVT LLE in setting of recent CVA and continued Zocor for risk reduction.       Unspecified hypothyroidism TSH 0.245 05/20/14, decreased Levothyroxine to from , f/u TSH in 6 wks.      Mixed anxiety depressive disorder takes Ativan 0.5mg  bid. The patient admitted she is depressed and desires antidepressant-started Sertraline  daily 06/26/14. No  longer c/o "foggy head" will eliminate possible AR of medications: hold Zytec x 1wk-will change Zyrtec to prn.    Hyponatremia 05/20/14 Na 139 06/26/14 Na 139   Left femoral vein DVT Left leg swelling, aches, feels heavy 06/06/14. 06/06/14 Duplex Venous US LLE: deep venous thrombosis at left common femoral and mid to proximal superficial femoral veins. Eliquis  bid x 7 days then  bid initiated-consulted with Dr. Chilton Si. Risk vs benefit discussed with the patient.       Unspecified constipation Better MiraLax 17gm +4 Oz water po bid.       Family/ Staff Communication: observe the patient  Goals of Care: [place]  Labs/tests ordered: TSH 6 wks(05/21/14) US bladder post void residual.

## 2014-06-30 NOTE — Assessment & Plan Note (Signed)
TSH 0.245 05/20/14, decreased Levothyroxine to from , f/u TSH in 6 wks.

## 2014-06-30 NOTE — Assessment & Plan Note (Signed)
05/20/14 Na 139 06/26/14 Na 139  

## 2014-06-30 NOTE — Assessment & Plan Note (Signed)
Not new. Change Hydrocodone/ApAp 5/325mg bid for better pain control instead of prn.     

## 2014-06-30 NOTE — Assessment & Plan Note (Signed)
Left leg swelling, aches, feels heavy 06/06/14. 06/06/14 Duplex Venous US LLE: deep venous thrombosis at left common femoral and mid to proximal superficial femoral veins. Eliquis  bid x 7 days then  bid initiated-consulted with Dr. Chilton Si. Risk vs benefit discussed with the patient.

## 2014-06-30 NOTE — Assessment & Plan Note (Signed)
She stated she has seen Dr. Urology and wears Estrogen ring(to be changed q84m). C/o urinary hesitancy today. Will obtain bladder scan to evaluate post void residual. F/u Urology and inform Urology her recent CVA and LLE DVT. 06/29/14 urine culture 06/29/14 E.Coli 20,000c/ml-will treat with Nitrofurantoin  bid x 7 along with Florastor bid.

## 2014-06-30 NOTE — Assessment & Plan Note (Signed)
Better MiraLax 17gm +4 Oz water po bid.     

## 2014-07-01 LAB — TSH: TSH: 2.34 u[IU]/mL (ref 0.41–5.90)

## 2014-07-02 ENCOUNTER — Encounter: Payer: Self-pay | Admitting: Nurse Practitioner

## 2014-07-03 ENCOUNTER — Telehealth: Payer: Self-pay | Admitting: Neurology

## 2014-07-03 NOTE — Telephone Encounter (Signed)
Selfo from [facility] calling to schedule sooner appointment for patient, due to left arm numbness and tingling and foggy head that always occurs in the morning. Please call and advise.

## 2014-07-04 NOTE — Telephone Encounter (Signed)
Called and gave nursing home a sooner appointment

## 2014-07-04 NOTE — Telephone Encounter (Signed)
[facility] calling again to get sooner appointment, please return call and advise.

## 2014-07-09 ENCOUNTER — Ambulatory Visit: Payer: Medicare Other | Admitting: Neurology

## 2014-07-11 ENCOUNTER — Telehealth: Payer: Self-pay | Admitting: Neurology

## 2014-07-11 ENCOUNTER — Ambulatory Visit (INDEPENDENT_AMBULATORY_CARE_PROVIDER_SITE_OTHER): Payer: Medicare Other | Admitting: Neurology

## 2014-07-11 ENCOUNTER — Encounter: Payer: Self-pay | Admitting: Neurology

## 2014-07-11 ENCOUNTER — Encounter (INDEPENDENT_AMBULATORY_CARE_PROVIDER_SITE_OTHER): Payer: Self-pay

## 2014-07-11 VITALS — BP 173/79 | HR 63 | Wt 145.0 lb

## 2014-07-11 DIAGNOSIS — I63431 Cerebral infarction due to embolism of right posterior cerebral artery: Secondary | ICD-10-CM

## 2014-07-11 DIAGNOSIS — I6340 Cerebral infarction due to embolism of unspecified cerebral artery: Secondary | ICD-10-CM

## 2014-07-11 DIAGNOSIS — I82409 Acute embolism and thrombosis of unspecified deep veins of unspecified lower extremity: Secondary | ICD-10-CM

## 2014-07-11 DIAGNOSIS — E785 Hyperlipidemia, unspecified: Secondary | ICD-10-CM

## 2014-07-11 DIAGNOSIS — I6350 Cerebral infarction due to unspecified occlusion or stenosis of unspecified cerebral artery: Secondary | ICD-10-CM

## 2014-07-11 DIAGNOSIS — I82402 Acute embolism and thrombosis of unspecified deep veins of left lower extremity: Secondary | ICD-10-CM

## 2014-07-11 NOTE — Patient Instructions (Signed)
Pt had previous stroke in July and diagnosed with DVT one month ago, currently on eliquis and lipitor. She complains of head pressure and heaviness, sometimes nasuea but no vomiting, denies any HA, dizziness or mental status change. BP in clinic was 173/79 today but one month ago in clinic she was 123/72. She does not have any home meds for high BP.  - her symptoms could be due to HTN. We recommend to check BP at SNF once a day and add BP meds to treat for HTN. Goal BP 120-140/80-90. - please continue eliquis and lipitor for stroke prevention and for DVT treatment. - she also felt anxious in clinic today and she has ativan 0.5mg  bid. She has not taking it this morning dose. - continue stroke risk factor modifications - follow up in 3 months.

## 2014-07-11 NOTE — Telephone Encounter (Signed)
[facility] to [facility]  can be seen today @ 2 noon due to more issues. Talked to a nurse and she stated she will pas the message on and if this appointment does not work she will return my call.

## 2014-07-11 NOTE — Progress Notes (Signed)
STROKE NEUROLOGY FOLLOW UP NOTE  NAME: [name] DOB: May 17, 1922  REASON FOR VISIT: stroke follow up HISTORY FROM: pt and chart  Today we had the pleasure of seeing [name] in follow-up at our Neurology Clinic. Pt was accompanied by nurse from NH.   History Summary 80 yo white F with PMH of CVA, HLD, left hip dislocation was admitted 05/07/14 due to left hemiparesis and left hemiparesthesia. MRI showed moderately large right PCA territory infarct including right thalamus. Felt to be thrombotic, started on ASA  and discharged with ASA and lipitor. Her left hip dislocation was treated in ER and on discharge she still has 4/5 left leg weakness and left arm and leg numbness with left hemianopia. She was sent to SNF.   06/09/14 follow up - the patient has been doing fair. However, she was found to have left femoral vein DVT on 8/21 with venous doppler due to left leg swelling and pain. She was put on eliquis this morning. She still has significant swelling of left leg now and pt complains of heaviness and numbness of left leg. She still complain of left arm numbness, no weakness from her stroke one month ago. She still has visual field problem on the left also due to stroke last month. She currently can use walker to walk at [facility].  Interval History During the interval time, she was found to have depressed mood, refusing to have meals and refusing to come out of room. She was started on zoloft 06/27/14 and now on  daily. She also complained head pressure at frontal area, heaviness feeling, not HA, dizziness but feeling odd and sometimes causing nauseas but no vomiting. Today in clinic her BP 173/79, but one month ago in clinic it was 123/72.   She still complains of comes and goes left arm > leg numbness, and sometimes fear of falling on walking but if she uses walker to walk, it will be OK.  REVIEW OF SYSTEMS: Full 14 system review of systems performed and notable only for  those listed below and in HPI above, all others are negative:  Constitutional: N/A  Cardiovascular: leg swelling  Ear/Nose/Throat: N/A  Skin: N/A  Eyes: N/A  Respiratory: N/A  Gastroitestinal: N/A  Genitourinary: N/A Hematology/Lymphatic: N/A  Endocrine: N/A  Musculoskeletal: back pain  Allergy/Immunology: N/A  Neurological: numbness, walking difficulty  Psychiatric: nervous anxious  The following represents the patient's updated allergies and side effects list: Allergies  Allergen Reactions  . Augmentin [Amoxicillin-Pot Clavulanate] Nausea Only    Labs since last visit of relevance include the following: Results for orders placed in visit on 06/30/14  CBC AND DIFFERENTIAL      Result Value Ref Range   Hemoglobin 11.5 (*) 12.0 - 16.0 g/dL   HCT 35 (*) 36 - 46 %   Platelets 268  150 - 399 K/L   WBC 4.3    BASIC METABOLIC PANEL      Result Value Ref Range   Glucose 81     BUN 10  4 - 21 mg/dL   Creatinine 0.6  0.5 - 1.1 mg/dL   Potassium 3.9  3.4 - 5.3 mmol/L   Sodium 130 (*) 137 - 147 mmol/L    The neurologically relevant items on the patient's problem list were reviewed on today's visit.  Neurologic Examination  A problem focused neurological exam (12 or more points of the single system neurologic examination, vital signs counts as 1 point, cranial nerves count for  8 points) was performed.  Blood pressure 173/79, pulse 63, weight 145 lb (65.772 kg).  General - Well nourished, well developed, in no apparent distress.  Ophthalmologic - not able to see through.  Cardiovascular - Regular rate and rhythm with no murmur.  Extremities - Left whole leg still swelling, but no tenderness on touch  Mental Status -  Level of arousal and orientation to time, place, and person were intact. Language including expression, naming, repetition, comprehension was assessed and found intact.  Cranial Nerves II - XII - II - left upper quadranopia and partial left lower  quadrianopia. III, IV, VI - Extraocular movements intact. V - Facial sensation intact bilaterally. VII - Facial movement intact bilaterally. VIII - hard of hearing but vestibular intact bilaterally. X - Palate elevates symmetrically. XI - Chin turning & shoulder shrug intact bilaterally. XII - Tongue protrusion intact.  Motor Strength - The patient's strength was normal in all extremities except LLE 4/5 proximal and distally and pronator drift was absent.  Bulk was normal and fasciculations were absent.   Motor Tone - Muscle tone was assessed at the neck and appendages and was normal.  Reflexes - The patient's reflexes were normal in all extremities and she had no pathological reflexes.  Sensory - Light touch, temperature/pinprick was symmetrical bilaterally.    Coordination - The patient had normal movements in the hands with no ataxia or dysmetria.  Tremor was absent.  Gait and Station - not tested, in wheelchair.  Data reviewed: I personally reviewed the images and agree with the radiology interpretations.  Ct Head Wo Contrast  05/07/2014  Probable acute to subacute right occipital infarct without evidence of hemorrhage. Wallace Cullens matter involvement is not well demonstrated raising the question that this could be related to vasogenic edema. MRI could be used to further evaluate given the possibility that this is related to edema and not ischemia, intravenous contrast material is recommended if that study is performed.  Ct Cervical Spine Wo Contrast  05/07/2014  Degenerative changes in the cervical spine without evidence of an acute fracture.  Mr [name] Wo Contrast  05/08/2014  Acute, moderately large right PCA territory infarct. No hemorrhage.  Mr [name] Head/brain Wo Cm  05/08/2014  Fetal type origins of the PCAs with occlusion of the proximal P2 segment on the right.  Intracranial atherosclerosis with severe stenoses of the proximal basilar artery and left P2 segment and mild to moderate  stenoses of the distal left vertebral artery, left P2 segment, and left MCA bifurcation.  Carotid Doppler Bilateral: Mild calcified plaque origin ICA. 1-39% ICA stenosis. Vertbral artery flow is antegrade.  2D Echocardiogram  The estimated ejection fraction was in the range of 60% to 65%. - Aortic valve: There was mild to moderate stenosis. There was mild regurgitation. Valve area (VTI): 0.8 cm^2. Valve area (Vmax): 0.76 cm^2. - Mitral valve: There was mild regurgitation. - Atrial septum: No defect or patent foramen ovale was identified.  Duplex Venous US LLE 06/06/14 : deep venous thrombosis at left common femoral and mid to proximal superficial femoral veins. Eliquis  bid x 7 days then  bid EKG - Normal sinus rhythm rate 79 beats per minute. Possible Inferior infarct , age undetermined Cannot rule out Anterior infarct , age undetermined. T wave abnormality, consider lateral ischemia   Component     Latest Ref Rng 05/08/2014 05/20/2014  Cholesterol     0 - 200 mg/dL 161   Triglycerides     <150 mg/dL 86  HDL     >39 mg/dL 55   Total CHOL/HDL Ratio      3.6   VLDL     0 - 40 mg/dL 17   LDL (calc)     0 - 99 mg/dL 161 (H)   Hemoglobin W9U     <5.7 % 5.8 (H)   Mean Plasma Glucose     <117 mg/dL 045 (H)   TSH     4.09 - 5.90 uIU/mL 1.590 0.24 (A)    Assessment: As you may recall, she is a 80 y.o. Caucasian female with PMH of CVA, HLD, left hip dislocation admitted in 04/2014 for right PCA stroke due to left sided weaknss, numbness and hemianopia. Typical for PCA stroke. On ASA initially but found to have DVT on 8/21 venous doppler. She was then put on eliquis for DVT. Still has significant left leg swelling, and some numbness of left arm and leg. She also complains of head heaviness but found to have high BP.   Plan:  - continue eliquis for DVT treatment and stroke prevention - continue statin for stroke prevention - need better BP control since she is on anticoagulation. Check  BP at least daily. Goal < 140 - follow up with PCP for risk factor modification. - continue PT/OT - continue zoloft for depression. - RTC in 3 months.  No orders of the defined types were placed in this encounter.    Patient Instructions  Pt had previous stroke in July and diagnosed with DVT one month ago, currently on eliquis and lipitor. She complains of head pressure and heaviness, sometimes nasuea but no vomiting, denies any HA, dizziness or mental status change. BP in clinic was 173/79 today but one month ago in clinic she was 123/72. She does not have any home meds for high BP.  - her symptoms could be due to HTN. We recommend to check BP at SNF once a day and add BP meds to treat for HTN. Goal BP 120-140/80-90. - please continue eliquis and lipitor for stroke prevention and for DVT treatment. - she also felt anxious in clinic today and she has ativan 0.5mg  bid. She has not taking it this morning dose. - continue stroke risk factor modifications - follow up in 3 months.    Marvel Plan, MD PhD [facility] [address] [place], [place] [address] [phone]

## 2014-07-11 NOTE — Telephone Encounter (Signed)
Danielle at [facility] wanting to schedule sooner appointment for patient with Dr. Roda Shutters due to more issues and patient being dizzy, please return call and advise.

## 2014-07-16 ENCOUNTER — Other Ambulatory Visit: Payer: Self-pay | Admitting: *Deleted

## 2014-07-16 MED ORDER — HYDROCODONE-ACETAMINOPHEN 5-325 MG PO TABS: ORAL_TABLET | ORAL | Status: DC

## 2014-07-16 NOTE — Telephone Encounter (Signed)
[facility] fax order 

## 2014-07-23 ENCOUNTER — Other Ambulatory Visit: Payer: Self-pay | Admitting: *Deleted

## 2014-07-23 MED ORDER — LORAZEPAM 0.5 MG PO TABS: ORAL_TABLET | ORAL | Status: DC

## 2014-07-23 NOTE — Telephone Encounter (Signed)
[facility] Fax Order 

## 2014-08-06 ENCOUNTER — Encounter: Payer: Self-pay | Admitting: Nurse Practitioner

## 2014-08-06 ENCOUNTER — Non-Acute Institutional Stay (SKILLED_NURSING_FACILITY): Payer: Medicare Other | Admitting: Nurse Practitioner

## 2014-08-06 ENCOUNTER — Telehealth: Payer: Self-pay | Admitting: Neurology

## 2014-08-06 DIAGNOSIS — M545 Low back pain: Secondary | ICD-10-CM

## 2014-08-06 DIAGNOSIS — R3911 Hesitancy of micturition: Secondary | ICD-10-CM

## 2014-08-06 DIAGNOSIS — E039 Hypothyroidism, unspecified: Secondary | ICD-10-CM

## 2014-08-06 DIAGNOSIS — G8929 Other chronic pain: Secondary | ICD-10-CM

## 2014-08-06 DIAGNOSIS — F419 Anxiety disorder, unspecified: Secondary | ICD-10-CM

## 2014-08-06 DIAGNOSIS — I82402 Acute embolism and thrombosis of unspecified deep veins of left lower extremity: Secondary | ICD-10-CM

## 2014-08-06 DIAGNOSIS — E871 Hypo-osmolality and hyponatremia: Secondary | ICD-10-CM

## 2014-08-06 NOTE — Assessment & Plan Note (Signed)
Takes Ativan 0.5mg bid.    

## 2014-08-06 NOTE — Assessment & Plan Note (Signed)
She stated she has seen Dr. Urology and wears Estrogen ring(to be changed q3892m)-dc'd due to recent CVA and LLE DVT. Urinary hesitancy was resolved after treat with Nitrofurantoin 100mg  bid x 7 along with Florastor bid.  06/30/14 US bladder: no significant abnormality evident, but initially only partially distended urinary bladder limiting evaluation. Post void residual 29ml.

## 2014-08-06 NOTE — Telephone Encounter (Signed)
[name] calling to request sooner appointment for patient due to dizziness and having foggy headed feelings, please return call and advise.

## 2014-08-06 NOTE — Assessment & Plan Note (Signed)
05/20/14 Na 139 06/26/14 Na 139

## 2014-08-06 NOTE — Telephone Encounter (Signed)
Called patient, no answer, will try again later.

## 2014-08-06 NOTE — Assessment & Plan Note (Signed)
Not new. Change Hydrocodone/ApAp 5/325mg bid for better pain control instead of prn.     

## 2014-08-06 NOTE — Assessment & Plan Note (Signed)
/  o LUE and LLE numbness. Muscle strength 5/5  05/08/14 MRI brain:  IMPRESSION:  1. Acute, moderately large right PCA territory infarct. No  hemorrhage.  2. Fetal type origins of the PCAs with occlusion of the proximal P2  segment on the right.  3. Intracranial atherosclerosis with severe stenoses of the proximal  basilar artery and left P2 segment and mild to moderate stenoses of  the distal left vertebral artery, left P2 segment, and left MCA  bifurcation.  dc'd Plavix 06/06/14, started Eliquis for new onset DVT LLE in setting of recent CVA and continued Zocor for risk reduction.

## 2014-08-06 NOTE — Assessment & Plan Note (Addendum)
Left leg swelling, aches, feels heavy 06/06/14. 06/06/14 Duplex Venous US LLE: deep venous thrombosis at left common femoral and mid to proximal superficial femoral veins. Continue Eliqui 5mg  bid  07/01/14 Urology removed Estrogen ring

## 2014-08-06 NOTE — Assessment & Plan Note (Signed)
05/20/14 TSH 0.245 07/01/14 TSH 2.366 08/06/14 continue Levothyroxine 75mcg

## 2014-08-06 NOTE — Progress Notes (Signed)
Patient ID: [name], female   DOB: 1922-07-07, 80 y.o.   MRN: [id number]   Code Status: DNR  Allergies  Allergen Reactions  . Augmentin [Amoxicillin-Pot Clavulanate] Nausea Only    Chief Complaint  Patient presents with  . Medical Management of Chronic Issues    HPI: Patient is a 80 y.o. female seen in the SNF at [facility] today for evaluation of chronic medical conditions.   Hospitalized from 05/07/2014-05/10/2014  For CVA (cerebral infarction) with mild left sided weakness and Dislocation of the left prosthesis-spontaneous dislocation of her left hip was reduced by orthopedics and anesthesia.  Head CT obtained shows probable acute to subacute right occipital infarct without evidence of hemorrhage however no significant grave matter of all meds with a question of vasogenic edema. Degenerative changes were noted on the cervical spine. Hip x-ray within normal limits.   Problem List Items Addressed This Visit   Urinary hesitancy     She stated she has seen Dr. Urology and wears Estrogen ring(to be changed q77m)-dc'd due to recent CVA and LLE DVT. Urinary hesitancy was resolved after treat with Nitrofurantoin 100mg  bid x 7 along with Florastor bid.  06/30/14 US bladder: no significant abnormality evident, but initially only partially distended urinary bladder limiting evaluation. Post void residual 29ml.     Hypothyroidism - Primary     05/20/14 TSH 0.245 07/01/14 TSH 2.366 08/06/14 continue Levothyroxine     Hyponatremia     05/20/14 Na 139 06/26/14 Na 139     Chronic low back pain     Not new. Change Hydrocodone/ApAp 5/325mg  bid for better pain control instead of prn.        Anxiety     Takes Ativan 0.5mg  bid.       Acute DVT (deep venous thrombosis)     Left leg swelling, aches, feels heavy 06/06/14. 06/06/14 Duplex Venous US LLE: deep venous thrombosis at left common femoral and mid to proximal superficial femoral veins. Continue Eliqui 5mg  bid  07/01/14  Urology removed Estrogen ring        Review of Systems: Review of Systems  Constitutional: Negative for fever, chills, weight loss, malaise/fatigue and diaphoresis.  HENT: Positive for hearing loss. Negative for congestion, ear discharge, ear pain, nosebleeds, sore throat and tinnitus.   Eyes: Positive for blurred vision. Negative for double vision, photophobia, pain, discharge and redness.       Left eye  Respiratory: Negative for cough, hemoptysis, sputum production, shortness of breath, wheezing and stridor.   Cardiovascular: Positive for leg swelling. Negative for chest pain, palpitations, orthopnea, claudication and PND.       LLE swelling and aches.   Gastrointestinal: Negative for heartburn, nausea, vomiting, abdominal pain, diarrhea, constipation, blood in stool and melena.  Genitourinary: Positive for frequency. Negative for dysuria, urgency, hematuria and flank pain.       New hesitancy  Musculoskeletal: Positive for back pain. Negative for falls, joint pain, myalgias and neck pain.       Chronic  Skin: Negative for itching and rash.  Neurological: Positive for sensory change and focal weakness. Negative for dizziness, tingling, tremors, speech change, seizures, loss of consciousness, weakness and headaches.       Numbness and paraesthesia LUE and LLE. Left sided weakness even if grip strength 5/5  Endo/Heme/Allergies: Negative for environmental allergies and polydipsia. Does not bruise/bleed easily.  Psychiatric/Behavioral: Positive for depression. Negative for suicidal ideas, hallucinations, memory loss and substance abuse. The patient is nervous/anxious. The  patient does not have insomnia.        Foggy head-better since Zoloft started 06/26/14 and Zyrtec hold 06/26/14     Past Medical History  Diagnosis Date  . Deviated nasal septum   . Dysfunction of eustachian tube   . Unspecified sinusitis (chronic)   . Allergic rhinitis, cause unspecified   . Arthritis   . Cataract     . Senile osteoporosis   . Hx of syncope     had loop recorder for 2 years now explanted, only PACs noted on device for over 2 years, no a fib, Echo 08/18/09 EF >55%mild concentric LVHnormal myoview, normal carotid dopplers 2010  . Pruritus of skin     poss. related to Losartan  . Hyperlipidemia   . Normal cardiac stress test     lexiscan myoview 04/08/08  . CVA (cerebral infarction) 2015  . Cerebral atrophy 2015  . Cerebrovascular disease 2015  . Dislocation of hip prosthesis 2015  . Lumbago 2015  . Unspecified hypothyroidism 2015  . Anxiety 2015  . Hyponatremia 2015  . Cervical spine degeneration 2015   Past Surgical History  Procedure Laterality Date  . Tonsillectomy    . Total hip arthroplasty      bilateral  . Pelvic sling    . Joint replacement    . Loop recorder implant  07/2009    to eval syncope  . Loop recorder explant  01/12/2012   Social History:   reports that she has never smoked. She does not have any smokeless tobacco history on file. She reports that she does not drink alcohol or use illicit drugs.  Family History  Problem Relation Age of Onset  . Cancer Father     Medications: Patient's Medications  New Prescriptions   No medications on file  Previous Medications   APIXABAN (ELIQUIS) 5 MG TABS TABLET    Take 5 mg by mouth 2 (two) times daily.   ATORVASTATIN (LIPITOR) 10 MG TABLET    Take 10 mg by mouth daily.   CETIRIZINE (ZYRTEC) 10 MG TABLET    Take 10 mg by mouth daily.   HYDROCODONE-ACETAMINOPHEN (NORCO/VICODIN) 5-325 MG PER TABLET    Take one tablet by mouth twice daily for pain   LEVOTHYROXINE (SYNTHROID, LEVOTHROID) 50 MCG TABLET    Take 50 mcg by mouth daily before breakfast.   LORAZEPAM (ATIVAN) 0.5 MG TABLET    Take one tablet by mouth twice daily at 8am and 10pm for anxiety   LORAZEPAM (ATIVAN) 1 MG TABLET    Take 0.5 tablets (0.5 mg total) by mouth 2 (two) times daily.   MECLIZINE (ANTIVERT) 25 MG TABLET    Take 25 mg by mouth 3 (three)  times daily as needed for dizziness.   MONTELUKAST (SINGULAIR) 10 MG TABLET    Take 10 mg by mouth at bedtime.   POLYETHYLENE GLYCOL (MIRALAX / GLYCOLAX) PACKET    Take 17 g by mouth 2 (two) times daily.  Modified Medications   No medications on file  Discontinued Medications   No medications on file     Physical Exam:  Physical Exam  Constitutional: She is oriented to person, place, and time. She appears well-developed and well-nourished. No distress.  HENT:  Head: Normocephalic and atraumatic.  Right Ear: External ear normal.  Left Ear: External ear normal.  Nose: Nose normal.  Mouth/Throat: Oropharynx is clear and moist. No oropharyngeal exudate.  Eyes: Conjunctivae and EOM are normal. Pupils are equal, round, and reactive to  light. Right eye exhibits no discharge. Left eye exhibits no discharge. No scleral icterus.  Neck: Normal range of motion. Neck supple. No JVD present. No tracheal deviation present. No thyromegaly present.  Cardiovascular: Normal rate and regular rhythm.  Exam reveals no gallop and no friction rub.   Murmur heard. 4/6  Pulmonary/Chest: Effort normal and breath sounds normal. No stridor. No respiratory distress. She has no wheezes. She has no rales. She exhibits no tenderness.  Abdominal: Soft. Bowel sounds are normal. She exhibits no distension and no mass. There is no tenderness. There is no rebound and no guarding.  Musculoskeletal: Normal range of motion. She exhibits edema. She exhibits no tenderness.  Chronic back pain. LLE swelling 1+ and aches   Lymphadenopathy:    She has no cervical adenopathy.  Neurological: She is alert and oriented to person, place, and time. She has normal reflexes. No cranial nerve deficit. She exhibits normal muscle tone. Coordination normal.  Slight weakness LUE and LLE  Skin: Skin is warm and dry. No rash noted. She is not diaphoretic. No erythema. No pallor.  Psychiatric: She has a normal mood and affect. Her behavior is  normal. Judgment and thought content normal.    Filed Vitals:   08/06/14 1101  BP: 122/58  Pulse: 72  Temp: 98.9 F (37.2 C)  TempSrc: Tympanic  Resp: 18      Labs reviewed: Basic Metabolic Panel:  Recent Labs  16/10/96 0033 05/07/14 1720 05/07/14 2046  05/09/14 0443 05/12/14 05/20/14 06/26/14 07/01/14  NA 137 136* 134*  --  138 132* 139 130*  --   K 4.0 4.4 4.2  --  4.0 4.0 4.2 3.9  --   CL 100 97 102  --  102  --   --   --   --   CO2  --  23  --   --  23  --   --   --   --   GLUCOSE 104* 98 107*  --  100*  --   --   --   --   BUN 14 13 14   --  12 14 12 10   --   CREATININE 0.80 0.65 0.60  --  0.68 0.7 0.7 0.6  --   CALCIUM  --  9.2  --   --  8.6  --   --   --   --   TSH  --   --   --   < >  --   --  0.24*  --  2.34  < > = values in this interval not displayed. Liver Function Tests: No results found for this basename: AST, ALT, ALKPHOS, BILITOT, PROT, ALBUMIN,  in the last 8760 hours No results found for this basename: LIPASE, AMYLASE,  in the last 8760 hours No results found for this basename: AMMONIA,  in the last 8760 hours CBC:  Recent Labs  05/06/14 0025  05/07/14 1720  05/09/14 0443 05/12/14 06/26/14  WBC 5.5  --  5.4  --  5.1 5.1 4.3  HGB 13.3  < > 14.4  < > 12.4 13.1 11.5*  HCT 39.0  < > 43.4  < > 37.6 38 35*  MCV 87.1  --  90.2  --  88.5  --   --   PLT 224  --  256  --  220 260 268  < > = values in this interval not displayed. Lipid Panel:  Recent Labs  05/08/14 0540  CHOL 198  HDL 55  LDLCALC 126*  TRIG 86  CHOLHDL 3.6    Past Procedures:  05/06/14 X-ray Pelvis:  IMPRESSION:  Successful reduction of previously noted left hip prosthesis  dislocation. No evidence of fracture or loosening.    05/07/14 CT head and cervical spine w/o contrast:  IMPRESSION:  Probable acute to subacute right occipital infarct without evidence  of hemorrhage. Wallace CullensGray matter involvement is not well demonstrated  raising the question that this could be related  to vasogenic edema.  Degenerative changes in the cervical spine without evidence of an  acute fracture.   05/08/14 MRI brain:  IMPRESSION:  1. Acute, moderately large right PCA territory infarct. No  hemorrhage.  2. Fetal type origins of the PCAs with occlusion of the proximal P2  segment on the right.  3. Intracranial atherosclerosis with severe stenoses of the proximal  basilar artery and left P2 segment and mild to moderate stenoses of  the distal left vertebral artery, left P2 segment, and left MCA  bifurcation.  05/08/14 Echocardiogram:  Study Conclusions  - Left ventricle: The cavity size was normal. Wall thickness was increased in a pattern of moderate LVH. Systolic function was normal. The estimated ejection fraction was in the range of 60% to 65%. - Aortic valve: There was mild to moderate stenosis. There was mild regurgitation - Mitral valve: There was mild regurgitation.  06/06/14 Duplex Venous US LLE: deep venous thrombosis at left common femoral and mid to proximal superficial femoral veins.    Assessment/Plan Urinary hesitancy She stated she has seen Dr. Urology and wears Estrogen ring(to be changed q8199m)-dc'd due to recent CVA and LLE DVT. Urinary hesitancy was resolved after treat with Nitrofurantoin 100mg  bid x 7 along with Florastor bid.  06/30/14 US bladder: no significant abnormality evident, but initially only partially distended urinary bladder limiting evaluation. Post void residual 29ml.   Hypothyroidism 05/20/14 TSH 0.245 07/01/14 TSH 2.366 08/06/14 continue Levothyroxine 75mcg   Hyponatremia 05/20/14 Na 139 06/26/14 Na 139   CVA (cerebral infarction) /o LUE and LLE numbness. Muscle strength 5/5  05/08/14 MRI brain:  IMPRESSION:  1. Acute, moderately large right PCA territory infarct. No  hemorrhage.  2. Fetal type origins of the PCAs with occlusion of the proximal P2  segment on the right.  3. Intracranial atherosclerosis with severe stenoses of  the proximal  basilar artery and left P2 segment and mild to moderate stenoses of  the distal left vertebral artery, left P2 segment, and left MCA  bifurcation.  dc'd Plavix 06/06/14, started Eliquis for new onset DVT LLE in setting of recent CVA and continued Zocor for risk reduction.     Chronic low back pain Not new. Change Hydrocodone/ApAp 5/325mg  bid for better pain control instead of prn.      Anxiety Takes Ativan 0.5mg  bid.     Acute DVT (deep venous thrombosis) Left leg swelling, aches, feels heavy 06/06/14. 06/06/14 Duplex Venous US LLE: deep venous thrombosis at left common femoral and mid to proximal superficial femoral veins. Continue Eliqui 5mg  bid  07/01/14 Urology removed Estrogen ring     Family/ Staff Communication: observe the patient  Goals of Care: [place]  Labs/tests ordered: none

## 2014-08-07 NOTE — Telephone Encounter (Signed)
[name] BrowCynthia [name] with [facility] called and stated patient will keep appointment on 12/1 with Dr. Roda ShuttersXu.

## 2014-08-19 ENCOUNTER — Other Ambulatory Visit: Payer: Self-pay | Admitting: *Deleted

## 2014-08-19 MED ORDER — LORAZEPAM 0.5 MG PO TABS: ORAL_TABLET | ORAL | Status: DC

## 2014-08-19 NOTE — Telephone Encounter (Signed)
[facility] 

## 2014-08-27 ENCOUNTER — Non-Acute Institutional Stay (SKILLED_NURSING_FACILITY): Payer: Medicare Other | Admitting: Nurse Practitioner

## 2014-08-27 ENCOUNTER — Encounter: Payer: Self-pay | Admitting: Nurse Practitioner

## 2014-08-27 DIAGNOSIS — R3911 Hesitancy of micturition: Secondary | ICD-10-CM

## 2014-08-27 DIAGNOSIS — M545 Low back pain, unspecified: Secondary | ICD-10-CM

## 2014-08-27 DIAGNOSIS — K5900 Constipation, unspecified: Secondary | ICD-10-CM

## 2014-08-27 DIAGNOSIS — G8929 Other chronic pain: Secondary | ICD-10-CM

## 2014-08-27 DIAGNOSIS — I82402 Acute embolism and thrombosis of unspecified deep veins of left lower extremity: Secondary | ICD-10-CM

## 2014-08-27 DIAGNOSIS — E871 Hypo-osmolality and hyponatremia: Secondary | ICD-10-CM

## 2014-08-27 DIAGNOSIS — F419 Anxiety disorder, unspecified: Secondary | ICD-10-CM

## 2014-08-27 NOTE — Progress Notes (Signed)
Patient ID: [name], female   DOB: [birth year], [age] y.o.   MRN: [id number]   Code Status: DNR  Allergies  Allergen Reactions  . Augmentin [Amoxicillin-Pot Clavulanate] Nausea Only    Chief Complaint  Patient presents with  . Medical Management of Chronic Issues    HPI: Patient is a 80 y.o. female [facility] for evaluation of chronic medical conditions.   Hospitalized from 05/07/2014-05/10/2014  For CVA (cerebral infarction) with mild left sided weakness and Dislocation of the left prosthesis-spontaneous dislocation of her left hip was reduced by orthopedics and anesthesia.  Head CT obtained shows probable acute to subacute right occipital infarct without evidence of hemorrhage however no significant grave matter of all meds with a question of vasogenic edema. Degenerative changes were noted on the cervical spine. Hip x-ray within normal limits.   Problem List Items Addressed This Visit    Urinary hesitancy    06/30/14 US bladder: no significant abnormality evident, but initially only partially distended urinary bladder limiting evaluation. Post void residual 29ml.  She stated she has seen Dr. Urology and wears Estrogen ring(to be changed q9167m)-dc'd due to recent CVA and LLE DVT. Urinary hesitancy was resolved after treat with Nitrofurantoin 100mg  bid x 7 along with Florastor bid.  08/28/14 c/o worsened urinary frequency-UA C/S to r/o UTI     Hyponatremia    05/20/14 Na 139 06/26/14 Na 139      Constipation    Better MiraLax 17gm +4 Oz water po bid.        Chronic low back pain - Primary    Not new. Change Hydrocodone/ApAp 5/325mg  bid for better pain control instead of prn.        Anxiety    Takes Ativan 0.5mg  bid.       Acute DVT (deep venous thrombosis)    Left leg swelling, aches, feels heavy 06/06/14. 06/06/14 Duplex Venous US LLE: deep venous thrombosis at left common femoral and mid to proximal superficial femoral veins.  Continue Eliqui 5mg  bid  07/01/14 Urology removed Estrogen ring 08/27/14 less edematous LLE         Review of Systems: Review of Systems  Constitutional: Negative for fever, chills, weight loss, malaise/fatigue and diaphoresis.  HENT: Positive for hearing loss. Negative for congestion, ear discharge, ear pain, nosebleeds, sore throat and tinnitus.   Eyes: Positive for blurred vision. Negative for double vision, photophobia, pain, discharge and redness.       Left eye  Respiratory: Negative for cough, hemoptysis, sputum production, shortness of breath, wheezing and stridor.   Cardiovascular: Positive for leg swelling. Negative for chest pain, palpitations, orthopnea, claudication and PND.       LLE swelling and aches.   Gastrointestinal: Negative for heartburn, nausea, vomiting, abdominal pain, diarrhea, constipation, blood in stool and melena.  Genitourinary: Positive for frequency. Negative for dysuria, urgency, hematuria and flank pain.       New hesitancy  Musculoskeletal: Positive for back pain. Negative for myalgias, joint pain, falls and neck pain.       Chronic  Skin: Negative for itching and rash.  Neurological: Positive for sensory change and focal weakness. Negative for dizziness, tingling, tremors, speech change, seizures, loss of consciousness, weakness and headaches.       Numbness and paraesthesia LUE and LLE. Left sided weakness even if grip strength 5/5  Endo/Heme/Allergies: Negative for environmental allergies and polydipsia. Does not bruise/bleed easily.  Psychiatric/Behavioral: Positive for depression. Negative for suicidal  ideas, hallucinations, memory loss and substance abuse. The patient is nervous/anxious. The patient does not have insomnia.        Foggy head-better since Zoloft started 06/26/14 and Zyrtec hold 06/26/14     Past Medical History  Diagnosis Date  . Deviated nasal septum   . Dysfunction of eustachian tube   . Unspecified sinusitis (chronic)   .  Allergic rhinitis, cause unspecified   . Arthritis   . Cataract   . Senile osteoporosis   . Hx of syncope     had loop recorder for 2 years now explanted, only PACs noted on device for over 2 years, no a fib, Echo 08/18/09 EF >55%mild concentric LVHnormal myoview, normal carotid dopplers 2010  . Pruritus of skin     poss. related to Losartan  . Hyperlipidemia   . Normal cardiac stress test     lexiscan myoview 04/08/08  . CVA (cerebral infarction) 2015  . Cerebral atrophy 2015  . Cerebrovascular disease 2015  . Dislocation of hip prosthesis 2015  . Lumbago 2015  . Unspecified hypothyroidism 2015  . Anxiety 2015  . Hyponatremia 2015  . Cervical spine degeneration 2015   Past Surgical History  Procedure Laterality Date  . Tonsillectomy    . Total hip arthroplasty      bilateral  . Pelvic sling    . Joint replacement    . Loop recorder implant  07/2009    to eval syncope  . Loop recorder explant  01/12/2012   Social History:   reports that she has never smoked. She does not have any smokeless tobacco history on file. She reports that she does not drink alcohol or use illicit drugs.  Family History  Problem Relation Age of Onset  . Cancer Father     Medications: Patient's Medications  New Prescriptions   No medications on file  Previous Medications   APIXABAN (ELIQUIS) 5 MG TABS TABLET    Take 5 mg by mouth 2 (two) times daily.   ATORVASTATIN (LIPITOR) 10 MG TABLET    Take 10 mg by mouth daily.   CETIRIZINE (ZYRTEC) 10 MG TABLET    Take 10 mg by mouth daily.   HYDROCODONE-ACETAMINOPHEN (NORCO/VICODIN) 5-325 MG PER TABLET    Take one tablet by mouth twice daily for pain   LEVOTHYROXINE (SYNTHROID, LEVOTHROID) 50 MCG TABLET    Take 50 mcg by mouth daily before breakfast.   LORAZEPAM (ATIVAN) 0.5 MG TABLET    Take one tablet by mouth twice daily at 8am and 10pm for anxiety   LORAZEPAM (ATIVAN) 1 MG TABLET    Take 0.5 tablets (0.5 mg total) by mouth 2 (two) times daily.    MECLIZINE (ANTIVERT) 25 MG TABLET    Take 25 mg by mouth 3 (three) times daily as needed for dizziness.   MONTELUKAST (SINGULAIR) 10 MG TABLET    Take 10 mg by mouth at bedtime.   POLYETHYLENE GLYCOL (MIRALAX / GLYCOLAX) PACKET    Take 17 g by mouth 2 (two) times daily.  Modified Medications   No medications on file  Discontinued Medications   No medications on file     Physical Exam:  Physical Exam  Constitutional: She is oriented to person, place, and time. She appears well-developed and well-nourished. No distress.  HENT:  Head: Normocephalic and atraumatic.  Right Ear: External ear normal.  Left Ear: External ear normal.  Nose: Nose normal.  Mouth/Throat: Oropharynx is clear and moist. No oropharyngeal exudate.  Eyes: Conjunctivae  and EOM are normal. Pupils are equal, round, and reactive to light. Right eye exhibits no discharge. Left eye exhibits no discharge. No scleral icterus.  Neck: Normal range of motion. Neck supple. No JVD present. No tracheal deviation present. No thyromegaly present.  Cardiovascular: Normal rate and regular rhythm.  Exam reveals no gallop and no friction rub.   Murmur heard. 4/6  Pulmonary/Chest: Effort normal and breath sounds normal. No stridor. No respiratory distress. She has no wheezes. She has no rales. She exhibits no tenderness.  Abdominal: Soft. Bowel sounds are normal. She exhibits no distension and no mass. There is no tenderness. There is no rebound and no guarding.  Musculoskeletal: Normal range of motion. She exhibits edema. She exhibits no tenderness.  Chronic back pain. LLE swelling 1+ and aches   Lymphadenopathy:    She has no cervical adenopathy.  Neurological: She is alert and oriented to person, place, and time. She has normal reflexes. No cranial nerve deficit. She exhibits normal muscle tone. Coordination abnormal.  Slight weakness LUE and LLE  Skin: Skin is warm and dry. No rash noted. She is not diaphoretic. No erythema. No  pallor.  Psychiatric: She has a normal mood and affect. Her behavior is normal. Judgment and thought content normal.    Filed Vitals:   08/27/14 1523  BP: 147/80  Pulse: 55  Temp: 97.8 F (36.6 C)  TempSrc: Tympanic  Resp: 18      Labs reviewed: Basic Metabolic Panel:  Recent Labs  13/05/6506/22/15 1720 05/07/14 2046 05/08/14 1440 05/09/14 0443 05/12/14 05/20/14 06/26/14 07/01/14  NA 136* 134*  --  138 132* 139 130*  --   K 4.4 4.2  --  4.0 4.0 4.2 3.9  --   CL 97 102  --  102  --   --   --   --   CO2 23  --   --  23  --   --   --   --   GLUCOSE 98 107*  --  100*  --   --   --   --   BUN 13 14  --  12 14 12 10   --   CREATININE 0.65 0.60  --  0.68 0.7 0.7 0.6  --   CALCIUM 9.2  --   --  8.6  --   --   --   --   TSH  --   --  1.590  --   --  0.24*  --  2.34   Liver Function Tests: No results for input(s): AST, ALT, ALKPHOS, BILITOT, PROT, ALBUMIN in the last 8760 hours. No results for input(s): LIPASE, AMYLASE in the last 8760 hours. No results for input(s): AMMONIA in the last 8760 hours. CBC:  Recent Labs  05/06/14 0025  05/07/14 1720  05/09/14 0443 05/12/14 06/26/14  WBC 5.5  --  5.4  --  5.1 5.1 4.3  HGB 13.3  < > 14.4  < > 12.4 13.1 11.5*  HCT 39.0  < > 43.4  < > 37.6 38 35*  MCV 87.1  --  90.2  --  88.5  --   --   PLT 224  --  256  --  220 260 268  < > = values in this interval not displayed. Lipid Panel:  Recent Labs  05/08/14 0540  CHOL 198  HDL 55  LDLCALC 126*  TRIG 86  CHOLHDL 3.6    Past Procedures:  05/06/14 X-ray Pelvis:  IMPRESSION:  Successful reduction of previously noted left hip prosthesis  dislocation. No evidence of fracture or loosening.    05/07/14 CT head and cervical spine w/o contrast:  IMPRESSION:  Probable acute to subacute right occipital infarct without evidence  of hemorrhage. Wallace Cullens matter involvement is not well demonstrated  raising the question that this could be related to vasogenic edema.  Degenerative changes in  the cervical spine without evidence of an  acute fracture.   05/08/14 MRI brain:  IMPRESSION:  1. Acute, moderately large right PCA territory infarct. No  hemorrhage.  2. Fetal type origins of the PCAs with occlusion of the proximal P2  segment on the right.  3. Intracranial atherosclerosis with severe stenoses of the proximal  basilar artery and left P2 segment and mild to moderate stenoses of  the distal left vertebral artery, left P2 segment, and left MCA  bifurcation.  05/08/14 Echocardiogram:  Study Conclusions  - Left ventricle: The cavity size was normal. Wall thickness was increased in a pattern of moderate LVH. Systolic function was normal. The estimated ejection fraction was in the range of 60% to 65%. - Aortic valve: There was mild to moderate stenosis. There was mild regurgitation - Mitral valve: There was mild regurgitation.  06/06/14 Duplex Venous US LLE: deep venous thrombosis at left common femoral and mid to proximal superficial femoral veins.    Assessment/Plan Urinary hesitancy 06/30/14 US bladder: no significant abnormality evident, but initially only partially distended urinary bladder limiting evaluation. Post void residual 29ml.  She stated she has seen Dr. Urology and wears Estrogen ring(to be changed q78m)-dc'd due to recent CVA and LLE DVT. Urinary hesitancy was resolved after treat with Nitrofurantoin 100mg  bid x 7 along with Florastor bid.  08/28/14 c/o worsened urinary frequency-UA C/S to r/o UTI   Constipation Better MiraLax 17gm +4 Oz water po bid.      Hyponatremia 05/20/14 Na 139 06/26/14 Na 139    CVA (cerebral infarction) c/o LUE and LLE numbness. Muscle strength 5/5  05/08/14 MRI brain:  IMPRESSION:  1. Acute, moderately large right PCA territory infarct. No  hemorrhage.  2. Fetal type origins of the PCAs with occlusion of the proximal P2  segment on the right.  3. Intracranial atherosclerosis with severe stenoses of the proximal    basilar artery and left P2 segment and mild to moderate stenoses of  the distal left vertebral artery, left P2 segment, and left MCA  bifurcation.  dc'd Plavix 06/06/14, started Eliquis for new onset DVT LLE in setting of recent CVA and continued Zocor for risk reduction.   Discontinue estrogen vaginal ring.   07/02/14 c/o "foggy head"-f/u Neurology      Chronic low back pain Not new. Change Hydrocodone/ApAp 5/325mg  bid for better pain control instead of prn.      Anxiety Takes Ativan 0.5mg  bid.     Acute DVT (deep venous thrombosis) Left leg swelling, aches, feels heavy 06/06/14. 06/06/14 Duplex Venous US LLE: deep venous thrombosis at left common femoral and mid to proximal superficial femoral veins. Continue Eliqui 5mg  bid  07/01/14 Urology removed Estrogen ring 08/27/14 less edematous LLE      Family/ Staff Communication: observe the patient  Goals of Care: [place]  Labs/tests ordered: UA C/S

## 2014-08-27 NOTE — Assessment & Plan Note (Signed)
c/o LUE and LLE numbness. Muscle strength 5/5  05/08/14 MRI brain:  IMPRESSION:  1. Acute, moderately large right PCA territory infarct. No  hemorrhage.  2. Fetal type origins of the PCAs with occlusion of the proximal P2  segment on the right.  3. Intracranial atherosclerosis with severe stenoses of the proximal  basilar artery and left P2 segment and mild to moderate stenoses of  the distal left vertebral artery, left P2 segment, and left MCA  bifurcation.  dc'd Plavix 06/06/14, started Eliquis for new onset DVT LLE in setting of recent CVA and continued Zocor for risk reduction.   Discontinue estrogen vaginal ring.   07/02/14 c/o "foggy head"-f/u Neurology

## 2014-08-27 NOTE — Assessment & Plan Note (Signed)
05/20/14 Na 139 06/26/14 Na 139  

## 2014-08-27 NOTE — Assessment & Plan Note (Signed)
Left leg swelling, aches, feels heavy 06/06/14. 06/06/14 Duplex Venous US LLE: deep venous thrombosis at left common femoral and mid to proximal superficial femoral veins. Continue Eliqui 5mg  bid  07/01/14 Urology removed Estrogen ring 08/27/14 less edematous LLE

## 2014-08-27 NOTE — Assessment & Plan Note (Addendum)
06/30/14 US bladder: no significant abnormality evident, but initially only partially distended urinary bladder limiting evaluation. Post void residual 29ml.  She stated she has seen Dr. Urology and wears Estrogen ring(to be changed q7569m)-dc'd due to recent CVA and LLE DVT. Urinary hesitancy was resolved after treat with Nitrofurantoin 100mg  bid x 7 along with Florastor bid.  08/28/14 c/o worsened urinary frequency-UA C/S to r/o UTI

## 2014-08-27 NOTE — Assessment & Plan Note (Signed)
Takes Ativan 0.5mg  bid.

## 2014-08-27 NOTE — Assessment & Plan Note (Signed)
Better MiraLax 17gm +4 Oz water po bid.

## 2014-08-27 NOTE — Assessment & Plan Note (Signed)
Not new. Change Hydrocodone/ApAp 5/325mg  bid for better pain control instead of prn.

## 2014-09-02 ENCOUNTER — Ambulatory Visit: Payer: Self-pay | Admitting: Neurology

## 2014-09-08 ENCOUNTER — Encounter: Payer: Self-pay | Admitting: Nurse Practitioner

## 2014-09-08 DIAGNOSIS — N390 Urinary tract infection, site not specified: Secondary | ICD-10-CM

## 2014-09-08 HISTORY — DX: Urinary tract infection, site not specified: N39.0

## 2014-09-16 ENCOUNTER — Encounter: Payer: Self-pay | Admitting: *Deleted

## 2014-09-16 ENCOUNTER — Encounter: Payer: Self-pay | Admitting: Neurology

## 2014-09-16 ENCOUNTER — Ambulatory Visit (INDEPENDENT_AMBULATORY_CARE_PROVIDER_SITE_OTHER): Payer: Medicare Other | Admitting: Neurology

## 2014-09-16 VITALS — BP 149/70 | HR 72 | Ht 64.0 in | Wt 141.2 lb

## 2014-09-16 DIAGNOSIS — I63531 Cerebral infarction due to unspecified occlusion or stenosis of right posterior cerebral artery: Secondary | ICD-10-CM

## 2014-09-16 DIAGNOSIS — Z96649 Presence of unspecified artificial hip joint: Secondary | ICD-10-CM

## 2014-09-16 DIAGNOSIS — T84029D Dislocation of unspecified internal joint prosthesis, subsequent encounter: Secondary | ICD-10-CM

## 2014-09-16 DIAGNOSIS — E785 Hyperlipidemia, unspecified: Secondary | ICD-10-CM

## 2014-09-16 DIAGNOSIS — I639 Cerebral infarction, unspecified: Secondary | ICD-10-CM

## 2014-09-16 DIAGNOSIS — I82402 Acute embolism and thrombosis of unspecified deep veins of left lower extremity: Secondary | ICD-10-CM

## 2014-09-16 NOTE — Patient Instructions (Addendum)
-   continue eliquis and lipitor for stroke prevention and DVT treatment - continue PT/OT at your facility - will do transcranial doppler with bubble study and emboli monitoring (I have ordered for you) - pt blood pressure still a little bit high in clinic. Recommend lisinopril 10mg  daily for better BP control. - Follow up with your primary care physician for stroke risk factor modification. Recommend maintain blood pressure goal <130/80, diabetes with hemoglobin A1c goal below 6.5% and lipids with LDL cholesterol goal below 70 mg/dL.  - continue zoloft for depression. - please tell you facility to order neurontin 100mg  three times a day for your left arm and leg numbness.  - RTC in 3 months.

## 2014-09-16 NOTE — Progress Notes (Signed)
STROKE NEUROLOGY FOLLOW UP NOTE  NAME: [name] DOB: [birth year]  REASON FOR VISIT: stroke follow up HISTORY FROM: pt and chart  Today we had the pleasure of seeing [name] in follow-up at our Neurology Clinic. Pt was accompanied by nurse from NH.   History Summary 80 yo white F with PMH of CVA, HLD, left hip dislocation was admitted 05/07/14 due to left hemiparesis and left hemiparesthesia. MRI showed moderately large right PCA territory infarct including right thalamus. Felt to be thrombotic, started on ASA 325mg  and discharged with ASA and lipitor. Her left hip dislocation was treated in ER and on discharge she still has 4/5 left leg weakness and left arm and leg numbness with left hemianopia. She was sent to SNF.   06/09/14 follow up - the patient has been doing fair. However, she was found to have left femoral vein DVT on 8/21 with venous doppler due to left leg swelling and pain. She was put on eliquis this morning. She still has significant swelling of left leg now and pt complains of heaviness and numbness of left leg. She still complain of left arm numbness, no weakness from her stroke one month ago. She still has visual field problem on the left also due to stroke last month. She currently can use walker to walk at [facility].  07/11/14 follow up - she was found to have depressed mood, refusing to have meals and refusing to come out of room. She was started on zoloft 06/27/14 and now on 25mg  daily. She also complained head pressure at frontal area, heaviness feeling, not HA, dizziness but feeling odd and sometimes causing nauseas but no vomiting. Today in clinic her BP 173/79, but one month ago in clinic it was 123/72. She still complains of comes and goes left arm > leg numbness, and sometimes fear of falling on walking but if she uses walker to walk, it will be OK.  Interval History During the interval time,  she was doing better. She was able to participate activities in the  facility. Her BP was better but still a little bit high, today in clinic 149/70. She is not on BP meds yet. She still complains of left UE and LE numbness, intermittent, consistent with her right PCA stroke.   REVIEW OF SYSTEMS: Full 14 system review of systems performed and notable only for those listed below and in HPI above, all others are negative:  Constitutional: N/A  Cardiovascular: leg swelling  Ear/Nose/Throat: N/A  Skin: itching  Eyes: N/A  Respiratory: N/A  Gastroitestinal: N/A  Genitourinary: N/A Hematology/Lymphatic: N/A  Endocrine: N/A  Musculoskeletal: back pain  Allergy/Immunology: N/A  Neurological: numbness, walking difficulty  Psychiatric: N/A  The following represents the patient's updated allergies and side effects list: Allergies  Allergen Reactions  . Augmentin [Amoxicillin-Pot Clavulanate] Nausea Only    Labs since last visit of relevance include the following: Results for orders placed or performed in visit on 08/06/14  TSH  Result Value Ref Range   TSH 2.34 0.41 - 5.90 uIU/mL    The neurologically relevant items on the patient's problem list were reviewed on today's visit.  Neurologic Examination  A problem focused neurological exam (12 or more points of the single system neurologic examination, vital signs counts as 1 point, cranial nerves count for 8 points) was performed.  Blood pressure 149/70, pulse 72, height 5\' 4"  (1.626 m), weight 141 lb 3.2 oz (64.048 kg).  General - Well nourished, well developed, in  no apparent distress.  Ophthalmologic - not able to see through.  Cardiovascular - Regular rate and rhythm with no murmur.  Extremities - Left leg mild swelling, much improved from last visit, no tenderness on touch  Mental Status -  Level of arousal and orientation to time, place, and person were intact. Language including expression, naming, repetition, comprehension was assessed and found intact.  Cranial Nerves II - XII - II -  left upper quadranopia and partial left lower quadrianopia, same as last visit. III, IV, VI - Extraocular movements intact. V - Facial sensation intact bilaterally. VII - Facial movement intact bilaterally. VIII - hard of hearing but vestibular intact bilaterally. X - Palate elevates symmetrically. XI - Chin turning & shoulder shrug intact bilaterally. XII - Tongue protrusion intact.  Motor Strength - The patient's strength was normal in all extremities and pronator drift was absent.  Bulk was normal and fasciculations were absent.   Motor Tone - Muscle tone was assessed at the neck and appendages and was normal.  Reflexes - The patient's reflexes were normal in all extremities and she had no pathological reflexes.  Sensory - Light touch, temperature/pinprick was symmetrical bilaterally.    Coordination - The patient had normal movements in the hands with no ataxia or dysmetria.  Tremor was absent.  Gait and Station - walk with walker, slow, stooped posturing, difficulty getting out from chair.  Data reviewed: I personally reviewed the images and agree with the radiology interpretations.  Ct Head Wo Contrast  05/07/2014  Probable acute to subacute right occipital infarct without evidence of hemorrhage. Wallace CullensGray matter involvement is not well demonstrated raising the question that this could be related to vasogenic edema. MRI could be used to further evaluate given the possibility that this is related to edema and not ischemia, intravenous contrast material is recommended if that study is performed.  Ct Cervical Spine Wo Contrast  05/07/2014  Degenerative changes in the cervical spine without evidence of an acute fracture.  Mr Laqueta JeanBrain W Wo Contrast  05/08/2014  Acute, moderately large right PCA territory infarct. No hemorrhage.  Mr Maxine GlennMra Head/brain Wo Cm  05/08/2014  Fetal type origins of the PCAs with occlusion of the proximal P2 segment on the right.  Intracranial atherosclerosis with severe  stenoses of the proximal basilar artery and left P2 segment and mild to moderate stenoses of the distal left vertebral artery, left P2 segment, and left MCA bifurcation.  Carotid Doppler Bilateral: Mild calcified plaque origin ICA. 1-39% ICA stenosis. Vertbral artery flow is antegrade.  2D Echocardiogram  The estimated ejection fraction was in the range of 60% to 65%. - Aortic valve: There was mild to moderate stenosis. There was mild regurgitation. Valve area (VTI): 0.8 cm^2. Valve area (Vmax): 0.76 cm^2. - Mitral valve: There was mild regurgitation. - Atrial septum: No defect or patent foramen ovale was identified.  Duplex Venous US LLE 06/06/14 : deep venous thrombosis at left common femoral and mid to proximal superficial femoral veins. Eliquis 10mg  bid x 7 days then 5mg  bid EKG - Normal sinus rhythm rate 79 beats per minute. Possible Inferior infarct , age undetermined Cannot rule out Anterior infarct , age undetermined. T wave abnormality, consider lateral ischemia   Component     Latest Ref Rng 05/08/2014 05/20/2014  Cholesterol     0 - 200 mg/dL 161198   Triglycerides     <150 mg/dL 86   HDL     >09>39 mg/dL 55   Total CHOL/HDL Ratio  3.6   VLDL     0 - 40 mg/dL 17   LDL (calc)     0 - 99 mg/dL 272126 (H)   Hemoglobin Z3GA1C     <5.7 % 5.8 (H)   Mean Plasma Glucose     <117 mg/dL 644120 (H)   TSH     0.340.41 - 5.90 uIU/mL 1.590 0.24 (A)    Assessment: As you may recall, she is a 80 y.o. Caucasian female with PMH of CVA, HLD, left hip dislocation on 05/06/14 admitted on 05/07/2014 for right PCA stroke due to left sided weaknss, numbness and hemianopia. Typical for PCA stroke. On ASA initially but found to have DVT on 06/06/14 venous doppler. She was then put on eliquis for DVT. Still has intermittent numbness of left arm and leg and I recommended neurontin 100mg  tid. Her BP still a little high so I recommended low dose lisinopril. For her etiology of stroke, although atherothrombotic etiology is  possibility, she has PCA stroke after left hip dislocation and then found to have symptomatic left LE DVT one month later, paradoxical emboli can not be excluded. Will do TCD bubble study to evaluate PFO.   Plan:  - continue eliquis for DVT treatment and stroke prevention - continue lipitor for stroke prevention. - TCD bubble study and MES.  - recommend lisinopril 10mg  for better BP control - recommend neurontin 100mg  tid for left hemiparethesia - continue zoloft for depression. - Follow up with your primary care physician for stroke risk factor modification. Recommend maintain blood pressure goal <130/80, diabetes with hemoglobin A1c goal below 6.5% and lipids with LDL cholesterol goal below 70 mg/dL. - continue PT/OT at facility. - RTC in 3 months.  Orders Placed This Encounter  Procedures  . US TCD WITH BUBBLES    Standing Status: Future     Number of Occurrences:      Standing Expiration Date: 03/19/2015    Order Specific Question:  Reason for Exam (SYMPTOM  OR DIAGNOSIS REQUIRED)    Answer:  stroke with DVT    Order Specific Question:  Preferred imaging location?    Answer:  Internal  . US TCD WITHMONITORING    Standing Status: Future     Number of Occurrences:      Standing Expiration Date: 03/19/2015    Order Specific Question:  Reason for Exam (SYMPTOM  OR DIAGNOSIS REQUIRED)    Answer:  stroke    Order Specific Question:  Preferred imaging location?    Answer:  Internal    Patient Instructions  - continue eliquis and lipitor for stroke prevention and DVT treatment - continue PT/OT at your facility - will do transcranial doppler with bubble study and emboli monitoring (I have ordered for you) - pt blood pressure still a little bit high in clinic. Recommend lisinopril 10mg  daily for better BP control. - Follow up with your primary care physician for stroke risk factor modification. Recommend maintain blood pressure goal <130/80, diabetes with hemoglobin A1c goal below 6.5%  and lipids with LDL cholesterol goal below 70 mg/dL.  - continue zoloft for depression. - please tell you facility to order neurontin 100mg  three times a day for your left arm and leg numbness.  - RTC in 3 months.    Marvel PlanJindong Chaska Hagger, MD PhD [facility] [address], [address] [place], [place] [address] [phone]

## 2014-09-17 ENCOUNTER — Encounter: Payer: Self-pay | Admitting: Nurse Practitioner

## 2014-09-17 DIAGNOSIS — I10 Essential (primary) hypertension: Secondary | ICD-10-CM | POA: Insufficient documentation

## 2014-09-17 HISTORY — DX: Essential (primary) hypertension: I10

## 2014-09-24 ENCOUNTER — Non-Acute Institutional Stay (SKILLED_NURSING_FACILITY): Payer: Medicare Other | Admitting: Nurse Practitioner

## 2014-09-24 ENCOUNTER — Encounter: Payer: Self-pay | Admitting: Nurse Practitioner

## 2014-09-24 DIAGNOSIS — I82402 Acute embolism and thrombosis of unspecified deep veins of left lower extremity: Secondary | ICD-10-CM

## 2014-09-24 DIAGNOSIS — K5900 Constipation, unspecified: Secondary | ICD-10-CM

## 2014-09-24 DIAGNOSIS — I10 Essential (primary) hypertension: Secondary | ICD-10-CM

## 2014-09-24 DIAGNOSIS — R3911 Hesitancy of micturition: Secondary | ICD-10-CM

## 2014-09-24 DIAGNOSIS — J310 Chronic rhinitis: Secondary | ICD-10-CM

## 2014-09-24 DIAGNOSIS — M545 Low back pain, unspecified: Secondary | ICD-10-CM

## 2014-09-24 DIAGNOSIS — G8929 Other chronic pain: Secondary | ICD-10-CM

## 2014-09-24 DIAGNOSIS — F418 Other specified anxiety disorders: Secondary | ICD-10-CM

## 2014-09-24 DIAGNOSIS — I639 Cerebral infarction, unspecified: Secondary | ICD-10-CM

## 2014-09-24 DIAGNOSIS — E039 Hypothyroidism, unspecified: Secondary | ICD-10-CM

## 2014-09-24 NOTE — Assessment & Plan Note (Signed)
Not new. Change Hydrocodone/ApAp 5/325mg  bid and q6h prn.

## 2014-09-24 NOTE — Assessment & Plan Note (Signed)
Left leg swelling, aches, feels heavy 06/06/14. 06/06/14 Duplex Venous US LLE: deep venous thrombosis at left common femoral and mid to proximal superficial femoral veins. Continue Eliqui 5mg  bid  07/01/14 Urology removed Estrogen ring 08/27/14 less edematous LLE 09/24/14 persisted edema LLE>RLL since onset of the DVT.

## 2014-09-24 NOTE — Assessment & Plan Note (Signed)
06/30/14 US bladder: no significant abnormality evident, but initially only partially distended urinary bladder limiting evaluation. Post void residual 29ml.  She stated she has seen Dr. Urology and wears Estrogen ring(to be changed q7443m)-dc'd due to recent CVA and LLE DVT. Urinary hesitancy was resolved after treat with Nitrofurantoin 100mg  bid x 7 along with Florastor bid.  08/28/14 c/o worsened urinary frequency-identified UTI-fully treated 09/24/14 c/o didn't think her UTI went away-getting up a lot at night-UA C/S and CBC in am

## 2014-09-24 NOTE — Assessment & Plan Note (Signed)
The patient admitted she is depressed and desires antidepressant-started Sertraline 25mg  daily 06/26/14. No longer c/o "foggy head" will eliminate possible AR of medications: hold Zytec x 1wk-will change Zyrtec to prn.  09/24/14 stable, takes Ativan 0.5mg  bid, Sertraline 25mg  qhs.

## 2014-09-24 NOTE — Assessment & Plan Note (Signed)
Allergy profile 05/24/2011-negative, total IgE 4.7 09/24/14 stable, takes Singulair 10mg  daily and prn Cetirizine 10mg  daily prn.

## 2014-09-24 NOTE — Assessment & Plan Note (Signed)
05/20/14 TSH 0.245 07/01/14 TSH 2.366 08/06/14 continue Levothyroxine 75mcg  

## 2014-09-24 NOTE — Assessment & Plan Note (Addendum)
09/16/14 Neurology: Lisinopril 10mg  daily  09/24/14 controlled. Also takes Atorvastatin for cardiovascular risk reduction. Update CMP

## 2014-09-24 NOTE — Progress Notes (Signed)
Patient ID: [name], female   DOB: [birth year], 80 y.o.   MRN: [id number]   Code Status: DNR  Allergies  Allergen Reactions  . Augmentin [Amoxicillin-Pot Clavulanate] Nausea Only    Chief Complaint  Patient presents with  . Medical Management of Chronic Issues  . Acute Visit    urinary frquency    HPI: Patient is a 80 y.o. female seen in the SNF at [facility] today for evaluation of urinary frequency and chronic medical conditions.   Hospitalized from 05/07/2014-05/10/2014  For CVA (cerebral infarction) with mild left sided weakness and Dislocation of the left prosthesis-spontaneous dislocation of her left hip was reduced by orthopedics and anesthesia.  Head CT obtained shows probable acute to subacute right occipital infarct without evidence of hemorrhage however no significant grave matter of all meds with a question of vasogenic edema. Degenerative changes were noted on the cervical spine. Hip x-ray within normal limits.   Problem List Items Addressed This Visit    Urinary hesitancy - Primary    06/30/14 US bladder: no significant abnormality evident, but initially only partially distended urinary bladder limiting evaluation. Post void residual 29ml.  She stated she has seen Dr. Urology and wears Estrogen ring(to be changed q48m)-dc'd due to recent CVA and LLE DVT. Urinary hesitancy was resolved after treat with Nitrofurantoin 100mg  bid x 7 along with Florastor bid.  08/28/14 c/o worsened urinary frequency-identified UTI-fully treated 09/24/14 c/o didn't think her UTI went away-getting up a lot at night-UA C/S and CBC in am      Stroke    C/o LUE and LLE numbness. Muscle strength 5/5  05/08/14 MRI brain:  IMPRESSION:  1. Acute, moderately large right PCA territory infarct. No  hemorrhage.  2. Fetal type origins of the PCAs with occlusion of the proximal P2  segment on the right.  3. Intracranial atherosclerosis with severe stenoses of the proximal  basilar  artery and left P2 segment and mild to moderate stenoses of  the distal left vertebral artery, left P2 segment, and left MCA  bifurcation.  Follow up with the stroke MD in 2 weeks, Dr. Roda Shutters.  Continue Atorvastatin for risk reduction.   09/24/14 continue Gabapentin for c/o numbness in L limbs per Neurology.      Nonallergic rhinitis    Allergy profile 05/24/2011-negative, total IgE 4.7 09/24/14 stable, takes Singulair 10mg  daily and prn Cetirizine 10mg  daily prn.       Mixed anxiety depressive disorder    The patient admitted she is depressed and desires antidepressant-started Sertraline 25mg  daily 06/26/14. No longer c/o "foggy head" will eliminate possible AR of medications: hold Zytec x 1wk-will change Zyrtec to prn.  09/24/14 stable, takes Ativan 0.5mg  bid, Sertraline 25mg  qhs.       Hypothyroidism    05/20/14 TSH 0.245 07/01/14 TSH 2.366 08/06/14 continue Levothyroxine      HTN (hypertension)    09/16/14 Neurology: Lisinopril 10mg  daily  09/24/14 controlled. Also takes Atorvastatin for cardiovascular risk reduction. Update CMP    Constipation    Controlled, takes MiraLax 17gm +4 Oz water po bid.       Chronic low back pain    Not new. Change Hydrocodone/ApAp 5/325mg  bid and q6h prn.         Acute DVT (deep venous thrombosis)    Left leg swelling, aches, feels heavy 06/06/14. 06/06/14 Duplex Venous US LLE: deep venous thrombosis at left common femoral and mid to proximal superficial femoral veins. Continue Eliqui 5mg  bid  07/01/14 Urology removed Estrogen ring 08/27/14 less edematous LLE 09/24/14 persisted edema LLE>RLL since onset of the DVT.          Review of Systems: Review of Systems  Constitutional: Negative for fever, chills, weight loss, malaise/fatigue and diaphoresis.  HENT: Positive for hearing loss. Negative for congestion, ear discharge, ear pain, nosebleeds, sore throat and tinnitus.   Eyes: Positive for blurred vision. Negative for double vision,  photophobia, pain, discharge and redness.       Left eye  Respiratory: Negative for cough, hemoptysis, sputum production, shortness of breath, wheezing and stridor.   Cardiovascular: Positive for leg swelling. Negative for chest pain, palpitations, orthopnea, claudication and PND.       LLE swelling and aches.   Gastrointestinal: Negative for heartburn, nausea, vomiting, abdominal pain, diarrhea, constipation, blood in stool and melena.  Genitourinary: Positive for frequency. Negative for dysuria, urgency, hematuria and flank pain.       New hesitancy  Musculoskeletal: Positive for back pain. Negative for myalgias, joint pain, falls and neck pain.       Chronic  Skin: Negative for itching and rash.  Neurological: Positive for sensory change and focal weakness. Negative for dizziness, tingling, tremors, speech change, seizures, loss of consciousness, weakness and headaches.       Numbness and paraesthesia LUE and LLE. Left sided weakness even if grip strength 5/5  Endo/Heme/Allergies: Negative for environmental allergies and polydipsia. Does not bruise/bleed easily.  Psychiatric/Behavioral: Positive for depression. Negative for suicidal ideas, hallucinations, memory loss and substance abuse. The patient is nervous/anxious. The patient does not have insomnia.        Foggy head-better since Zoloft started 06/26/14 and Zyrtec hold 06/26/14     Past Medical History  Diagnosis Date  . Deviated nasal septum   . Dysfunction of eustachian tube   . Unspecified sinusitis (chronic)   . Allergic rhinitis, cause unspecified   . Arthritis   . Cataract   . Senile osteoporosis   . Hx of syncope     had loop recorder for 2 years now explanted, only PACs noted on device for over 2 years, no a fib, Echo 08/18/09 EF >55%mild concentric LVHnormal myoview, normal carotid dopplers 2010  . Pruritus of skin     poss. related to Losartan  . Hyperlipidemia   . Normal cardiac stress test     lexiscan myoview  04/08/08  . CVA (cerebral infarction) 2015  . Cerebral atrophy 2015  . Cerebrovascular disease 2015  . Dislocation of hip prosthesis 2015  . Lumbago 2015  . Unspecified hypothyroidism 2015  . Anxiety 2015  . Hyponatremia 2015  . Cervical spine degeneration 2015   Past Surgical History  Procedure Laterality Date  . Tonsillectomy    . Total hip arthroplasty      bilateral  . Pelvic sling    . Joint replacement    . Loop recorder implant  07/2009    to eval syncope  . Loop recorder explant  01/12/2012   Social History:   reports that she has never smoked. She has never used smokeless tobacco. She reports that she does not drink alcohol or use illicit drugs.  Family History  Problem Relation Age of Onset  . Cancer Father     Medications: Patient's Medications  New Prescriptions   No medications on file  Previous Medications   APIXABAN (ELIQUIS) 5 MG TABS TABLET    Take 5 mg by mouth 2 (two) times daily.   ATORVASTATIN (LIPITOR)  10 MG TABLET    Take 10 mg by mouth daily.   CETIRIZINE (ZYRTEC) 10 MG TABLET    Take 10 mg by mouth daily.   HYDROCODONE-ACETAMINOPHEN (NORCO/VICODIN) 5-325 MG PER TABLET    Take one tablet by mouth twice daily for pain   LEVOTHYROXINE (SYNTHROID, LEVOTHROID) 50 MCG TABLET    Take 50 mcg by mouth daily before breakfast.   LORAZEPAM (ATIVAN) 0.5 MG TABLET    Take one tablet by mouth twice daily at 8am and 10pm for anxiety   MECLIZINE (ANTIVERT) 25 MG TABLET    Take 25 mg by mouth 3 (three) times daily as needed for dizziness.   MONTELUKAST (SINGULAIR) 10 MG TABLET    Take 10 mg by mouth at bedtime.   POLYETHYL GLYCOL-PROPYL GLYCOL (SYSTANE) 0.4-0.3 % SOLN    Place 1 drop into both eyes 2 (two) times daily.   POLYETHYLENE GLYCOL (MIRALAX / GLYCOLAX) PACKET    Take 17 g by mouth 2 (two) times daily.   SERTRALINE (ZOLOFT) 25 MG TABLET      Modified Medications   No medications on file  Discontinued Medications   No medications on file     Physical  Exam:  Physical Exam  Constitutional: She is oriented to person, place, and time. She appears well-developed and well-nourished. No distress.  HENT:  Head: Normocephalic and atraumatic.  Right Ear: External ear normal.  Left Ear: External ear normal.  Nose: Nose normal.  Mouth/Throat: Oropharynx is clear and moist. No oropharyngeal exudate.  Eyes: Conjunctivae and EOM are normal. Pupils are equal, round, and reactive to light. Right eye exhibits no discharge. Left eye exhibits no discharge. No scleral icterus.  Neck: Normal range of motion. Neck supple. No JVD present. No tracheal deviation present. No thyromegaly present.  Cardiovascular: Normal rate and regular rhythm.  Exam reveals no gallop and no friction rub.   Murmur heard. 4/6  Pulmonary/Chest: Effort normal and breath sounds normal. No stridor. No respiratory distress. She has no wheezes. She has no rales. She exhibits no tenderness.  Abdominal: Soft. Bowel sounds are normal. She exhibits no distension and no mass. There is no tenderness. There is no rebound and no guarding.  Musculoskeletal: Normal range of motion. She exhibits edema. She exhibits no tenderness.  Chronic back pain. LLE swelling 1+ and aches   Lymphadenopathy:    She has no cervical adenopathy.  Neurological: She is alert and oriented to person, place, and time. She has normal reflexes. No cranial nerve deficit. She exhibits normal muscle tone. Coordination abnormal.  Slight weakness LUE and LLE  Skin: Skin is warm and dry. No rash noted. She is not diaphoretic. No erythema. No pallor.  Psychiatric: She has a normal mood and affect. Her behavior is normal. Judgment and thought content normal.    Filed Vitals:   09/24/14 1549  BP: 146/80  Pulse: 52  Temp: 98.2 F (36.8 C)  TempSrc: Tympanic  Resp: 18      Labs reviewed: Basic Metabolic Panel:  Recent Labs  16/07/9606/22/15 1720 05/07/14 2046 05/08/14 1440 05/09/14 0443 05/12/14 05/20/14 06/26/14 07/01/14   NA 136* 134*  --  138 132* 139 130*  --   K 4.4 4.2  --  4.0 4.0 4.2 3.9  --   CL 97 102  --  102  --   --   --   --   CO2 23  --   --  23  --   --   --   --  GLUCOSE 98 107*  --  100*  --   --   --   --   BUN 13 14  --  12 14 12 10   --   CREATININE 0.65 0.60  --  0.68 0.7 0.7 0.6  --   CALCIUM 9.2  --   --  8.6  --   --   --   --   TSH  --   --  1.590  --   --  0.24*  --  2.34   Liver Function Tests: No results for input(s): AST, ALT, ALKPHOS, BILITOT, PROT, ALBUMIN in the last 8760 hours. No results for input(s): LIPASE, AMYLASE in the last 8760 hours. No results for input(s): AMMONIA in the last 8760 hours. CBC:  Recent Labs  05/06/14 0025  05/07/14 1720  05/09/14 0443 05/12/14 06/26/14  WBC 5.5  --  5.4  --  5.1 5.1 4.3  HGB 13.3  < > 14.4  < > 12.4 13.1 11.5*  HCT 39.0  < > 43.4  < > 37.6 38 35*  MCV 87.1  --  90.2  --  88.5  --   --   PLT 224  --  256  --  220 260 268  < > = values in this interval not displayed. Lipid Panel:  Recent Labs  05/08/14 0540  CHOL 198  HDL 55  LDLCALC 126*  TRIG 86  CHOLHDL 3.6    Past Procedures:  05/06/14 X-ray Pelvis:  IMPRESSION:  Successful reduction of previously noted left hip prosthesis  dislocation. No evidence of fracture or loosening.    05/07/14 CT head and cervical spine w/o contrast:  IMPRESSION:  Probable acute to subacute right occipital infarct without evidence  of hemorrhage. Wallace Cullens matter involvement is not well demonstrated  raising the question that this could be related to vasogenic edema.  Degenerative changes in the cervical spine without evidence of an  acute fracture.   05/08/14 MRI brain:  IMPRESSION:  1. Acute, moderately large right PCA territory infarct. No  hemorrhage.  2. Fetal type origins of the PCAs with occlusion of the proximal P2  segment on the right.  3. Intracranial atherosclerosis with severe stenoses of the proximal  basilar artery and left P2 segment and mild to moderate  stenoses of  the distal left vertebral artery, left P2 segment, and left MCA  bifurcation.  05/08/14 Echocardiogram:  Study Conclusions  - Left ventricle: The cavity size was normal. Wall thickness was increased in a pattern of moderate LVH. Systolic function was normal. The estimated ejection fraction was in the range of 60% to 65%. - Aortic valve: There was mild to moderate stenosis. There was mild regurgitation - Mitral valve: There was mild regurgitation.  06/06/14 Duplex Venous US LLE: deep venous thrombosis at left common femoral and mid to proximal superficial femoral veins.    Assessment/Plan Urinary hesitancy 06/30/14 US bladder: no significant abnormality evident, but initially only partially distended urinary bladder limiting evaluation. Post void residual 29ml.  She stated she has seen Dr. Urology and wears Estrogen ring(to be changed q67m)-dc'd due to recent CVA and LLE DVT. Urinary hesitancy was resolved after treat with Nitrofurantoin 100mg  bid x 7 along with Florastor bid.  08/28/14 c/o worsened urinary frequency-identified UTI-fully treated 09/24/14 c/o didn't think her UTI went away-getting up a lot at night-UA C/S and CBC in am    Mixed anxiety depressive disorder The patient admitted she is depressed and desires antidepressant-started Sertraline 25mg  daily  06/26/14. No longer c/o "foggy head" will eliminate possible AR of medications: hold Zytec x 1wk-will change Zyrtec to prn.  09/24/14 stable, takes Ativan 0.5mg  bid, Sertraline 25mg  qhs.     Hypothyroidism 05/20/14 TSH 0.245 07/01/14 TSH 2.366 08/06/14 continue Levothyroxine 75mcg    Acute DVT (deep venous thrombosis) Left leg swelling, aches, feels heavy 06/06/14. 06/06/14 Duplex Venous US LLE: deep venous thrombosis at left common femoral and mid to proximal superficial femoral veins. Continue Eliqui 5mg  bid  07/01/14 Urology removed Estrogen ring 08/27/14 less edematous LLE 09/24/14 persisted edema LLE>RLL  since onset of the DVT.     Chronic low back pain Not new. Change Hydrocodone/ApAp 5/325mg  bid and q6h prn.       Constipation Controlled, takes MiraLax 17gm +4 Oz water po bid.     Nonallergic rhinitis Allergy profile 05/24/2011-negative, total IgE 4.7 09/24/14 stable, takes Singulair 10mg  daily and prn Cetirizine 10mg  daily prn.     HTN (hypertension) 09/16/14 Neurology: Lisinopril 10mg  daily  09/24/14 controlled. Also takes Atorvastatin for cardiovascular risk reduction. Update CMP  Stroke C/o LUE and LLE numbness. Muscle strength 5/5  05/08/14 MRI brain:  IMPRESSION:  1. Acute, moderately large right PCA territory infarct. No  hemorrhage.  2. Fetal type origins of the PCAs with occlusion of the proximal P2  segment on the right.  3. Intracranial atherosclerosis with severe stenoses of the proximal  basilar artery and left P2 segment and mild to moderate stenoses of  the distal left vertebral artery, left P2 segment, and left MCA  bifurcation.  Follow up with the stroke MD in 2 weeks, Dr. Roda ShuttersXu.  Continue Atorvastatin for risk reduction.   09/24/14 continue Gabapentin for c/o numbness in L limbs per Neurology.      Family/ Staff Communication: observe the patient  Goals of Care: IL  Labs/tests ordered: UA C/S, CBC, CMP

## 2014-09-24 NOTE — Assessment & Plan Note (Signed)
Controlled, takes MiraLax 17gm +4 Oz water po bid.

## 2014-09-24 NOTE — Assessment & Plan Note (Signed)
C/o LUE and LLE numbness. Muscle strength 5/5  05/08/14 MRI brain:  IMPRESSION:  1. Acute, moderately large right PCA territory infarct. No  hemorrhage.  2. Fetal type origins of the PCAs with occlusion of the proximal P2  segment on the right.  3. Intracranial atherosclerosis with severe stenoses of the proximal  basilar artery and left P2 segment and mild to moderate stenoses of  the distal left vertebral artery, left P2 segment, and left MCA  bifurcation.  Follow up with the stroke MD in 2 weeks, Dr. Roda ShuttersXu.  Continue Atorvastatin for risk reduction.   09/24/14 continue Gabapentin for c/o numbness in L limbs per Neurology.

## 2014-09-25 ENCOUNTER — Encounter (HOSPITAL_COMMUNITY): Payer: Self-pay | Admitting: Cardiovascular Disease

## 2014-09-25 ENCOUNTER — Telehealth: Payer: Self-pay | Admitting: Radiology

## 2014-09-25 LAB — HEPATIC FUNCTION PANEL
ALT: 21 U/L (ref 7–35)
AST: 27 U/L (ref 13–35)
Alkaline Phosphatase: 126 U/L — AB (ref 25–125)
Bilirubin, Total: 0.4 mg/dL

## 2014-09-25 LAB — CBC AND DIFFERENTIAL
HCT: 35 % — AB (ref 36–46)
Hemoglobin: 11.2 g/dL — AB (ref 12.0–16.0)
Platelets: 163 10*3/uL (ref 150–399)
WBC: 4 10^3/mL

## 2014-09-25 LAB — BASIC METABOLIC PANEL
BUN: 13 mg/dL (ref 4–21)
Creatinine: 0.8 mg/dL (ref 0.5–1.1)
Glucose: 95 mg/dL
POTASSIUM: 4.4 mmol/L (ref 3.4–5.3)
SODIUM: 140 mmol/L (ref 137–147)

## 2014-09-25 NOTE — Telephone Encounter (Signed)
09/25/14 Attempts to call patient for scheduling bubble study were unsuccessful.  Letter mailed asking patient to call for her appointment date and time.

## 2014-09-29 ENCOUNTER — Other Ambulatory Visit: Payer: Self-pay | Admitting: Nurse Practitioner

## 2014-09-29 DIAGNOSIS — R3911 Hesitancy of micturition: Secondary | ICD-10-CM

## 2014-09-29 DIAGNOSIS — E871 Hypo-osmolality and hyponatremia: Secondary | ICD-10-CM

## 2014-10-01 ENCOUNTER — Telehealth: Payer: Self-pay | Admitting: Radiology

## 2014-10-01 NOTE — Telephone Encounter (Signed)
Left V/ mess. At [phone] that Bubble study scheduled for 11/03/13 @ 12:30  Arrive @ 12:15.

## 2014-10-22 ENCOUNTER — Non-Acute Institutional Stay (SKILLED_NURSING_FACILITY): Payer: Medicare Other | Admitting: Nurse Practitioner

## 2014-10-22 ENCOUNTER — Encounter: Payer: Self-pay | Admitting: Nurse Practitioner

## 2014-10-22 DIAGNOSIS — J310 Chronic rhinitis: Secondary | ICD-10-CM

## 2014-10-22 DIAGNOSIS — F418 Other specified anxiety disorders: Secondary | ICD-10-CM

## 2014-10-22 DIAGNOSIS — K5900 Constipation, unspecified: Secondary | ICD-10-CM

## 2014-10-22 DIAGNOSIS — I639 Cerebral infarction, unspecified: Secondary | ICD-10-CM

## 2014-10-22 DIAGNOSIS — I82402 Acute embolism and thrombosis of unspecified deep veins of left lower extremity: Secondary | ICD-10-CM

## 2014-10-22 DIAGNOSIS — E871 Hypo-osmolality and hyponatremia: Secondary | ICD-10-CM

## 2014-10-22 DIAGNOSIS — R3911 Hesitancy of micturition: Secondary | ICD-10-CM

## 2014-10-22 DIAGNOSIS — E039 Hypothyroidism, unspecified: Secondary | ICD-10-CM

## 2014-10-22 DIAGNOSIS — G8929 Other chronic pain: Secondary | ICD-10-CM

## 2014-10-22 DIAGNOSIS — I10 Essential (primary) hypertension: Secondary | ICD-10-CM

## 2014-10-22 DIAGNOSIS — M545 Low back pain: Secondary | ICD-10-CM

## 2014-10-22 DIAGNOSIS — J069 Acute upper respiratory infection, unspecified: Secondary | ICD-10-CM

## 2014-10-22 NOTE — Progress Notes (Signed)
Patient ID: [name], female   DOB: 1922/09/15, 81 y.o.   MRN: [id number]   Code Status: DNR  Allergies  Allergen Reactions  . Augmentin [Amoxicillin-Pot Clavulanate] Nausea Only    Chief Complaint  Patient presents with  . Medical Management of Chronic Issues  . Acute Visit    cough, scratchy throat    HPI: Patient is a 81 y.o. female seen in the SNF at [facility] today for evaluation of scratchy throat, cough,  and chronic medical conditions.   Hospitalized from 05/07/2014-05/10/2014  For CVA (cerebral infarction) with mild left sided weakness and Dislocation of the left prosthesis-spontaneous dislocation of her left hip was reduced by orthopedics and anesthesia.  Head CT obtained shows probable acute to subacute right occipital infarct without evidence of hemorrhage however no significant grave matter of all meds with a question of vasogenic edema. Degenerative changes were noted on the cervical spine. Hip x-ray within normal limits.   Problem List Items Addressed This Visit    Urinary hesitancy    06/30/14 US bladder: no significant abnormality evident, but initially only partially distended urinary bladder limiting evaluation. Post void residual 29ml.  She stated she has seen Dr. Urology and wears Estrogen ring(to be changed q67m)-dc'd due to recent CVA and LLE DVT. Urinary hesitancy was resolved after treat with Nitrofurantoin 100mg  bid x 7 along with Florastor bid.  08/28/14 c/o worsened urinary frequency-identified UTI-fully treated 09/24/14 c/o didn't think her UTI went away-getting up a lot at night-UA C/S and CBC in am 09/26/14 urine culture no growth 09/30/14 Urology: Leatrice Jewels vaginal Gel 3x/wk. Myrbetriq 25mg  daily.          Nonallergic rhinitis    Chronic. Allergy profile 05/24/2011-negative, total IgE 4.7      Mixed anxiety depressive disorder    The patient admitted she is depressed and desires antidepressant-started Sertraline 25mg  daily 06/26/14.  No longer c/o "foggy head" will eliminate possible AR of medications: hold Zytec x 1wk-will change Zyrtec to prn.  09/24/14 stable, takes Ativan 0.5mg  bid, Sertraline 25mg  qhs.      Hypothyroidism    05/20/14 TSH 0.245 07/01/14 TSH 2.366 08/06/14 continue Levothyroxine      Hyponatremia    05/20/14 Na 139 06/26/14 Na 139 09/25/14 Na 140     HTN (hypertension)    09/16/14 Neurology: Lisinopril 10mg  daily  09/24/14 controlled. Also takes Atorvastatin for cardiovascular risk reduction.      CVA (cerebral infarction)    C/o LUE and LLE numbness. Muscle strength 5/5  05/08/14 MRI brain:  IMPRESSION:  1. Acute, moderately large right PCA territory infarct. No  hemorrhage.  2. Fetal type origins of the PCAs with occlusion of the proximal P2  segment on the right.  3. Intracranial atherosclerosis with severe stenoses of the proximal  basilar artery and left P2 segment and mild to moderate stenoses of  the distal left vertebral artery, left P2 segment, and left MCA  bifurcation.  Follow up with the stroke MD in 2 weeks, Dr. Roda Shutters.  Continue Atorvastatin for risk reduction.   09/24/14 continue Gabapentin for c/o numbness in L limbs per Neurology.       Constipation    Controlled, takes MiraLax 17gm +4 Oz water po bid.        Chronic low back pain    Not new. Pain is managed with Hydrocodone/ApAp 5/325mg  bid and q6h prn.        Acute upper respiratory infection - Primary  Scratchy throat and hacking cough in setting of chronic rhinitis-will treat with 10 day course of Avelox and 5 day course of Mucinex-observe the patient.     Acute DVT (deep venous thrombosis)    Left leg swelling, aches, feels heavy 06/06/14. 06/06/14 Duplex Venous US LLE: deep venous thrombosis at left common femoral and mid to proximal superficial femoral veins. Continue Eliqui  bid  07/01/14 Urology removed Estrogen ring 08/27/14 less edematous LLE 09/24/14 persisted edema LLE>RLL since onset of the  DVT.  10/22/14 improved edema LLE          Review of Systems: Review of Systems  Constitutional: Negative for fever, chills, weight loss, malaise/fatigue and diaphoresis.  HENT: Positive for hearing loss. Negative for congestion, ear discharge, ear pain, nosebleeds, sore throat and tinnitus.        Scratchy throat  Eyes: Positive for blurred vision. Negative for double vision, photophobia, pain, discharge and redness.       Left eye  Respiratory: Positive for cough. Negative for hemoptysis, sputum production, shortness of breath, wheezing and stridor.   Cardiovascular: Positive for leg swelling. Negative for chest pain, palpitations, orthopnea, claudication and PND.       LLE swelling and aches.   Gastrointestinal: Negative for heartburn, nausea, vomiting, abdominal pain, diarrhea, constipation, blood in stool and melena.  Genitourinary: Positive for frequency. Negative for dysuria, urgency, hematuria and flank pain.       New hesitancy  Musculoskeletal: Positive for back pain. Negative for myalgias, joint pain, falls and neck pain.       Chronic  Skin: Negative for itching and rash.  Neurological: Positive for sensory change and focal weakness. Negative for dizziness, tingling, tremors, speech change, seizures, loss of consciousness, weakness and headaches.       Numbness and paraesthesia LUE and LLE. Left sided weakness even if grip strength 5/5  Endo/Heme/Allergies: Negative for environmental allergies and polydipsia. Does not bruise/bleed easily.  Psychiatric/Behavioral: Positive for depression. Negative for suicidal ideas, hallucinations, memory loss and substance abuse. The patient is nervous/anxious. The patient does not have insomnia.        Foggy head-better since Zoloft started 06/26/14 and Zyrtec hold 06/26/14     Past Medical History  Diagnosis Date  . Deviated nasal septum   . Dysfunction of eustachian tube   . Unspecified sinusitis (chronic)   . Allergic rhinitis, cause  unspecified   . Arthritis   . Cataract   . Senile osteoporosis   . Hx of syncope     had loop recorder for 2 years now explanted, only PACs noted on device for over 2 years, no a fib, Echo 08/18/09 EF >55%mild concentric LVHnormal myoview, normal carotid dopplers 2010  . Pruritus of skin     poss. related to Losartan  . Hyperlipidemia   . Normal cardiac stress test     lexiscan myoview 04/08/08  . CVA (cerebral infarction) 2015  . Cerebral atrophy 2015  . Cerebrovascular disease 2015  . Dislocation of hip prosthesis 2015  . Lumbago 2015  . Unspecified hypothyroidism 2015  . Anxiety 2015  . Hyponatremia 2015  . Cervical spine degeneration 2015   Past Surgical History  Procedure Laterality Date  . Tonsillectomy    . Total hip arthroplasty      bilateral  . Pelvic sling    . Joint replacement    . Loop recorder implant  07/2009    to eval syncope  . Loop recorder explant  01/12/2012  . Loop  recorder explant N/A 01/12/2012    Procedure: LOOP RECORDER EXPLANT;  Surgeon: Thurmon Fair, MD;  Location: [facility] CATH [facility];  Service: Cardiovascular;  Laterality: N/A;   Social History:   reports that she has never smoked. She has never used smokeless tobacco. She reports that she does not drink alcohol or use illicit drugs.  Family History  Problem Relation Age of Onset  . Cancer Father     Medications: Patient's Medications  New Prescriptions   No medications on file  Previous Medications   APIXABAN (ELIQUIS) 5 MG TABS TABLET    Take 5 mg by mouth 2 (two) times daily.   ATORVASTATIN (LIPITOR) 10 MG TABLET    Take 10 mg by mouth daily.   CETIRIZINE (ZYRTEC) 10 MG TABLET    Take 10 mg by mouth daily.   HYDROCODONE-ACETAMINOPHEN (NORCO/VICODIN) 5-325 MG PER TABLET    Take one tablet by mouth twice daily for pain   LEVOTHYROXINE (SYNTHROID, LEVOTHROID) 50 MCG TABLET    Take 50 mcg by mouth daily before breakfast.   LORAZEPAM (ATIVAN) 0.5 MG TABLET    Take one tablet by mouth twice daily  at 8am and 10pm for anxiety   MECLIZINE (ANTIVERT) 25 MG TABLET    Take 25 mg by mouth 3 (three) times daily as needed for dizziness.   MONTELUKAST (SINGULAIR) 10 MG TABLET    Take 10 mg by mouth at bedtime.   POLYETHYL GLYCOL-PROPYL GLYCOL (SYSTANE) 0.4-0.3 % SOLN    Place 1 drop into both eyes 2 (two) times daily.   POLYETHYLENE GLYCOL (MIRALAX / GLYCOLAX) PACKET    Take 17 g by mouth 2 (two) times daily.   SERTRALINE (ZOLOFT) 25 MG TABLET      Modified Medications   No medications on file  Discontinued Medications   No medications on file     Physical Exam:  Physical Exam  Constitutional: She is oriented to person, place, and time. She appears well-developed and well-nourished. No distress.  HENT:  Head: Normocephalic and atraumatic.  Right Ear: External ear normal.  Left Ear: External ear normal.  Nose: Nose normal.  Mouth/Throat: Oropharynx is clear and moist. No oropharyngeal exudate.  Eyes: Conjunctivae and EOM are normal. Pupils are equal, round, and reactive to light. Right eye exhibits no discharge. Left eye exhibits no discharge. No scleral icterus.  Neck: Normal range of motion. Neck supple. No JVD present. No tracheal deviation present. No thyromegaly present.  Cardiovascular: Normal rate and regular rhythm.  Exam reveals no gallop and no friction rub.   Murmur heard. 4/6  Pulmonary/Chest: Effort normal and breath sounds normal. No stridor. No respiratory distress. She has no wheezes. She has no rales. She exhibits no tenderness.  Abdominal: Soft. Bowel sounds are normal. She exhibits no distension and no mass. There is no tenderness. There is no rebound and no guarding.  Musculoskeletal: Normal range of motion. She exhibits edema. She exhibits no tenderness.  Chronic back pain. LLE swelling 1+ and aches   Lymphadenopathy:    She has no cervical adenopathy.  Neurological: She is alert and oriented to person, place, and time. She has normal reflexes. No cranial nerve  deficit. She exhibits normal muscle tone. Coordination abnormal.  Slight weakness LUE and LLE  Skin: Skin is warm and dry. No rash noted. She is not diaphoretic. No erythema. No pallor.  Psychiatric: She has a normal mood and affect. Her behavior is normal. Judgment and thought content normal.    Filed Vitals:  10/22/14 1620  BP: 140/70  Pulse: 72  Temp: 98.3 F (36.8 C)  TempSrc: Tympanic  Resp: 18      Labs reviewed: Basic Metabolic Panel:  Recent Labs  32/44/01 1720 05/07/14 2046 05/08/14 1440 05/09/14 0443  05/20/14 06/26/14 07/01/14 09/25/14  NA 136* 134*  --  138  < > 139 130*  --  140  K 4.4 4.2  --  4.0  < > 4.2 3.9  --  4.4  CL 97 102  --  102  --   --   --   --   --   CO2 23  --   --  23  --   --   --   --   --   GLUCOSE 98 107*  --  100*  --   --   --   --   --   BUN 13 14  --  12  < > 12 10  --  13  CREATININE 0.65 0.60  --  0.68  < > 0.7 0.6  --  0.8  CALCIUM 9.2  --   --  8.6  --   --   --   --   --   TSH  --   --  1.590  --   --  0.24*  --  2.34  --   < > = values in this interval not displayed. Liver Function Tests:  Recent Labs  09/25/14  AST 27  ALT 21  ALKPHOS 126*   No results for input(s): LIPASE, AMYLASE in the last 8760 hours. No results for input(s): AMMONIA in the last 8760 hours. CBC:  Recent Labs  05/06/14 0025  05/07/14 1720  05/09/14 0443 05/12/14 06/26/14 09/25/14  WBC 5.5  --  5.4  --  5.1 5.1 4.3 4.0  HGB 13.3  < > 14.4  < > 12.4 13.1 11.5* 11.2*  HCT 39.0  < > 43.4  < > 37.6 38 35* 35*  MCV 87.1  --  90.2  --  88.5  --   --   --   PLT 224  --  256  --  220 260 268 163  < > = values in this interval not displayed. Lipid Panel:  Recent Labs  05/08/14 0540  CHOL 198  HDL 55  LDLCALC 126*  TRIG 86  CHOLHDL 3.6    Past Procedures:  05/06/14 X-ray Pelvis:  IMPRESSION:  Successful reduction of previously noted left hip prosthesis  dislocation. No evidence of fracture or loosening.    05/07/14 CT head and  cervical spine w/o contrast:  IMPRESSION:  Probable acute to subacute right occipital infarct without evidence  of hemorrhage. Wallace Cullens matter involvement is not well demonstrated  raising the question that this could be related to vasogenic edema.  Degenerative changes in the cervical spine without evidence of an  acute fracture.   05/08/14 MRI brain:  IMPRESSION:  1. Acute, moderately large right PCA territory infarct. No  hemorrhage.  2. Fetal type origins of the PCAs with occlusion of the proximal P2  segment on the right.  3. Intracranial atherosclerosis with severe stenoses of the proximal  basilar artery and left P2 segment and mild to moderate stenoses of  the distal left vertebral artery, left P2 segment, and left MCA  bifurcation.  05/08/14 Echocardiogram:  Study Conclusions  - Left ventricle: The cavity size was normal. Wall thickness was increased in a pattern of moderate LVH. Systolic function was  normal. The estimated ejection fraction was in the range of 60% to 65%. - Aortic valve: There was mild to moderate stenosis. There was mild regurgitation - Mitral valve: There was mild regurgitation.  06/06/14 Duplex Venous US LLE: deep venous thrombosis at left common femoral and mid to proximal superficial femoral veins.    Assessment/Plan Acute upper respiratory infection Scratchy throat and hacking cough in setting of chronic rhinitis-will treat with 10 day course of Avelox and 5 day course of Mucinex-observe the patient.   Acute DVT (deep venous thrombosis) Left leg swelling, aches, feels heavy 06/06/14. 06/06/14 Duplex Venous US LLE: deep venous thrombosis at left common femoral and mid to proximal superficial femoral veins. Continue Eliqui 5mg  bid  07/01/14 Urology removed Estrogen ring 08/27/14 less edematous LLE 09/24/14 persisted edema LLE>RLL since onset of the DVT.  10/22/14 improved edema LLE     Chronic low back pain Not new. Pain is managed with  Hydrocodone/ApAp 5/325mg  bid and q6h prn.      Constipation Controlled, takes MiraLax 17gm +4 Oz water po bid.      CVA (cerebral infarction) C/o LUE and LLE numbness. Muscle strength 5/5  05/08/14 MRI brain:  IMPRESSION:  1. Acute, moderately large right PCA territory infarct. No  hemorrhage.  2. Fetal type origins of the PCAs with occlusion of the proximal P2  segment on the right.  3. Intracranial atherosclerosis with severe stenoses of the proximal  basilar artery and left P2 segment and mild to moderate stenoses of  the distal left vertebral artery, left P2 segment, and left MCA  bifurcation.  Follow up with the stroke MD in 2 weeks, Dr. Roda ShuttersXu.  Continue Atorvastatin for risk reduction.   09/24/14 continue Gabapentin for c/o numbness in L limbs per Neurology.     HTN (hypertension) 09/16/14 Neurology: Lisinopril 10mg  daily  09/24/14 controlled. Also takes Atorvastatin for cardiovascular risk reduction.    Hyponatremia 05/20/14 Na 139 06/26/14 Na 139 09/25/14 Na 140   Hypothyroidism 05/20/14 TSH 0.245 07/01/14 TSH 2.366 08/06/14 continue Levothyroxine 75mcg    Mixed anxiety depressive disorder The patient admitted she is depressed and desires antidepressant-started Sertraline 25mg  daily 06/26/14. No longer c/o "foggy head" will eliminate possible AR of medications: hold Zytec x 1wk-will change Zyrtec to prn.  09/24/14 stable, takes Ativan 0.5mg  bid, Sertraline 25mg  qhs.    Nonallergic rhinitis Chronic. Allergy profile 05/24/2011-negative, total IgE 4.7    Urinary hesitancy 06/30/14 US bladder: no significant abnormality evident, but initially only partially distended urinary bladder limiting evaluation. Post void residual 29ml.  She stated she has seen Dr. Urology and wears Estrogen ring(to be changed q3377m)-dc'd due to recent CVA and LLE DVT. Urinary hesitancy was resolved after treat with Nitrofurantoin 100mg  bid x 7 along with Florastor bid.  08/28/14 c/o  worsened urinary frequency-identified UTI-fully treated 09/24/14 c/o didn't think her UTI went away-getting up a lot at night-UA C/S and CBC in am 09/26/14 urine culture no growth 09/30/14 Urology: Leatrice JewelsLuvena vaginal Gel 3x/wk. Myrbetriq 25mg  daily.          Family/ Staff Communication: observe the patient  Goals of Care: SNF  Labs/tests ordered: none

## 2014-10-23 DIAGNOSIS — J069 Acute upper respiratory infection, unspecified: Secondary | ICD-10-CM | POA: Insufficient documentation

## 2014-10-23 NOTE — Assessment & Plan Note (Signed)
05/20/14 TSH 0.245 07/01/14 TSH 2.366 08/06/14 continue Levothyroxine 75mcg  

## 2014-10-23 NOTE — Assessment & Plan Note (Addendum)
06/30/14 US bladder: no significant abnormality evident, but initially only partially distended urinary bladder limiting evaluation. Post void residual 29ml.  She stated she has seen Dr. Urology and wears Estrogen ring(to be changed q3m)-dc'd due to recent CVA and LLE DVT. Urinary hesitancy was resolved after treat with Nitrofurantoin 100mg bid x 7 along with Florastor bid.  08/28/14 c/o worsened urinary frequency-identified UTI-fully treated 09/24/14 c/o didn't think her UTI went away-getting up a lot at night-UA C/S and CBC in am 09/26/14 urine culture no growth 09/30/14 Urology: Luvena vaginal Gel 3x/wk. Myrbetriq 25mg daily.     

## 2014-10-23 NOTE — Assessment & Plan Note (Signed)
09/16/14 Neurology: Lisinopril 10mg  daily  09/24/14 controlled. Also takes Atorvastatin for cardiovascular risk reduction.

## 2014-10-23 NOTE — Assessment & Plan Note (Signed)
Controlled, takes MiraLax 17gm +4 Oz water po bid.    

## 2014-10-23 NOTE — Assessment & Plan Note (Signed)
Not new. Pain is managed with Hydrocodone/ApAp 5/325mg  bid and q6h prn.

## 2014-10-23 NOTE — Assessment & Plan Note (Signed)
C/o LUE and LLE numbness. Muscle strength 5/5  05/08/14 MRI brain:  IMPRESSION:  1. Acute, moderately large right PCA territory infarct. No  hemorrhage.  2. Fetal type origins of the PCAs with occlusion of the proximal P2  segment on the right.  3. Intracranial atherosclerosis with severe stenoses of the proximal  basilar artery and left P2 segment and mild to moderate stenoses of  the distal left vertebral artery, left P2 segment, and left MCA  bifurcation.  Follow up with the stroke MD in 2 weeks, Dr. Xu.  Continue Atorvastatin for risk reduction.   09/24/14 continue Gabapentin for c/o numbness in L limbs per Neurology.   

## 2014-10-23 NOTE — Assessment & Plan Note (Addendum)
Chronic. Allergy profile 05/24/2011-negative, total IgE 4.7

## 2014-10-23 NOTE — Assessment & Plan Note (Signed)
Left leg swelling, aches, feels heavy 06/06/14. 06/06/14 Duplex Venous US LLE: deep venous thrombosis at left common femoral and mid to proximal superficial femoral veins. Continue Eliqui 5mg  bid  07/01/14 Urology removed Estrogen ring 08/27/14 less edematous LLE 09/24/14 persisted edema LLE>RLL since onset of the DVT.  10/22/14 improved edema LLE

## 2014-10-23 NOTE — Assessment & Plan Note (Signed)
Scratchy throat and hacking cough in setting of chronic rhinitis-will treat with 10 day course of Avelox and 5 day course of Mucinex-observe the patient.

## 2014-10-23 NOTE — Assessment & Plan Note (Signed)
The patient admitted she is depressed and desires antidepressant-started Sertraline 25mg daily 06/26/14. No longer c/o "foggy head" will eliminate possible AR of medications: hold Zytec x 1wk-will change Zyrtec to prn.  09/24/14 stable, takes Ativan 0.5mg bid, Sertraline 25mg qhs.    

## 2014-10-23 NOTE — Assessment & Plan Note (Signed)
05/20/14 Na 139 06/26/14 Na 139 09/25/14 Na 140

## 2014-11-03 ENCOUNTER — Ambulatory Visit (INDEPENDENT_AMBULATORY_CARE_PROVIDER_SITE_OTHER): Payer: Medicare Other

## 2014-11-03 DIAGNOSIS — I63531 Cerebral infarction due to unspecified occlusion or stenosis of right posterior cerebral artery: Secondary | ICD-10-CM

## 2014-11-03 DIAGNOSIS — Z0289 Encounter for other administrative examinations: Secondary | ICD-10-CM

## 2014-11-03 NOTE — Progress Notes (Unsigned)
  [facility] [address] [place]. [place] [phone]. [phone]       TRANSCRANIAL DOPPLER BUBBLE STUDY   Ms. Edrick KinsMarjorie D Binion Date of Birth:  11-19-1921 Medical Record Number:  [id number]   Indications: Diagnostic Date of Procedure: 11/03/2014 Clinical History:   [id number] year lady with stroke Technical Description:   Transcranial Doppler Bubble Study was performed at the bedside after taking written informed consent from the patient and explaining risk/benefits. Both middle cerebral arteries could not be insonated using a headset due to absent bitemporal windows hence procedure was dine with hand held probe insonation of right vertebral artery.. And IV line was inserted in the left forearm by the RN using aseptic precautions. Agitated saline injection at rest and after valsalva maneuver  Did not result in high intensity transient signals (HITS).   Impression:  Negative Transcranial Doppler Bubble Study i not indicative of right to left intracardiac shunt.   Results were explained to the patient. Questions were answered.

## 2014-11-19 ENCOUNTER — Encounter: Payer: Self-pay | Admitting: Nurse Practitioner

## 2014-11-19 ENCOUNTER — Non-Acute Institutional Stay (SKILLED_NURSING_FACILITY): Payer: Medicare Other | Admitting: Nurse Practitioner

## 2014-11-19 DIAGNOSIS — I82402 Acute embolism and thrombosis of unspecified deep veins of left lower extremity: Secondary | ICD-10-CM

## 2014-11-19 DIAGNOSIS — M545 Low back pain, unspecified: Secondary | ICD-10-CM

## 2014-11-19 DIAGNOSIS — R3911 Hesitancy of micturition: Secondary | ICD-10-CM

## 2014-11-19 DIAGNOSIS — I10 Essential (primary) hypertension: Secondary | ICD-10-CM

## 2014-11-19 DIAGNOSIS — G8929 Other chronic pain: Secondary | ICD-10-CM

## 2014-11-19 DIAGNOSIS — K5900 Constipation, unspecified: Secondary | ICD-10-CM

## 2014-11-19 DIAGNOSIS — E039 Hypothyroidism, unspecified: Secondary | ICD-10-CM

## 2014-11-19 DIAGNOSIS — F418 Other specified anxiety disorders: Secondary | ICD-10-CM

## 2014-11-19 NOTE — Assessment & Plan Note (Signed)
06/30/14 US bladder: no significant abnormality evident, but initially only partially distended urinary bladder limiting evaluation. Post void residual 29ml.  She stated she has seen Dr. Urology and wears Estrogen ring(to be changed q6874m)-dc'd due to recent CVA and LLE DVT. Urinary hesitancy was resolved after treat with Nitrofurantoin 100mg  bid x 7 along with Florastor bid.  08/28/14 c/o worsened urinary frequency-identified UTI-fully treated 09/24/14 c/o didn't think her UTI went away-getting up a lot at night-UA C/S and CBC in am 09/26/14 urine culture no growth 09/30/14 Urology: Leatrice JewelsLuvena vaginal Gel 3x/wk. Myrbetriq 25mg  daily.

## 2014-11-19 NOTE — Assessment & Plan Note (Signed)
Not new. Pain is managed with Hydrocodone/ApAp 5/325mg tid and q6h prn.    

## 2014-11-19 NOTE — Assessment & Plan Note (Signed)
C/o LUE and LLE numbness. Muscle strength 5/5  05/08/14 MRI brain:  IMPRESSION:  1. Acute, moderately large right PCA territory infarct. No  hemorrhage.  2. Fetal type origins of the PCAs with occlusion of the proximal P2  segment on the right.  3. Intracranial atherosclerosis with severe stenoses of the proximal  basilar artery and left P2 segment and mild to moderate stenoses of  the distal left vertebral artery, left P2 segment, and left MCA  bifurcation.  Follow up with the stroke MD in 2 weeks, Dr. Roda ShuttersXu.  Continue Atorvastatin for risk reduction.   09/24/14 continue Gabapentin for c/o numbness in L limbs per Neurology.   11/19/14 persisted but tolerable.

## 2014-11-19 NOTE — Progress Notes (Signed)
Patient ID: [name], female   DOB: 01-Mar-1922, 81 y.o.   MRN: [id number]   Code Status: DNR  Allergies  Allergen Reactions  . Augmentin [Amoxicillin-Pot Clavulanate] Nausea Only    Chief Complaint  Patient presents with  . Medical Management of Chronic Issues    HPI: Patient is a 81 y.o. female seen in the SNF at [facility] today for evaluation of chronic medical conditions.   Hospitalized from 05/07/2014-05/10/2014  For CVA (cerebral infarction) with mild left sided weakness and Dislocation of the left prosthesis-spontaneous dislocation of her left hip was reduced by orthopedics and anesthesia.  Head CT obtained shows probable acute to subacute right occipital infarct without evidence of hemorrhage however no significant grave matter of all meds with a question of vasogenic edema. Degenerative changes were noted on the cervical spine. Hip x-ray within normal limits.   Problem List Items Addressed This Visit    Urinary hesitancy    06/30/14 US bladder: no significant abnormality evident, but initially only partially distended urinary bladder limiting evaluation. Post void residual 29ml.  She stated she has seen Dr. Urology and wears Estrogen ring(to be changed q49m)-dc'd due to recent CVA and LLE DVT. Urinary hesitancy was resolved after treat with Nitrofurantoin 100mg  bid x 7 along with Florastor bid.  08/28/14 c/o worsened urinary frequency-identified UTI-fully treated 09/24/14 c/o didn't think her UTI went away-getting up a lot at night-UA C/S and CBC in am 09/26/14 urine culture no growth 09/30/14 Urology: Leatrice Jewels vaginal Gel 3x/wk. Myrbetriq 25mg  daily.          Mixed anxiety depressive disorder    The patient admitted she is depressed and desires antidepressant-started Sertraline 25mg  daily 06/26/14. No longer c/o "foggy head" will eliminate possible AR of medications: hold Zytec x 1wk-will change Zyrtec to prn.  --stable, takes Ativan 0.5mg  bid, Sertraline 25mg   qhs.         Hypothyroidism    05/20/14 TSH 0.245 07/01/14 TSH 2.366 Levothyroxine        HTN (hypertension) - Primary    09/16/14 Neurology: Lisinopril 10mg  daily  09/24/14 controlled. Also takes Atorvastatin for cardiovascular risk reduction.         Constipation    Controlled, takes MiraLax 17gm +4 Oz water po bid.          Chronic low back pain    Not new. Pain is managed with Hydrocodone/ApAp 5/325mg  tid and q6h prn.         Acute DVT (deep venous thrombosis)    Left leg swelling, aches, feels heavy 06/06/14. 06/06/14 Duplex Venous US LLE: deep venous thrombosis at left common femoral and mid to proximal superficial femoral veins. Continue Eliqui 5mg  bid  07/01/14 Urology removed Estrogen ring 08/27/14 less edematous LLE --trace edema LLE>RLL since onset of the DVT.               Review of Systems: Review of Systems  Constitutional: Negative for fever, chills, weight loss, malaise/fatigue and diaphoresis.  HENT: Positive for hearing loss. Negative for congestion, ear discharge, ear pain, nosebleeds, sore throat and tinnitus.   Eyes: Negative for blurred vision, double vision, photophobia, pain, discharge and redness.  Respiratory: Negative for cough, hemoptysis, sputum production, shortness of breath, wheezing and stridor.   Cardiovascular: Positive for leg swelling. Negative for chest pain, palpitations, orthopnea, claudication and PND.       LLE trace  Gastrointestinal: Negative for heartburn, nausea, vomiting, abdominal pain, diarrhea, constipation, blood in stool and melena.  Genitourinary: Positive for frequency. Negative for dysuria, urgency, hematuria and flank pain.  Musculoskeletal: Positive for back pain and joint pain. Negative for myalgias and neck pain.       Knees  Skin: Negative for itching and rash.       Dry scaly BLE  Neurological: Positive for tingling, sensory change and focal weakness. Negative for dizziness, tremors, speech change,  seizures, loss of consciousness, weakness and headaches.       LUE and LLE with mild weakness since stroke.   Endo/Heme/Allergies: Negative for environmental allergies and polydipsia. Bruises/bleeds easily.  Psychiatric/Behavioral: Positive for memory loss. Negative for depression, suicidal ideas, hallucinations and substance abuse. The patient is nervous/anxious. The patient does not have insomnia.      Past Medical History  Diagnosis Date  . Deviated nasal septum   . Dysfunction of eustachian tube   . Unspecified sinusitis (chronic)   . Allergic rhinitis, cause unspecified   . Arthritis   . Cataract   . Senile osteoporosis   . Hx of syncope     had loop recorder for 2 years now explanted, only PACs noted on device for over 2 years, no a fib, Echo 08/18/09 EF >55%mild concentric LVHnormal myoview, normal carotid dopplers 2010  . Pruritus of skin     poss. related to Losartan  . Hyperlipidemia   . Normal cardiac stress test     lexiscan myoview 04/08/08  . CVA (cerebral infarction) 2015  . Cerebral atrophy 2015  . Cerebrovascular disease 2015  . Dislocation of hip prosthesis 2015  . Lumbago 2015  . Unspecified hypothyroidism 2015  . Anxiety 2015  . Hyponatremia 2015  . Cervical spine degeneration 2015   Past Surgical History  Procedure Laterality Date  . Tonsillectomy    . Total hip arthroplasty      bilateral  . Pelvic sling    . Joint replacement    . Loop recorder implant  07/2009    to eval syncope  . Loop recorder explant  01/12/2012  . Loop recorder explant N/A 01/12/2012    Procedure: LOOP RECORDER EXPLANT;  Surgeon: Thurmon FairMihai Croitoru, MD;  Location: [facility] CATH LAB;  Service: Cardiovascular;  Laterality: N/A;   Social History:   reports that she has never smoked. She has never used smokeless tobacco. She reports that she does not drink alcohol or use illicit drugs.  Family History  Problem Relation Age of Onset  . Cancer Father     Medications: Patient's  Medications  New Prescriptions   No medications on file  Previous Medications   APIXABAN (ELIQUIS) 5 MG TABS TABLET    Take 5 mg by mouth 2 (two) times daily.   ATORVASTATIN (LIPITOR) 10 MG TABLET    Take 10 mg by mouth daily.   CETIRIZINE (ZYRTEC) 10 MG TABLET    Take 10 mg by mouth daily.   HYDROCODONE-ACETAMINOPHEN (NORCO/VICODIN) 5-325 MG PER TABLET    Take one tablet by mouth twice daily for pain   LEVOTHYROXINE (SYNTHROID, LEVOTHROID) 50 MCG TABLET    Take 50 mcg by mouth daily before breakfast.   LORAZEPAM (ATIVAN) 0.5 MG TABLET    Take one tablet by mouth twice daily at 8am and 10pm for anxiety   MECLIZINE (ANTIVERT) 25 MG TABLET    Take 25 mg by mouth 3 (three) times daily as needed for dizziness.   MONTELUKAST (SINGULAIR) 10 MG TABLET    Take 10 mg by mouth at bedtime.   POLYETHYL GLYCOL-PROPYL GLYCOL (SYSTANE)  0.4-0.3 % SOLN    Place 1 drop into both eyes 2 (two) times daily.   POLYETHYLENE GLYCOL (MIRALAX / GLYCOLAX) PACKET    Take 17 g by mouth 2 (two) times daily.   SERTRALINE (ZOLOFT) 25 MG TABLET      Modified Medications   No medications on file  Discontinued Medications   No medications on file     Physical Exam:  Physical Exam  Constitutional: She is oriented to person, place, and time. She appears well-developed and well-nourished. No distress.  HENT:  Head: Normocephalic and atraumatic.  Right Ear: External ear normal.  Left Ear: External ear normal.  Nose: Nose normal.  Mouth/Throat: Oropharynx is clear and moist. No oropharyngeal exudate.  Eyes: Conjunctivae and EOM are normal. Pupils are equal, round, and reactive to light. Right eye exhibits no discharge. Left eye exhibits no discharge. No scleral icterus.  Neck: Normal range of motion. Neck supple. No JVD present. No tracheal deviation present. No thyromegaly present.  Cardiovascular: Normal rate and regular rhythm.  Exam reveals no gallop and no friction rub.   Murmur heard. 4/6  Pulmonary/Chest: Effort  normal and breath sounds normal. No stridor. No respiratory distress. She has no wheezes. She has no rales. She exhibits no tenderness.  Abdominal: Soft. Bowel sounds are normal. She exhibits no distension and no mass. There is no tenderness. There is no rebound and no guarding.  Musculoskeletal: Normal range of motion. She exhibits edema. She exhibits no tenderness.  Chronic back pain. LLE swelling 1+ and aches   Lymphadenopathy:    She has no cervical adenopathy.  Neurological: She is alert and oriented to person, place, and time. She has normal reflexes. No cranial nerve deficit. She exhibits normal muscle tone. Coordination abnormal.  Slight weakness LUE and LLE  Skin: Skin is warm and dry. No rash noted. She is not diaphoretic. No erythema. No pallor.  Psychiatric: She has a normal mood and affect. Her behavior is normal. Judgment and thought content normal.    Filed Vitals:   11/19/14 1327  BP: 130/60  Pulse: 66  Temp: 98.2 F (36.8 C)  TempSrc: Tympanic  Resp: 16      Labs reviewed: Basic Metabolic Panel:  Recent Labs  27/25/36 1720 05/07/14 2046 05/08/14 1440 05/09/14 0443  05/20/14 06/26/14 07/01/14 09/25/14  NA 136* 134*  --  138  < > 139 130*  --  140  K 4.4 4.2  --  4.0  < > 4.2 3.9  --  4.4  CL 97 102  --  102  --   --   --   --   --   CO2 23  --   --  23  --   --   --   --   --   GLUCOSE 98 107*  --  100*  --   --   --   --   --   BUN 13 14  --  12  < > 12 10  --  13  CREATININE 0.65 0.60  --  0.68  < > 0.7 0.6  --  0.8  CALCIUM 9.2  --   --  8.6  --   --   --   --   --   TSH  --   --  1.590  --   --  0.24*  --  2.34  --   < > = values in this interval not displayed. Liver Function Tests:  Recent  Labs  09/25/14  AST 27  ALT 21  ALKPHOS 126*   No results for input(s): LIPASE, AMYLASE in the last 8760 hours. No results for input(s): AMMONIA in the last 8760 hours. CBC:  Recent Labs  05/06/14 0025  05/07/14 1720  05/09/14 0443 05/12/14 06/26/14  09/25/14  WBC 5.5  --  5.4  --  5.1 5.1 4.3 4.0  HGB 13.3  < > 14.4  < > 12.4 13.1 11.5* 11.2*  HCT 39.0  < > 43.4  < > 37.6 38 35* 35*  MCV 87.1  --  90.2  --  88.5  --   --   --   PLT 224  --  256  --  220 260 268 163  < > = values in this interval not displayed. Lipid Panel:  Recent Labs  05/08/14 0540  CHOL 198  HDL 55  LDLCALC 126*  TRIG 86  CHOLHDL 3.6    Past Procedures:  05/06/14 X-ray Pelvis:  IMPRESSION:  Successful reduction of previously noted left hip prosthesis  dislocation. No evidence of fracture or loosening.    05/07/14 CT head and cervical spine w/o contrast:  IMPRESSION:  Probable acute to subacute right occipital infarct without evidence  of hemorrhage. Wallace Cullens matter involvement is not well demonstrated  raising the question that this could be related to vasogenic edema.  Degenerative changes in the cervical spine without evidence of an  acute fracture.   05/08/14 MRI brain:  IMPRESSION:  1. Acute, moderately large right PCA territory infarct. No  hemorrhage.  2. Fetal type origins of the PCAs with occlusion of the proximal P2  segment on the right.  3. Intracranial atherosclerosis with severe stenoses of the proximal  basilar artery and left P2 segment and mild to moderate stenoses of  the distal left vertebral artery, left P2 segment, and left MCA  bifurcation.  05/08/14 Echocardiogram:  Study Conclusions  - Left ventricle: The cavity size was normal. Wall thickness was increased in a pattern of moderate LVH. Systolic function was normal. The estimated ejection fraction was in the range of 60% to 65%. - Aortic valve: There was mild to moderate stenosis. There was mild regurgitation - Mitral valve: There was mild regurgitation.  06/06/14 Duplex Venous US LLE: deep venous thrombosis at left common femoral and mid to proximal superficial femoral veins.    Assessment/Plan Hypothyroidism 05/20/14 TSH 0.245 07/01/14 TSH 2.366 Levothyroxine      HTN (hypertension) 09/16/14 Neurology: Lisinopril 10mg  daily  09/24/14 controlled. Also takes Atorvastatin for cardiovascular risk reduction.      Urinary hesitancy 06/30/14 US bladder: no significant abnormality evident, but initially only partially distended urinary bladder limiting evaluation. Post void residual 29ml.  She stated she has seen Dr. Urology and wears Estrogen ring(to be changed q17m)-dc'd due to recent CVA and LLE DVT. Urinary hesitancy was resolved after treat with Nitrofurantoin 100mg  bid x 7 along with Florastor bid.  08/28/14 c/o worsened urinary frequency-identified UTI-fully treated 09/24/14 c/o didn't think her UTI went away-getting up a lot at night-UA C/S and CBC in am 09/26/14 urine culture no growth 09/30/14 Urology: Leatrice Jewels vaginal Gel 3x/wk. Myrbetriq 25mg  daily.       Mixed anxiety depressive disorder The patient admitted she is depressed and desires antidepressant-started Sertraline 25mg  daily 06/26/14. No longer c/o "foggy head" will eliminate possible AR of medications: hold Zytec x 1wk-will change Zyrtec to prn.  --stable, takes Ativan 0.5mg  bid, Sertraline 25mg  qhs.  CVA (cerebral infarction) C/o LUE and LLE numbness. Muscle strength 5/5  05/08/14 MRI brain:  IMPRESSION:  1. Acute, moderately large right PCA territory infarct. No  hemorrhage.  2. Fetal type origins of the PCAs with occlusion of the proximal P2  segment on the right.  3. Intracranial atherosclerosis with severe stenoses of the proximal  basilar artery and left P2 segment and mild to moderate stenoses of  the distal left vertebral artery, left P2 segment, and left MCA  bifurcation.  Follow up with the stroke MD in 2 weeks, Dr. Roda Shutters.  Continue Atorvastatin for risk reduction.   09/24/14 continue Gabapentin for c/o numbness in L limbs per Neurology.   11/19/14 persisted but tolerable.      Constipation Controlled, takes MiraLax 17gm +4 Oz water po bid.        Chronic low back pain Not new. Pain is managed with Hydrocodone/ApAp 5/325mg  tid and q6h prn.      Acute DVT (deep venous thrombosis) Left leg swelling, aches, feels heavy 06/06/14. 06/06/14 Duplex Venous US LLE: deep venous thrombosis at left common femoral and mid to proximal superficial femoral veins. Continue Eliqui  bid  07/01/14 Urology removed Estrogen ring 08/27/14 less edematous LLE --trace edema LLE>RLL since onset of the DVT.           Family/ Staff Communication: observe the patient  Goals of Care: SNF  Labs/tests ordered: none

## 2014-11-19 NOTE — Assessment & Plan Note (Signed)
The patient admitted she is depressed and desires antidepressant-started Sertraline 25mg  daily 06/26/14. No longer c/o "foggy head" will eliminate possible AR of medications: hold Zytec x 1wk-will change Zyrtec to prn.  --stable, takes Ativan 0.5mg  bid, Sertraline 25mg  qhs.

## 2014-11-19 NOTE — Assessment & Plan Note (Signed)
Left leg swelling, aches, feels heavy 06/06/14. 06/06/14 Duplex Venous US LLE: deep venous thrombosis at left common femoral and mid to proximal superficial femoral veins. Continue Eliqui 5mg bid  07/01/14 Urology removed Estrogen ring 08/27/14 less edematous LLE --trace edema LLE>RLL since onset of the DVT.   

## 2014-11-19 NOTE — Assessment & Plan Note (Signed)
05/20/14 TSH 0.245 07/01/14 TSH 2.366 Levothyroxine 50mcg

## 2014-11-19 NOTE — Assessment & Plan Note (Signed)
Controlled, takes MiraLax 17gm +4 Oz water po bid.    

## 2014-11-19 NOTE — Assessment & Plan Note (Signed)
09/16/14 Neurology: Lisinopril 10mg daily  09/24/14 controlled. Also takes Atorvastatin for cardiovascular risk reduction.   

## 2014-12-03 ENCOUNTER — Other Ambulatory Visit: Payer: Self-pay | Admitting: Nurse Practitioner

## 2014-12-03 DIAGNOSIS — H6980 Other specified disorders of Eustachian tube, unspecified ear: Secondary | ICD-10-CM

## 2014-12-18 ENCOUNTER — Encounter: Payer: Self-pay | Admitting: Nurse Practitioner

## 2014-12-18 ENCOUNTER — Non-Acute Institutional Stay (SKILLED_NURSING_FACILITY): Payer: Medicare Other | Admitting: Nurse Practitioner

## 2014-12-18 DIAGNOSIS — K5900 Constipation, unspecified: Secondary | ICD-10-CM

## 2014-12-18 DIAGNOSIS — E039 Hypothyroidism, unspecified: Secondary | ICD-10-CM

## 2014-12-18 DIAGNOSIS — H6980 Other specified disorders of Eustachian tube, unspecified ear: Secondary | ICD-10-CM

## 2014-12-18 DIAGNOSIS — G8929 Other chronic pain: Secondary | ICD-10-CM | POA: Diagnosis not present

## 2014-12-18 DIAGNOSIS — M545 Low back pain: Secondary | ICD-10-CM

## 2014-12-18 DIAGNOSIS — I82409 Acute embolism and thrombosis of unspecified deep veins of unspecified lower extremity: Secondary | ICD-10-CM

## 2014-12-18 DIAGNOSIS — I10 Essential (primary) hypertension: Secondary | ICD-10-CM | POA: Diagnosis not present

## 2014-12-18 DIAGNOSIS — F418 Other specified anxiety disorders: Secondary | ICD-10-CM

## 2014-12-18 DIAGNOSIS — R3911 Hesitancy of micturition: Secondary | ICD-10-CM | POA: Diagnosis not present

## 2014-12-18 DIAGNOSIS — I6300 Cerebral infarction due to thrombosis of unspecified precerebral artery: Secondary | ICD-10-CM

## 2014-12-18 NOTE — Assessment & Plan Note (Signed)
09/16/14 Neurology: Lisinopril 10mg  daily  09/24/14 controlled. Also takes Atorvastatin for cardiovascular risk reduction.  12/18/14 update CMP

## 2014-12-18 NOTE — Assessment & Plan Note (Signed)
The patient admitted she is depressed and desires antidepressant-started Sertraline 25mg  daily 06/26/14. No longer c/o "foggy head" will eliminate possible AR of medications: hold Zytec x 1wk-will change Zyrtec to prn.  --stable, takes Ativan 0.5mg  bid, Sertraline 25mg  qhs.

## 2014-12-18 NOTE — Progress Notes (Signed)
Patient ID: [name], female   DOB: 06/14/1922, 81 y.o.   MRN: [id number]   Code Status: DNR  Allergies  Allergen Reactions  . Augmentin [Amoxicillin-Pot Clavulanate] Nausea Only    Chief Complaint  Patient presents with  . Medical Management of Chronic Issues    HPI: Patient is a 81 y.o. female seen in the SNF at [facility] today for evaluation of chronic medical conditions.   Hospitalized from 05/07/2014-05/10/2014  For CVA (cerebral infarction) with mild left sided weakness and Dislocation of the left prosthesis-spontaneous dislocation of her left hip was reduced by orthopedics and anesthesia.  Head CT obtained shows probable acute to subacute right occipital infarct without evidence of hemorrhage however no significant grave matter of all meds with a question of vasogenic edema. Degenerative changes were noted on the cervical spine. Hip x-ray within normal limits.   Problem List Items Addressed This Visit    Urinary hesitancy    06/30/14 US bladder: no significant abnormality evident, but initially only partially distended urinary bladder limiting evaluation. Post void residual 29ml.  She stated she has seen Dr. Urology and wears Estrogen ring(to be changed q63m)-dc'd due to recent CVA and LLE DVT. Urinary hesitancy was resolved after treat with Nitrofurantoin  bid x 7 along with Florastor bid.  08/28/14 c/o worsened urinary frequency-identified UTI-fully treated 09/24/14 c/o didn't think her UTI went away-getting up a lot at night-UA C/S and CBC in am 09/26/14 urine culture no growth 09/30/14 Urology: Leatrice Jewels vaginal Gel 3x/wk. Myrbetriq  daily.  12/18/14 stabilized.         Mixed anxiety depressive disorder    The patient admitted she is depressed and desires antidepressant-started Sertraline  daily 06/26/14. No longer c/o "foggy head" will eliminate possible AR of medications: hold Zytec x 1wk-will change Zyrtec to prn.  --stable, takes Ativan 0.5mg   bid, Sertraline  qhs.          Hypothyroidism - Primary    05/20/14 TSH 0.245 07/01/14 TSH 2.366 Levothyroxine 72mcg-update TSH      HTN (hypertension)    09/16/14 Neurology: Lisinopril  daily  09/24/14 controlled. Also takes Atorvastatin for cardiovascular risk reduction.  12/18/14 update CMP         EUSTACHIAN TUBE DYSFUNCTION    12/03/14 dc prn Antivert due to lack of use.       CVA (cerebral infarction)    Continue Atorvastatin for risk reduction.   09/24/14 continue Gabapentin for c/o numbness in L limbs per Neurology.   11/19/14 persisted but tolerable.        Constipation    Controlled, takes MiraLax 17gm +4 Oz water po qd 12/18/14        Chronic low back pain    Not new. Pain is managed with Hydrocodone/ApAp 5/325mg  tid and q6h prn.        Acute DVT (deep venous thrombosis)    Left leg swelling, aches, feels heavy 06/06/14. 06/06/14 Duplex Venous US LLE: deep venous thrombosis at left common femoral and mid to proximal superficial femoral veins. Continue Eliqui  bid  07/01/14 Urology removed Estrogen ring 08/27/14 less edematous LLE --trace edema LLE>RLL since onset of the DVT.            Review of Systems: Review of Systems  Constitutional: Negative for fever, chills, weight loss, malaise/fatigue and diaphoresis.  HENT: Positive for hearing loss. Negative for congestion, ear discharge, ear pain, nosebleeds, sore throat and tinnitus.   Eyes: Negative for blurred vision, double vision,  photophobia, pain, discharge and redness.  Respiratory: Negative for cough, hemoptysis, sputum production, shortness of breath, wheezing and stridor.   Cardiovascular: Positive for leg swelling. Negative for chest pain, palpitations, orthopnea, claudication and PND.  Gastrointestinal: Negative for heartburn, nausea, vomiting, abdominal pain, diarrhea, constipation, blood in stool and melena.  Genitourinary: Positive for frequency. Negative for dysuria, urgency,  hematuria and flank pain.  Musculoskeletal: Positive for joint pain. Negative for myalgias, back pain, falls and neck pain.  Skin: Negative for itching and rash.  Neurological: Positive for tingling, sensory change and focal weakness. Negative for dizziness, tremors, seizures, loss of consciousness, weakness and headaches.  Endo/Heme/Allergies: Negative for environmental allergies and polydipsia. Bruises/bleeds easily.  Psychiatric/Behavioral: Positive for depression and memory loss. Negative for suicidal ideas, hallucinations and substance abuse. The patient is nervous/anxious. The patient does not have insomnia.      Past Medical History  Diagnosis Date  . Deviated nasal septum   . Dysfunction of eustachian tube   . Unspecified sinusitis (chronic)   . Allergic rhinitis, cause unspecified   . Arthritis   . Cataract   . Senile osteoporosis   . Hx of syncope     had loop recorder for 2 years now explanted, only PACs noted on device for over 2 years, no a fib, Echo 08/18/09 EF >55%mild concentric LVHnormal myoview, normal carotid dopplers 2010  . Pruritus of skin     poss. related to Losartan  . Hyperlipidemia   . Normal cardiac stress test     lexiscan myoview 04/08/08  . CVA (cerebral infarction) 2015  . Cerebral atrophy 2015  . Cerebrovascular disease 2015  . Dislocation of hip prosthesis 2015  . Lumbago 2015  . Unspecified hypothyroidism 2015  . Anxiety 2015  . Hyponatremia 2015  . Cervical spine degeneration 2015   Past Surgical History  Procedure Laterality Date  . Tonsillectomy    . Total hip arthroplasty      bilateral  . Pelvic sling    . Joint replacement    . Loop recorder implant  07/2009    to eval syncope  . Loop recorder explant  01/12/2012  . Loop recorder explant N/A 01/12/2012    Procedure: LOOP RECORDER EXPLANT;  Surgeon: Thurmon Fair, MD;  Location: [facility] CATH LAB;  Service: Cardiovascular;  Laterality: N/A;   Social History:   reports that she has never  smoked. She has never used smokeless tobacco. She reports that she does not drink alcohol or use illicit drugs.  Family History  Problem Relation Age of Onset  . Cancer Father     Medications: Patient's Medications  New Prescriptions   No medications on file  Previous Medications   APIXABAN (ELIQUIS) 5 MG TABS TABLET    Take 5 mg by mouth 2 (two) times daily.   ATORVASTATIN (LIPITOR) 10 MG TABLET    Take 10 mg by mouth daily.   CETIRIZINE (ZYRTEC) 10 MG TABLET    Take 10 mg by mouth daily.   HYDROCODONE-ACETAMINOPHEN (NORCO/VICODIN) 5-325 MG PER TABLET    Take one tablet by mouth twice daily for pain   LEVOTHYROXINE (SYNTHROID, LEVOTHROID) 50 MCG TABLET    Take 50 mcg by mouth daily before breakfast.   LORAZEPAM (ATIVAN) 0.5 MG TABLET    Take one tablet by mouth twice daily at 8am and 10pm for anxiety   MONTELUKAST (SINGULAIR) 10 MG TABLET    Take 10 mg by mouth at bedtime.   POLYETHYL GLYCOL-PROPYL GLYCOL (SYSTANE) 0.4-0.3 % SOLN  Place 1 drop into both eyes 2 (two) times daily.   POLYETHYLENE GLYCOL (MIRALAX / GLYCOLAX) PACKET    Take 17 g by mouth 2 (two) times daily.   SERTRALINE (ZOLOFT) 25 MG TABLET      Modified Medications   No medications on file  Discontinued Medications   No medications on file     Physical Exam:  Physical Exam  Constitutional: She is oriented to person, place, and time. She appears well-developed and well-nourished. No distress.  HENT:  Head: Normocephalic and atraumatic.  Right Ear: External ear normal.  Left Ear: External ear normal.  Nose: Nose normal.  Mouth/Throat: Oropharynx is clear and moist. No oropharyngeal exudate.  Eyes: Conjunctivae and EOM are normal. Pupils are equal, round, and reactive to light. Right eye exhibits no discharge. Left eye exhibits no discharge. No scleral icterus.  Neck: Normal range of motion. Neck supple. No JVD present. No tracheal deviation present. No thyromegaly present.  Cardiovascular: Normal rate and  regular rhythm.  Exam reveals no gallop and no friction rub.   Murmur heard. 4/6  Pulmonary/Chest: Effort normal and breath sounds normal. No stridor. No respiratory distress. She has no wheezes. She has no rales. She exhibits no tenderness.  Abdominal: Soft. Bowel sounds are normal. She exhibits no distension and no mass. There is no tenderness. There is no rebound and no guarding.  Musculoskeletal: Normal range of motion. She exhibits edema. She exhibits no tenderness.  Chronic back pain. LLE swelling 1+ and aches   Lymphadenopathy:    She has no cervical adenopathy.  Neurological: She is alert and oriented to person, place, and time. She has normal reflexes. No cranial nerve deficit. She exhibits normal muscle tone. Coordination abnormal.  Slight weakness LUE and LLE  Skin: Skin is warm and dry. No rash noted. She is not diaphoretic. No erythema. No pallor.  Psychiatric: She has a normal mood and affect. Her behavior is normal. Judgment and thought content normal.    Filed Vitals:   12/18/14 1502  BP: 125/61  Pulse: 59  Temp: 98.8 F (37.1 C)  TempSrc: Tympanic  Resp: 18      Labs reviewed: Basic Metabolic Panel:  Recent Labs  16/10/96 1720 05/07/14 2046 05/08/14 1440 05/09/14 0443  05/20/14 06/26/14 07/01/14 09/25/14  NA 136* 134*  --  138  < > 139 130*  --  140  K 4.4 4.2  --  4.0  < > 4.2 3.9  --  4.4  CL 97 102  --  102  --   --   --   --   --   CO2 23  --   --  23  --   --   --   --   --   GLUCOSE 98 107*  --  100*  --   --   --   --   --   BUN 13 14  --  12  < > 12 10  --  13  CREATININE 0.65 0.60  --  0.68  < > 0.7 0.6  --  0.8  CALCIUM 9.2  --   --  8.6  --   --   --   --   --   TSH  --   --  1.590  --   --  0.24*  --  2.34  --   < > = values in this interval not displayed. Liver Function Tests:  Recent Labs  09/25/14  AST 27  ALT 21  ALKPHOS 126*   No results for input(s): LIPASE, AMYLASE in the last 8760 hours. No results for input(s): AMMONIA in  the last 8760 hours. CBC:  Recent Labs  05/06/14 0025  05/07/14 1720  05/09/14 0443 05/12/14 06/26/14 09/25/14  WBC 5.5  --  5.4  --  5.1 5.1 4.3 4.0  HGB 13.3  < > 14.4  < > 12.4 13.1 11.5* 11.2*  HCT 39.0  < > 43.4  < > 37.6 38 35* 35*  MCV 87.1  --  90.2  --  88.5  --   --   --   PLT 224  --  256  --  220 260 268 163  < > = values in this interval not displayed. Lipid Panel:  Recent Labs  05/08/14 0540  CHOL 198  HDL 55  LDLCALC 126*  TRIG 86  CHOLHDL 3.6    Past Procedures:  05/06/14 X-ray Pelvis:  IMPRESSION:  Successful reduction of previously noted left hip prosthesis  dislocation. No evidence of fracture or loosening.    05/07/14 CT head and cervical spine w/o contrast:  IMPRESSION:  Probable acute to subacute right occipital infarct without evidence  of hemorrhage. Wallace CullensGray matter involvement is not well demonstrated  raising the question that this could be related to vasogenic edema.  Degenerative changes in the cervical spine without evidence of an  acute fracture.   05/08/14 MRI brain:  IMPRESSION:  1. Acute, moderately large right PCA territory infarct. No  hemorrhage.  2. Fetal type origins of the PCAs with occlusion of the proximal P2  segment on the right.  3. Intracranial atherosclerosis with severe stenoses of the proximal  basilar artery and left P2 segment and mild to moderate stenoses of  the distal left vertebral artery, left P2 segment, and left MCA  bifurcation.  05/08/14 Echocardiogram:  Study Conclusions  - Left ventricle: The cavity size was normal. Wall thickness was increased in a pattern of moderate LVH. Systolic function was normal. The estimated ejection fraction was in the range of 60% to 65%. - Aortic valve: There was mild to moderate stenosis. There was mild regurgitation - Mitral valve: There was mild regurgitation.  06/06/14 Duplex Venous US LLE: deep venous thrombosis at left common femoral and mid to proximal  superficial femoral veins.    Assessment/Plan Urinary hesitancy 06/30/14 US bladder: no significant abnormality evident, but initially only partially distended urinary bladder limiting evaluation. Post void residual 29ml.  She stated she has seen Dr. Urology and wears Estrogen ring(to be changed q4137m)-dc'd due to recent CVA and LLE DVT. Urinary hesitancy was resolved after treat with Nitrofurantoin 100mg  bid x 7 along with Florastor bid.  08/28/14 c/o worsened urinary frequency-identified UTI-fully treated 09/24/14 c/o didn't think her UTI went away-getting up a lot at night-UA C/S and CBC in am 09/26/14 urine culture no growth 09/30/14 Urology: Leatrice JewelsLuvena vaginal Gel 3x/wk. Myrbetriq 25mg  daily.  12/18/14 stabilized.      Mixed anxiety depressive disorder The patient admitted she is depressed and desires antidepressant-started Sertraline 25mg  daily 06/26/14. No longer c/o "foggy head" will eliminate possible AR of medications: hold Zytec x 1wk-will change Zyrtec to prn.  --stable, takes Ativan 0.5mg  bid, Sertraline 25mg  qhs.       Hypothyroidism 05/20/14 TSH 0.245 07/01/14 TSH 2.366 Levothyroxine 3550mcg-update TSH   HTN (hypertension) 09/16/14 Neurology: Lisinopril 10mg  daily  09/24/14 controlled. Also takes Atorvastatin for cardiovascular risk reduction.  12/18/14 update CMP      EUSTACHIAN  TUBE DYSFUNCTION 12/03/14 dc prn Antivert due to lack of use.    CVA (cerebral infarction) Continue Atorvastatin for risk reduction.   09/24/14 continue Gabapentin for c/o numbness in L limbs per Neurology.   11/19/14 persisted but tolerable.     Constipation Controlled, takes MiraLax 17gm +4 Oz water po qd 12/18/14     Chronic low back pain Not new. Pain is managed with Hydrocodone/ApAp 5/325mg  tid and q6h prn.     Acute DVT (deep venous thrombosis) Left leg swelling, aches, feels heavy 06/06/14. 06/06/14 Duplex Venous US LLE: deep venous thrombosis at left common femoral and mid to  proximal superficial femoral veins. Continue Eliqui 5mg  bid  07/01/14 Urology removed Estrogen ring 08/27/14 less edematous LLE --trace edema LLE>RLL since onset of the DVT.        Family/ Staff Communication: observe the patient  Goals of Care: SNF  Labs/tests ordered: CBC, CMP, TSH

## 2014-12-18 NOTE — Assessment & Plan Note (Signed)
12/03/14 dc prn Antivert due to lack of use.  

## 2014-12-18 NOTE — Assessment & Plan Note (Addendum)
Controlled, takes MiraLax 17gm +4 Oz water po qd 12/18/14   

## 2014-12-18 NOTE — Assessment & Plan Note (Signed)
Continue Atorvastatin for risk reduction.   09/24/14 continue Gabapentin for c/o numbness in L limbs per Neurology.   11/19/14 persisted but tolerable.

## 2014-12-18 NOTE — Assessment & Plan Note (Signed)
06/30/14 US bladder: no significant abnormality evident, but initially only partially distended urinary bladder limiting evaluation. Post void residual 29ml.  She stated she has seen Dr. Urology and wears Estrogen ring(to be changed q6323m)-dc'd due to recent CVA and LLE DVT. Urinary hesitancy was resolved after treat with Nitrofurantoin 100mg  bid x 7 along with Florastor bid.  08/28/14 c/o worsened urinary frequency-identified UTI-fully treated 09/24/14 c/o didn't think her UTI went away-getting up a lot at night-UA C/S and CBC in am 09/26/14 urine culture no growth 09/30/14 Urology: Leatrice JewelsLuvena vaginal Gel 3x/wk. Myrbetriq 25mg  daily.  12/18/14 stabilized.

## 2014-12-18 NOTE — Assessment & Plan Note (Signed)
05/20/14 TSH 0.245 07/01/14 TSH 2.366 Levothyroxine 3950mcg-update TSH

## 2014-12-18 NOTE — Assessment & Plan Note (Signed)
Left leg swelling, aches, feels heavy 06/06/14. 06/06/14 Duplex Venous US LLE: deep venous thrombosis at left common femoral and mid to proximal superficial femoral veins. Continue Eliqui 5mg bid  07/01/14 Urology removed Estrogen ring 08/27/14 less edematous LLE --trace edema LLE>RLL since onset of the DVT.   

## 2014-12-18 NOTE — Assessment & Plan Note (Signed)
Not new. Pain is managed with Hydrocodone/ApAp 5/325mg tid and q6h prn.    

## 2014-12-19 ENCOUNTER — Emergency Department (HOSPITAL_COMMUNITY)
Admission: EM | Admit: 2014-12-19 | Discharge: 2014-12-20 | Disposition: A | Discharge status: Home / Self Care | Payer: Medicare Other | Attending: Emergency Medicine | Admitting: Emergency Medicine

## 2014-12-19 ENCOUNTER — Emergency Department (HOSPITAL_COMMUNITY): Payer: Medicare Other

## 2014-12-19 ENCOUNTER — Other Ambulatory Visit: Payer: Self-pay | Admitting: *Deleted

## 2014-12-19 ENCOUNTER — Encounter (HOSPITAL_COMMUNITY): Payer: Self-pay

## 2014-12-19 DIAGNOSIS — R001 Bradycardia, unspecified: Secondary | ICD-10-CM | POA: Diagnosis not present

## 2014-12-19 DIAGNOSIS — F419 Anxiety disorder, unspecified: Secondary | ICD-10-CM | POA: Insufficient documentation

## 2014-12-19 DIAGNOSIS — M1990 Unspecified osteoarthritis, unspecified site: Secondary | ICD-10-CM | POA: Diagnosis not present

## 2014-12-19 DIAGNOSIS — Z8673 Personal history of transient ischemic attack (TIA), and cerebral infarction without residual deficits: Secondary | ICD-10-CM | POA: Diagnosis not present

## 2014-12-19 DIAGNOSIS — S0001XA Abrasion of scalp, initial encounter: Secondary | ICD-10-CM

## 2014-12-19 DIAGNOSIS — Z872 Personal history of diseases of the skin and subcutaneous tissue: Secondary | ICD-10-CM | POA: Diagnosis not present

## 2014-12-19 DIAGNOSIS — Z8669 Personal history of other diseases of the nervous system and sense organs: Secondary | ICD-10-CM | POA: Diagnosis not present

## 2014-12-19 DIAGNOSIS — E785 Hyperlipidemia, unspecified: Secondary | ICD-10-CM | POA: Insufficient documentation

## 2014-12-19 DIAGNOSIS — Y9389 Activity, other specified: Secondary | ICD-10-CM | POA: Insufficient documentation

## 2014-12-19 DIAGNOSIS — S0003XA Contusion of scalp, initial encounter: Secondary | ICD-10-CM | POA: Diagnosis not present

## 2014-12-19 DIAGNOSIS — W01198A Fall on same level from slipping, tripping and stumbling with subsequent striking against other object, initial encounter: Secondary | ICD-10-CM | POA: Insufficient documentation

## 2014-12-19 DIAGNOSIS — S0990XA Unspecified injury of head, initial encounter: Secondary | ICD-10-CM | POA: Diagnosis present

## 2014-12-19 DIAGNOSIS — Z79899 Other long term (current) drug therapy: Secondary | ICD-10-CM | POA: Insufficient documentation

## 2014-12-19 DIAGNOSIS — W19XXXA Unspecified fall, initial encounter: Secondary | ICD-10-CM

## 2014-12-19 DIAGNOSIS — Z7901 Long term (current) use of anticoagulants: Secondary | ICD-10-CM | POA: Insufficient documentation

## 2014-12-19 DIAGNOSIS — Y9289 Other specified places as the place of occurrence of the external cause: Secondary | ICD-10-CM | POA: Diagnosis not present

## 2014-12-19 DIAGNOSIS — E039 Hypothyroidism, unspecified: Secondary | ICD-10-CM | POA: Insufficient documentation

## 2014-12-19 DIAGNOSIS — Y998 Other external cause status: Secondary | ICD-10-CM | POA: Insufficient documentation

## 2014-12-19 DIAGNOSIS — M810 Age-related osteoporosis without current pathological fracture: Secondary | ICD-10-CM | POA: Diagnosis not present

## 2014-12-19 MED ORDER — LORAZEPAM 0.5 MG PO TABS: ORAL_TABLET | ORAL | Status: AC

## 2014-12-19 MED ORDER — HYDROCODONE-ACETAMINOPHEN 5-325 MG PO TABS: ORAL_TABLET | ORAL | Status: DC

## 2014-12-19 NOTE — Telephone Encounter (Signed)
[facility] Fax Order 

## 2014-12-19 NOTE — ED Notes (Signed)
Per EMS - pt tripped over pajamas on floor, has large hematoma to back of head. Pt c/o headache. Pt on Eliquis. Hypertensive upon EMS arrival (200/80), last BP 180/80, pulse 110bpm. A&O x4, denies any LOC, able to move all extremities. Previous hx of stroke with residual left-sided weakness.

## 2014-12-19 NOTE — ED Provider Notes (Signed)
CSN: [id number]     Arrival date & time 12/19/14  2118 History   First MD Initiated Contact with Patient 12/19/14 2132     Chief Complaint  Patient presents with  . Fall     Patient is a 81 y.o. female presenting with fall. The history is provided by the patient and the EMS personnel. No language interpreter was used.  Fall   [name] presents for evaluation of injuries following a fall.  She was getting up to get a shower and her bathrobe fell off her walker and she tripped over the bathrobe and fell to the ground, striking her head.  This happened about two hours prior to exam.  She denies any LOC, nausea, vomiting, neck pain, chest pain, abdominal pain, neck pain.  She has a headache where she struck her head.  She has chronic numbness on the left side from prior stroke.  Sxs are moderate and constant.    Past Medical History  Diagnosis Date  . Deviated nasal septum   . Dysfunction of eustachian tube   . Unspecified sinusitis (chronic)   . Allergic rhinitis, cause unspecified   . Arthritis   . Cataract   . Senile osteoporosis   . Hx of syncope     had loop recorder for 2 years now explanted, only PACs noted on device for over 2 years, no a fib, Echo 08/18/09 EF >55%mild concentric LVHnormal myoview, normal carotid dopplers 2010  . Pruritus of skin     poss. related to Losartan  . Hyperlipidemia   . Normal cardiac stress test     lexiscan myoview 04/08/08  . CVA (cerebral infarction) 2015  . Cerebral atrophy 2015  . Cerebrovascular disease 2015  . Dislocation of hip prosthesis 2015  . Lumbago 2015  . Unspecified hypothyroidism 2015  . Anxiety 2015  . Hyponatremia 2015  . Cervical spine degeneration 2015   Past Surgical History  Procedure Laterality Date  . Tonsillectomy    . Total hip arthroplasty      bilateral  . Pelvic sling    . Joint replacement    . Loop recorder implant  07/2009    to eval syncope  . Loop recorder explant  01/12/2012  . Loop recorder explant  N/A 01/12/2012    Procedure: LOOP RECORDER EXPLANT;  Surgeon: Thurmon Fair, MD;  Location: [facility] CATH [facility];  Service: Cardiovascular;  Laterality: N/A;   Family History  Problem Relation Age of Onset  . Cancer Father    History  Substance Use Topics  . Smoking status: Never Smoker   . Smokeless tobacco: Never Used  . Alcohol Use: No   OB History    No data available     Review of Systems  All other systems reviewed and are negative.     Allergies  Augmentin  Home Medications   Prior to Admission medications   Medication Sig Start Date End Date Taking? Authorizing Provider  apixaban (ELIQUIS) 5 MG TABS tablet Take 5 mg by mouth 2 (two) times daily.   Yes Historical Provider, MD  atorvastatin (LIPITOR) 10 MG tablet Take 10 mg by mouth daily at 6 PM.    Yes Historical Provider, MD  cetirizine (ZYRTEC) 10 MG tablet Take 10 mg by mouth daily as needed for allergies.    Yes Historical Provider, MD  guaiFENesin (MUCINEX) 600 MG 12 hr tablet Take 600 mg by mouth 2 (two) times daily as needed for cough.   Yes Historical Provider, MD  HYDROcodone-acetaminophen (NORCO/VICODIN) 5-325 MG per tablet Take one tablet by mouth three times daily at 8am,2pm and 8pm for chronic low back pain 12/19/14  Yes Tiffany L Reed, DO  levothyroxine (SYNTHROID, LEVOTHROID) 50 MCG tablet Take 50 mcg by mouth daily before breakfast.   Yes Historical Provider, MD  lisinopril (PRINIVIL,ZESTRIL) 10 MG tablet Take 10 mg by mouth daily.   Yes Historical Provider, MD  LORazepam (ATIVAN) 0.5 MG tablet Take one tablet by mouth twice daily at 8am and 10pm for anxiety 12/19/14  Yes Tiffany L Reed, DO  mirabegron ER (MYRBETRIQ) 25 MG TB24 tablet Take 25 mg by mouth daily.   Yes Historical Provider, MD  montelukast (SINGULAIR) 10 MG tablet Take 10 mg by mouth at bedtime.   Yes Historical Provider, MD  Polyethyl Glycol-Propyl Glycol (SYSTANE) 0.4-0.3 % SOLN Place 1 drop into both eyes 2 (two) times daily as needed (for dry  eyes).    Yes Historical Provider, MD  polyethylene glycol (MIRALAX / GLYCOLAX) packet Take 17 g by mouth 2 (two) times daily.   Yes Historical Provider, MD  sertraline (ZOLOFT) 25 MG tablet Take 25 mg by mouth daily.  09/15/14  Yes Historical Provider, MD   BP 187/64 mmHg  Pulse 57  Temp(Src) 98.2 F (36.8 C) (Oral)  Resp 17  SpO2 97% Physical Exam  Constitutional: She is oriented to person, place, and time. She appears well-developed and well-nourished.  HENT:  Head: Normocephalic.  Large hematoma to right posterior scalp with overlying abrasion.    Eyes: Pupils are equal, round, and reactive to light.  Neck:  No cspine tenderness  Cardiovascular:  Bradycardic, SEM  Pulmonary/Chest: Effort normal and breath sounds normal. No respiratory distress.  Abdominal: Soft. There is no tenderness. There is no rebound and no guarding.  Musculoskeletal: She exhibits no edema or tenderness.  Neurological: She is alert and oriented to person, place, and time.  5/5 strength in all four extremities  Skin: Skin is warm and dry.  Psychiatric: She has a normal mood and affect. Her behavior is normal.  Nursing note and vitals reviewed.   ED Course  Procedures (including critical care time) Labs Review Labs Reviewed - No data to display  Imaging Review Ct Head Wo Contrast  12/20/2014   CLINICAL DATA:  Fall hitting back of head. Hematoma. Initial encounter.  EXAM: CT HEAD WITHOUT CONTRAST  CT CERVICAL SPINE WITHOUT CONTRAST  TECHNIQUE: Multidetector CT imaging of the head and cervical spine was performed following the standard protocol without intravenous contrast. Multiplanar CT image reconstructions of the cervical spine were also generated.  COMPARISON:  05/07/2014  FINDINGS: CT HEAD FINDINGS  Skull and Sinuses:Large right occipital parietal scalp hematoma. No calvarial fracture. Bilateral chronic mastoiditis with partial air cell opacification and sclerosis.  Orbits: No acute abnormality.  Brain:  No evidence of acute infarction, hemorrhage, hydrocephalus, or mass lesion/mass effect.  Gliosis in the right occipital lobe related to PCA territory infarct in 2015. Stable pattern of small-vessel ischemic injury with patchy gliosis in the deep white matter tracts and right thalamus. There is a dilated perivascular space in the lower right putamen.  CT CERVICAL SPINE FINDINGS  Negative for acute fracture or subluxation. Anterolisthesis at C6-7, C7-T1, and T1-T2 is stable from prior. Cervicothoracic anterolisthesis is the most notable at 4 mm. No prevertebral edema. No gross cervical canal hematoma. No significant osseous canal or foraminal stenosis. 4 cm partly visible nodule in the right thyroid gland, size stable since 2009 sonography.  IMPRESSION:  1. No evidence of acute intracranial or cervical spine injury. 2. Right posterior scalp hematoma without fracture. 3. Chronic findings are stable from prior and noted above.   Electronically Signed   By: Marnee Spring M.D.   On: 12/20/2014 00:43   Ct Cervical Spine Wo Contrast  12/20/2014   CLINICAL DATA:  Fall hitting back of head. Hematoma. Initial encounter.  EXAM: CT HEAD WITHOUT CONTRAST  CT CERVICAL SPINE WITHOUT CONTRAST  TECHNIQUE: Multidetector CT imaging of the head and cervical spine was performed following the standard protocol without intravenous contrast. Multiplanar CT image reconstructions of the cervical spine were also generated.  COMPARISON:  05/07/2014  FINDINGS: CT HEAD FINDINGS  Skull and Sinuses:Large right occipital parietal scalp hematoma. No calvarial fracture. Bilateral chronic mastoiditis with partial air cell opacification and sclerosis.  Orbits: No acute abnormality.  Brain: No evidence of acute infarction, hemorrhage, hydrocephalus, or mass lesion/mass effect.  Gliosis in the right occipital lobe related to PCA territory infarct in 2015. Stable pattern of small-vessel ischemic injury with patchy gliosis in the deep white matter tracts  and right thalamus. There is a dilated perivascular space in the lower right putamen.  CT CERVICAL SPINE FINDINGS  Negative for acute fracture or subluxation. Anterolisthesis at C6-7, C7-T1, and T1-T2 is stable from prior. Cervicothoracic anterolisthesis is the most notable at 4 mm. No prevertebral edema. No gross cervical canal hematoma. No significant osseous canal or foraminal stenosis. 4 cm partly visible nodule in the right thyroid gland, size stable since 2009 sonography.  IMPRESSION: 1. No evidence of acute intracranial or cervical spine injury. 2. Right posterior scalp hematoma without fracture. 3. Chronic findings are stable from prior and noted above.   Electronically Signed   By: Marnee Spring M.D.   On: 12/20/2014 00:43     EKG Interpretation None      MDM   Final diagnoses:  Fall, initial encounter  Scalp abrasion, initial encounter    Pt here for evaluation of injuries following a mechanical fall.  Pt is on Eliquis.  There is no evidence of acute intracranial injury.  Pt has no evidence of additional injuries on exam.  Bleeding is controlled in department.  Discussed wound care, follow up, return precautions.      Tilden Fossa, MD 12/20/14 [phone]

## 2014-12-20 MED ORDER — ACETAMINOPHEN 325 MG PO TABS
650.0000 mg | ORAL_TABLET | Freq: Once | ORAL | Status: AC
  Administered 2014-12-20: 650 mg via ORAL
  Filled 2014-12-20: qty 2

## 2014-12-20 NOTE — Discharge Instructions (Signed)
Keep the dressing on you scalp for the next 24 hours.  After that you can remove the dressing and wash your hair.  If you have bleeding apply gentle direct pressure.  Get rechecked if you are unable to control the bleeding.     Head Injury You have received a head injury. It does not appear serious at this time. Headaches and vomiting are common following head injury. It should be easy to awaken from sleeping. Sometimes it is necessary for you to stay in the emergency department for a while for observation. Sometimes admission to the hospital may be needed. After injuries such as yours, most problems occur within the first 24 hours, but side effects may occur up to 7-10 days after the injury. It is important for you to carefully monitor your condition and contact your health care provider or seek immediate medical care if there is a change in your condition. WHAT ARE THE TYPES OF HEAD INJURIES? Head injuries can be as minor as a bump. Some head injuries can be more severe. More severe head injuries include:  A jarring injury to the brain (concussion).  A bruise of the brain (contusion). This mean there is bleeding in the brain that can cause swelling.  A cracked skull (skull fracture).  Bleeding in the brain that collects, clots, and forms a bump (hematoma). WHAT CAUSES A HEAD INJURY? A serious head injury is most likely to happen to someone who is in a car wreck and is not wearing a seat belt. Other causes of major head injuries include bicycle or motorcycle accidents, sports injuries, and falls. HOW ARE HEAD INJURIES DIAGNOSED? A complete history of the event leading to the injury and your current symptoms will be helpful in diagnosing head injuries. Many times, pictures of the brain, such as CT or MRI are needed to see the extent of the injury. Often, an overnight hospital stay is necessary for observation.  WHEN SHOULD I SEEK IMMEDIATE MEDICAL CARE?  You should get help right away if:  You  have confusion or drowsiness.  You feel sick to your stomach (nauseous) or have continued, forceful vomiting.  You have dizziness or unsteadiness that is getting worse.  You have severe, continued headaches not relieved by medicine. Only take over-the-counter or prescription medicines for pain, fever, or discomfort as directed by your health care provider.  You do not have normal function of the arms or legs or are unable to walk.  You notice changes in the black spots in the center of the colored part of your eye (pupil).  You have a clear or bloody fluid coming from your nose or ears.  You have a loss of vision. During the next 24 hours after the injury, you must stay with someone who can watch you for the warning signs. This person should contact local emergency services (911 in the [place] if you have seizures, you become unconscious, or you are unable to wake up. HOW CAN I PREVENT A HEAD INJURY IN THE FUTURE? The most important factor for preventing major head injuries is avoiding motor vehicle accidents. To minimize the potential for damage to your head, it is crucial to wear seat belts while riding in motor vehicles. Wearing helmets while bike riding and playing collision sports (like football) is also helpful. Also, avoiding dangerous activities around the house will further help reduce your risk of head injury.  WHEN CAN I RETURN TO NORMAL ACTIVITIES AND ATHLETICS? You should be reevaluated by your health  care provider before returning to these activities. If you have any of the following symptoms, you should not return to activities or contact sports until 1 week after the symptoms have stopped:  Persistent headache.  Dizziness or vertigo.  Poor attention and concentration.  Confusion.  Memory problems.  Nausea or vomiting.  Fatigue or tire easily.  Irritability.  Intolerant of bright lights or loud noises.  Anxiety or depression.  Disturbed sleep. MAKE SURE YOU:     Understand these instructions.  Will watch your condition.  Will get help right away if you are not doing well or get worse. Document Released: 10/03/2005 Document Revised: 10/08/2013 Document Reviewed: 06/10/2013 [facility] Patient Information 2015 [place], [place]. This information is not intended to replace advice given to you by your health care provider. Make sure you discuss any questions you have with your health care provider.

## 2014-12-20 NOTE — ED Notes (Signed)
[facility] contacted to transport patient home. 

## 2014-12-20 NOTE — ED Notes (Signed)
Pt reports using walker at nursing home. Pt able to bear weight without any pain while ambulating in room with assistance.

## 2014-12-20 NOTE — ED Notes (Signed)
EDP at bedside examining wound to posterior head

## 2014-12-22 ENCOUNTER — Non-Acute Institutional Stay (SKILLED_NURSING_FACILITY): Payer: Medicare Other | Admitting: Nurse Practitioner

## 2014-12-22 ENCOUNTER — Encounter: Payer: Self-pay | Admitting: Nurse Practitioner

## 2014-12-22 DIAGNOSIS — K5900 Constipation, unspecified: Secondary | ICD-10-CM | POA: Diagnosis not present

## 2014-12-22 DIAGNOSIS — I82409 Acute embolism and thrombosis of unspecified deep veins of unspecified lower extremity: Secondary | ICD-10-CM

## 2014-12-22 DIAGNOSIS — S0003XA Contusion of scalp, initial encounter: Secondary | ICD-10-CM | POA: Insufficient documentation

## 2014-12-22 DIAGNOSIS — I10 Essential (primary) hypertension: Secondary | ICD-10-CM

## 2014-12-22 DIAGNOSIS — G8929 Other chronic pain: Secondary | ICD-10-CM

## 2014-12-22 DIAGNOSIS — F418 Other specified anxiety disorders: Secondary | ICD-10-CM | POA: Diagnosis not present

## 2014-12-22 DIAGNOSIS — R3911 Hesitancy of micturition: Secondary | ICD-10-CM | POA: Diagnosis not present

## 2014-12-22 DIAGNOSIS — E039 Hypothyroidism, unspecified: Secondary | ICD-10-CM

## 2014-12-22 DIAGNOSIS — S0003XD Contusion of scalp, subsequent encounter: Secondary | ICD-10-CM

## 2014-12-22 DIAGNOSIS — M545 Low back pain: Secondary | ICD-10-CM

## 2014-12-22 NOTE — Progress Notes (Signed)
Patient ID: [name], female   DOB: January 12, 1922, 81 y.o.   MRN: [id number]   Code Status: DNR  Allergies  Allergen Reactions  . Augmentin [Amoxicillin-Pot Clavulanate] Nausea Only    Chief Complaint  Patient presents with  . Medical Management of Chronic Issues  . Acute Visit    s/p ED eval for the right occipital hematoma sustained from fall    HPI: Patient is a 81 y.o. female seen in the SNF at [facility] today for evaluation of f/u ED evaluation 12/20/14 for the posterior right head hematoma sustained from falling in her bathroom and other chronic medical conditions.   CT head and cervical spine in ED revealed no acute injury.  Problem List Items Addressed This Visit    Urinary hesitancy    06/30/14 US bladder: no significant abnormality evident, but initially only partially distended urinary bladder limiting evaluation. Post void residual 29ml.  She stated she has seen Dr. Urology and wears Estrogen ring(to be changed q67m)-dc'd due to recent CVA and LLE DVT. Urinary hesitancy was resolved after treat with Nitrofurantoin  bid x 7 along with Florastor bid.  08/28/14 c/o worsened urinary frequency-identified UTI-fully treated 09/24/14 c/o didn't think her UTI went away-getting up a lot at night-UA C/S and CBC in am 09/26/14 urine culture no growth 09/30/14 Urology: Leatrice Jewels vaginal Gel 3x/wk. Myrbetriq  daily.  --stabilized.       Traumatic hematoma of scalp - Primary    Right occipital about a gulf ball size-no s/s of infection. No focal neurological changes from prior. Denied HA, changed vision, or altered mentation.       Mixed anxiety depressive disorder    The patient admitted she is depressed and desires antidepressant-started Sertraline  daily 06/26/14. No longer c/o "foggy head" will eliminate possible AR of medications: hold Zytec x 1wk-will change Zyrtec to prn.  --stable, takes Ativan 0.5mg  bid, Sertraline  qhs.         Hypothyroidism      05/20/14 TSH 0.245 07/01/14 TSH 2.366 Levothyroxine 60mcg-update TSH       HTN (hypertension)    09/16/14 Neurology: Lisinopril  daily  09/24/14 controlled. Also takes Atorvastatin for cardiovascular risk reduction.  12/18/14 update CMP        Constipation    Controlled, takes MiraLax 17gm +4 Oz water po qd 12/18/14       Chronic low back pain    Not new. Pain is managed with Hydrocodone/ApAp 5/325mg  tid and q6h prn.         Acute DVT (deep venous thrombosis)    Left leg swelling, aches, feels heavy 06/06/14. 06/06/14 Duplex Venous US LLE: deep venous thrombosis at left common femoral and mid to proximal superficial femoral veins. Continue Eliqui  bid  07/01/14 Urology removed Estrogen ring 08/27/14 less edematous LLE --trace edema LLE>RLL since onset of the DVT.            Review of Systems:  Review of Systems  Constitutional: Negative for fever, chills, diaphoresis, activity change and appetite change.  HENT: Positive for hearing loss. Negative for congestion, ear discharge, ear pain, nosebleeds, sore throat and tinnitus.   Eyes: Negative for photophobia, pain, discharge, redness and itching.  Respiratory: Negative for cough, shortness of breath, wheezing and stridor.   Cardiovascular: Positive for leg swelling. Negative for chest pain and palpitations.       Trace only.   Gastrointestinal: Negative for nausea, vomiting, abdominal pain, diarrhea, constipation and blood in stool.  Endocrine: Negative for polydipsia.  Genitourinary: Positive for frequency. Negative for dysuria, urgency, hematuria and flank pain.  Musculoskeletal: Negative for myalgias, back pain and neck pain.  Skin: Negative for color change, pallor and rash.       Right posterior right occipital hematoma 5x5cm  Allergic/Immunologic: Negative for environmental allergies.  Neurological: Negative for dizziness, tremors, seizures, weakness and headaches.  Hematological: Bruises/bleeds easily.   Psychiatric/Behavioral: Negative for suicidal ideas and hallucinations. The patient is nervous/anxious.      Past Medical History  Diagnosis Date  . Deviated nasal septum   . Dysfunction of eustachian tube   . Unspecified sinusitis (chronic)   . Allergic rhinitis, cause unspecified   . Arthritis   . Cataract   . Senile osteoporosis   . Hx of syncope     had loop recorder for 2 years now explanted, only PACs noted on device for over 2 years, no a fib, Echo 08/18/09 EF >55%mild concentric LVHnormal myoview, normal carotid dopplers 2010  . Pruritus of skin     poss. related to Losartan  . Hyperlipidemia   . Normal cardiac stress test     lexiscan myoview 04/08/08  . CVA (cerebral infarction) 2015  . Cerebral atrophy 2015  . Cerebrovascular disease 2015  . Dislocation of hip prosthesis 2015  . Lumbago 2015  . Unspecified hypothyroidism 2015  . Anxiety 2015  . Hyponatremia 2015  . Cervical spine degeneration 2015   Past Surgical History  Procedure Laterality Date  . Tonsillectomy    . Total hip arthroplasty      bilateral  . Pelvic sling    . Joint replacement    . Loop recorder implant  07/2009    to eval syncope  . Loop recorder explant  01/12/2012  . Loop recorder explant N/A 01/12/2012    Procedure: LOOP RECORDER EXPLANT;  Surgeon: Thurmon Fair, MD;  Location: [facility] CATH LAB;  Service: Cardiovascular;  Laterality: N/A;   Social History:   reports that she has never smoked. She has never used smokeless tobacco. She reports that she does not drink alcohol or use illicit drugs.  Family History  Problem Relation Age of Onset  . Cancer Father     Medications: Patient's Medications  New Prescriptions   No medications on file  Previous Medications   APIXABAN (ELIQUIS) 5 MG TABS TABLET    Take 5 mg by mouth 2 (two) times daily.   ATORVASTATIN (LIPITOR) 10 MG TABLET    Take 10 mg by mouth daily at 6 PM.    CETIRIZINE (ZYRTEC) 10 MG TABLET    Take 10 mg by mouth daily as  needed for allergies.    GUAIFENESIN (MUCINEX) 600 MG 12 HR TABLET    Take 600 mg by mouth 2 (two) times daily as needed for cough.   HYDROCODONE-ACETAMINOPHEN (NORCO/VICODIN) 5-325 MG PER TABLET    Take one tablet by mouth three times daily at 8am,2pm and 8pm for chronic low back pain   LEVOTHYROXINE (SYNTHROID, LEVOTHROID) 50 MCG TABLET    Take 50 mcg by mouth daily before breakfast.   LISINOPRIL (PRINIVIL,ZESTRIL) 10 MG TABLET    Take 10 mg by mouth daily.   LORAZEPAM (ATIVAN) 0.5 MG TABLET    Take one tablet by mouth twice daily at 8am and 10pm for anxiety   MIRABEGRON ER (MYRBETRIQ) 25 MG TB24 TABLET    Take 25 mg by mouth daily.   MONTELUKAST (SINGULAIR) 10 MG TABLET    Take 10 mg  by mouth at bedtime.   POLYETHYL GLYCOL-PROPYL GLYCOL (SYSTANE) 0.4-0.3 % SOLN    Place 1 drop into both eyes 2 (two) times daily as needed (for dry eyes).    POLYETHYLENE GLYCOL (MIRALAX / GLYCOLAX) PACKET    Take 17 g by mouth 2 (two) times daily.   SERTRALINE (ZOLOFT) 25 MG TABLET    Take 25 mg by mouth daily.   Modified Medications   No medications on file  Discontinued Medications   No medications on file     Physical Exam: Physical Exam  Constitutional: She is oriented to person, place, and time. She appears well-developed and well-nourished. No distress.  HENT:  Head: Normocephalic and atraumatic.  Right Ear: External ear normal.  Left Ear: External ear normal.  Nose: Nose normal.  Mouth/Throat: Oropharynx is clear and moist. No oropharyngeal exudate.  Eyes: Conjunctivae and EOM are normal. Pupils are equal, round, and reactive to light. Right eye exhibits no discharge. Left eye exhibits no discharge. No scleral icterus.  Neck: Normal range of motion. Neck supple. No JVD present. No tracheal deviation present. No thyromegaly present.  Cardiovascular: Normal rate and regular rhythm.  Exam reveals no gallop and no friction rub.   Murmur heard. 4/6  Pulmonary/Chest: Effort normal and breath sounds  normal. No stridor. No respiratory distress. She has no wheezes. She has no rales. She exhibits no tenderness.  Abdominal: Soft. Bowel sounds are normal. She exhibits no distension and no mass. There is no tenderness. There is no rebound and no guarding.  Musculoskeletal: Normal range of motion. She exhibits edema. She exhibits no tenderness.  Chronic back pain. LLE swelling 1+ and aches   Lymphadenopathy:    She has no cervical adenopathy.  Neurological: She is alert and oriented to person, place, and time. She has normal reflexes. No cranial nerve deficit. She exhibits normal muscle tone. Coordination abnormal.  Slight weakness LUE and LLE  Skin: Skin is warm and dry. No rash noted. She is not diaphoretic. No erythema. No pallor.  Right posterior right occipital hematoma 5x5cm   Psychiatric: She has a normal mood and affect. Her behavior is normal. Judgment and thought content normal.    Filed Vitals:   12/22/14 1410  BP: 150/70  Pulse: 70  Temp: 97.7 F (36.5 C)  TempSrc: Tympanic  Resp: 20      Labs reviewed: Basic Metabolic Panel:  Recent Labs  14/78/2907/22/15 1720 05/07/14 2046 05/08/14 1440 05/09/14 0443  05/20/14 06/26/14 07/01/14 09/25/14  NA 136* 134*  --  138  < > 139 130*  --  140  K 4.4 4.2  --  4.0  < > 4.2 3.9  --  4.4  CL 97 102  --  102  --   --   --   --   --   CO2 23  --   --  23  --   --   --   --   --   GLUCOSE 98 107*  --  100*  --   --   --   --   --   BUN 13 14  --  12  < > 12 10  --  13  CREATININE 0.65 0.60  --  0.68  < > 0.7 0.6  --  0.8  CALCIUM 9.2  --   --  8.6  --   --   --   --   --   TSH  --   --  1.590  --   --  0.24*  --  2.34  --   < > = values in this interval not displayed. Liver Function Tests:  Recent Labs  09/25/14  AST 27  ALT 21  ALKPHOS 126*   No results for input(s): LIPASE, AMYLASE in the last 8760 hours. No results for input(s): AMMONIA in the last 8760 hours. CBC:  Recent Labs  05/06/14 0025  05/07/14 1720   05/09/14 0443 05/12/14 06/26/14 09/25/14  WBC 5.5  --  5.4  --  5.1 5.1 4.3 4.0  HGB 13.3  < > 14.4  < > 12.4 13.1 11.5* 11.2*  HCT 39.0  < > 43.4  < > 37.6 38 35* 35*  MCV 87.1  --  90.2  --  88.5  --   --   --   PLT 224  --  256  --  220 260 268 163  < > = values in this interval not displayed. Lipid Panel:  Recent Labs  05/08/14 0540  CHOL 198  HDL 55  LDLCALC 126*  TRIG 86  CHOLHDL 3.6    Past Procedures:  12/19/14 CT head and cervical spine w/o contrast:  IMPRESSION: 1. No evidence of acute intracranial or cervical spine injury. 2. Right posterior scalp hematoma without fracture. 3. Chronic findings are stable from prior and noted above.  Assessment/Plan Traumatic hematoma of scalp Right occipital about a gulf ball size-no s/s of infection. No focal neurological changes from prior. Denied HA, changed vision, or altered mentation.    Constipation Controlled, takes MiraLax 17gm +4 Oz water po qd 12/18/14    Urinary hesitancy 06/30/14 US bladder: no significant abnormality evident, but initially only partially distended urinary bladder limiting evaluation. Post void residual 29ml.  She stated she has seen Dr. Urology and wears Estrogen ring(to be changed q6m)-dc'd due to recent CVA and LLE DVT. Urinary hesitancy was resolved after treat with Nitrofurantoin 100mg  bid x 7 along with Florastor bid.  08/28/14 c/o worsened urinary frequency-identified UTI-fully treated 09/24/14 c/o didn't think her UTI went away-getting up a lot at night-UA C/S and CBC in am 09/26/14 urine culture no growth 09/30/14 Urology: Leatrice Jewels vaginal Gel 3x/wk. Myrbetriq 25mg  daily.  --stabilized.    Mixed anxiety depressive disorder The patient admitted she is depressed and desires antidepressant-started Sertraline 25mg  daily 06/26/14. No longer c/o "foggy head" will eliminate possible AR of medications: hold Zytec x 1wk-will change Zyrtec to prn.  --stable, takes Ativan 0.5mg  bid, Sertraline 25mg  qhs.       Hypothyroidism 05/20/14 TSH 0.245 07/01/14 TSH 2.366 Levothyroxine 65mcg-update TSH    HTN (hypertension) 09/16/14 Neurology: Lisinopril 10mg  daily  09/24/14 controlled. Also takes Atorvastatin for cardiovascular risk reduction.  12/18/14 update CMP     CVA (cerebral infarction) 05/08/14 MRI brain:  IMPRESSION:  1. Acute, moderately large right PCA territory infarct. No  hemorrhage.  2. Fetal type origins of the PCAs with occlusion of the proximal P2  segment on the right.  3. Intracranial atherosclerosis with severe stenoses of the proximal  basilar artery and left P2 segment and mild to moderate stenoses of  the distal left vertebral artery, left P2 segment, and left MCA  Bifurcation.  Discontinue estrogen vaginal ring.   07/02/14 c/o "foggy head"-f/u Neurology  09/16/14 Neurology Neurontin 100mg  tid for left arm and leg numbness.      Chronic low back pain Not new. Pain is managed with Hydrocodone/ApAp 5/325mg  tid and q6h prn.      Acute DVT (deep venous thrombosis) Left leg  swelling, aches, feels heavy 06/06/14. 06/06/14 Duplex Venous US LLE: deep venous thrombosis at left common femoral and mid to proximal superficial femoral veins. Continue Eliqui 5mg  bid  07/01/14 Urology removed Estrogen ring 08/27/14 less edematous LLE --trace edema LLE>RLL since onset of the DVT.        Family/ Staff Communication: observe the patient  Goals of Care: SNF  Labs/tests ordered: none

## 2014-12-22 NOTE — Assessment & Plan Note (Signed)
Not new. Pain is managed with Hydrocodone/ApAp 5/325mg tid and q6h prn.    

## 2014-12-22 NOTE — Assessment & Plan Note (Signed)
06/30/14 US bladder: no significant abnormality evident, but initially only partially distended urinary bladder limiting evaluation. Post void residual 29ml.  She stated she has seen Dr. Urology and wears Estrogen ring(to be changed q5933m)-dc'd due to recent CVA and LLE DVT. Urinary hesitancy was resolved after treat with Nitrofurantoin 100mg  bid x 7 along with Florastor bid.  08/28/14 c/o worsened urinary frequency-identified UTI-fully treated 09/24/14 c/o didn't think her UTI went away-getting up a lot at night-UA C/S and CBC in am 09/26/14 urine culture no growth 09/30/14 Urology: Leatrice JewelsLuvena vaginal Gel 3x/wk. Myrbetriq 25mg  daily.  --stabilized.

## 2014-12-22 NOTE — Assessment & Plan Note (Signed)
05/08/14 MRI brain:  IMPRESSION:  1. Acute, moderately large right PCA territory infarct. No  hemorrhage.  2. Fetal type origins of the PCAs with occlusion of the proximal P2  segment on the right.  3. Intracranial atherosclerosis with severe stenoses of the proximal  basilar artery and left P2 segment and mild to moderate stenoses of  the distal left vertebral artery, left P2 segment, and left MCA  Bifurcation.  Discontinue estrogen vaginal ring.   07/02/14 c/o "foggy head"-f/u Neurology  09/16/14 Neurology Neurontin 100mg  tid for left arm and leg numbness.

## 2014-12-22 NOTE — Assessment & Plan Note (Signed)
The patient admitted she is depressed and desires antidepressant-started Sertraline 25mg daily 06/26/14. No longer c/o "foggy head" will eliminate possible AR of medications: hold Zytec x 1wk-will change Zyrtec to prn.  --stable, takes Ativan 0.5mg bid, Sertraline 25mg qhs.     

## 2014-12-22 NOTE — Assessment & Plan Note (Signed)
Controlled, takes MiraLax 17gm +4 Oz water po qd 12/18/14   

## 2014-12-22 NOTE — Assessment & Plan Note (Signed)
Left leg swelling, aches, feels heavy 06/06/14. 06/06/14 Duplex Venous US LLE: deep venous thrombosis at left common femoral and mid to proximal superficial femoral veins. Continue Eliqui 5mg bid  07/01/14 Urology removed Estrogen ring 08/27/14 less edematous LLE --trace edema LLE>RLL since onset of the DVT.   

## 2014-12-22 NOTE — Assessment & Plan Note (Signed)
09/16/14 Neurology: Lisinopril 10mg daily  09/24/14 controlled. Also takes Atorvastatin for cardiovascular risk reduction.  12/18/14 update CMP    

## 2014-12-22 NOTE — Assessment & Plan Note (Signed)
05/20/14 TSH 0.245 07/01/14 TSH 2.366 Levothyroxine 50mcg-update TSH 

## 2014-12-22 NOTE — Assessment & Plan Note (Signed)
Right occipital about a gulf ball size-no s/s of infection. No focal neurological changes from prior. Denied HA, changed vision, or altered mentation.

## 2014-12-23 LAB — TSH: TSH: 2.15 u[IU]/mL (ref 0.41–5.90)

## 2014-12-23 LAB — BASIC METABOLIC PANEL
BUN: 18 mg/dL (ref 4–21)
Creatinine: 0.7 mg/dL (ref 0.5–1.1)
GLUCOSE: 90 mg/dL
Potassium: 4.3 mmol/L (ref 3.4–5.3)
Sodium: 139 mmol/L (ref 137–147)

## 2014-12-23 LAB — CBC AND DIFFERENTIAL
HCT: 34 % — AB (ref 36–46)
Hemoglobin: 11.2 g/dL — AB (ref 12.0–16.0)
PLATELETS: 179 10*3/uL (ref 150–399)
WBC: 4.8 10*3/mL

## 2014-12-23 LAB — HEPATIC FUNCTION PANEL
ALT: 8 U/L (ref 7–35)
AST: 14 U/L (ref 13–35)
Alkaline Phosphatase: 71 U/L (ref 25–125)
Bilirubin, Total: 0.5 mg/dL

## 2014-12-24 ENCOUNTER — Other Ambulatory Visit: Payer: Self-pay | Admitting: Nurse Practitioner

## 2014-12-24 DIAGNOSIS — E871 Hypo-osmolality and hyponatremia: Secondary | ICD-10-CM

## 2014-12-24 DIAGNOSIS — E039 Hypothyroidism, unspecified: Secondary | ICD-10-CM

## 2015-01-12 ENCOUNTER — Encounter: Payer: Self-pay | Admitting: Nurse Practitioner

## 2015-01-12 DIAGNOSIS — W19XXXA Unspecified fall, initial encounter: Secondary | ICD-10-CM | POA: Insufficient documentation

## 2015-01-28 ENCOUNTER — Encounter: Payer: Self-pay | Admitting: Nurse Practitioner

## 2015-01-28 ENCOUNTER — Non-Acute Institutional Stay (SKILLED_NURSING_FACILITY): Payer: Medicare Other | Admitting: Nurse Practitioner

## 2015-01-28 DIAGNOSIS — K5900 Constipation, unspecified: Secondary | ICD-10-CM | POA: Diagnosis not present

## 2015-01-28 DIAGNOSIS — E039 Hypothyroidism, unspecified: Secondary | ICD-10-CM

## 2015-01-28 DIAGNOSIS — F419 Anxiety disorder, unspecified: Secondary | ICD-10-CM

## 2015-01-28 DIAGNOSIS — M545 Low back pain: Secondary | ICD-10-CM

## 2015-01-28 DIAGNOSIS — R3911 Hesitancy of micturition: Secondary | ICD-10-CM

## 2015-01-28 DIAGNOSIS — J310 Chronic rhinitis: Secondary | ICD-10-CM | POA: Diagnosis not present

## 2015-01-28 DIAGNOSIS — G8929 Other chronic pain: Secondary | ICD-10-CM | POA: Diagnosis not present

## 2015-01-28 DIAGNOSIS — I10 Essential (primary) hypertension: Secondary | ICD-10-CM

## 2015-01-28 DIAGNOSIS — H6980 Other specified disorders of Eustachian tube, unspecified ear: Secondary | ICD-10-CM

## 2015-01-28 DIAGNOSIS — I639 Cerebral infarction, unspecified: Secondary | ICD-10-CM

## 2015-01-28 DIAGNOSIS — I82402 Acute embolism and thrombosis of unspecified deep veins of left lower extremity: Secondary | ICD-10-CM

## 2015-01-28 NOTE — Assessment & Plan Note (Signed)
12/03/14 dc prn Antivert due to lack of use.  

## 2015-01-28 NOTE — Assessment & Plan Note (Signed)
Left leg swelling, aches, feels heavy 06/06/14. 06/06/14 Duplex Venous US LLE: deep venous thrombosis at left common femoral and mid to proximal superficial femoral veins. Continue Eliqui 5mg bid  07/01/14 Urology removed Estrogen ring 08/27/14 less edematous LLE --trace edema LLE>RLL since onset of the DVT.   

## 2015-01-28 NOTE — Assessment & Plan Note (Signed)
Controlled, takes MiraLax 17gm +4 Oz water po qd 12/18/14   

## 2015-01-28 NOTE — Assessment & Plan Note (Signed)
Stable, takes Mucinex bid prn, Singulair 10md daily, Zyrtec 10mg  daily prn

## 2015-01-28 NOTE — Progress Notes (Signed)
This encounter was created in error - please disregard.

## 2015-01-28 NOTE — Assessment & Plan Note (Signed)
Not new. Pain is managed with Hydrocodone/ApAp 5/325mg tid and q6h prn.    

## 2015-01-28 NOTE — Assessment & Plan Note (Signed)
05/20/14 TSH 0.245 07/01/14 TSH 2.366 12/23/14 TSH 2.153 Continue Levothyroxine 50mcg   

## 2015-01-28 NOTE — Progress Notes (Signed)
Patient ID: [name], female   DOB: May 17, 1922, 81 y.o.   MRN: [id number]   Code Status: DNR  Allergies  Allergen Reactions  . Augmentin [Amoxicillin-Pot Clavulanate] Nausea Only    Chief Complaint  Patient presents with  . Medical Management of Chronic Issues    HPI: Patient is a 81 y.o. female seen in the SNF at [facility] today for evaluation of chronic medical conditions.   CT head and cervical spine in ED revealed no acute injury. Posterior occipital hematoma is opened and healing slowly.  Problem List Items Addressed This Visit    Urinary hesitancy    06/30/14 US bladder: no significant abnormality evident, but initially only partially distended urinary bladder limiting evaluation. Post void residual 29ml.  She stated she has seen Dr. Urology and wears Estrogen ring(to be changed q40m)-dc'd due to recent CVA and LLE DVT. Urinary hesitancy was resolved after treat with Nitrofurantoin 100mg  bid x 7 along with Florastor bid.  08/28/14 c/o worsened urinary frequency-identified UTI-fully treated 09/24/14 c/o didn't think her UTI went away-getting up a lot at night-UA C/S and CBC in am 09/26/14 urine culture no growth 09/30/14 Urology: Leatrice Jewels vaginal Gel 3x/wk. Myrbetriq 25mg  daily.  02/02/15 relapsed urinary frequency and burning sensation when urinating-Cath UA C/S to r/o UTI       Nonallergic rhinitis    Stable, takes Mucinex bid prn, Singulair 10md daily, Zyrtec 10mg  daily prn      Hypothyroidism    05/20/14 TSH 0.245 07/01/14 TSH 2.366 12/23/14 TSH 2.153 Continue Levothyroxine        HTN (hypertension)    09/16/14 Neurology: Lisinopril 10mg  daily  -- controlled. Also takes Atorvastatin for cardiovascular risk reduction.           EUSTACHIAN TUBE DYSFUNCTION    12/03/14 dc prn Antivert due to lack of use.        CVA (cerebral infarction)    05/08/14 MRI brain:  IMPRESSION:  1. Acute, moderately large right PCA territory infarct. No    hemorrhage.  2. Fetal type origins of the PCAs with occlusion of the proximal P2  segment on the right.  3. Intracranial atherosclerosis with severe stenoses of the proximal  basilar artery and left P2 segment and mild to moderate stenoses of  the distal left vertebral artery, left P2 segment, and left MCA  Bifurcation.  Discontinue estrogen vaginal ring.   07/02/14 c/o "foggy head"-f/u Neurology  09/16/14 Neurology Neurontin 100mg  tid for left arm and leg numbness.   Continue Eliquis 5mg  bid       Constipation - Primary    Controlled, takes MiraLax 17gm +4 Oz water po qd 12/18/14        Chronic low back pain    Not new. Pain is managed with Hydrocodone/ApAp 5/325mg  tid and q6h prn.        Anxiety    The patient admitted she is depressed and desires antidepressant-started Sertraline 25mg  daily 06/26/14. No longer c/o "foggy head" will eliminate possible AR of medications: hold Zytec x 1wk-will change Zyrtec to prn.  --stable, takes Ativan 0.5mg  bid, Sertraline 25mg  qd      Acute DVT (deep venous thrombosis)    Left leg swelling, aches, feels heavy 06/06/14. 06/06/14 Duplex Venous US LLE: deep venous thrombosis at left common femoral and mid to proximal superficial femoral veins. Continue Eliqui 5mg  bid  07/01/14 Urology removed Estrogen ring 08/27/14 less edematous LLE --trace edema LLE>RLL since onset of the DVT.  Review of Systems:  Review of Systems  Constitutional: Negative for fever, chills, diaphoresis, activity change and appetite change.  HENT: Positive for hearing loss. Negative for congestion, ear discharge, ear pain, nosebleeds, sore throat and tinnitus.   Eyes: Negative for photophobia, pain, discharge, redness and itching.  Respiratory: Negative for cough, shortness of breath, wheezing and stridor.   Cardiovascular: Positive for leg swelling. Negative for chest pain and palpitations.       Trace only.   Gastrointestinal: Negative for nausea,  vomiting, abdominal pain, diarrhea, constipation and blood in stool.  Endocrine: Negative for polydipsia.  Genitourinary: Positive for frequency. Negative for dysuria, urgency, hematuria and flank pain.  Musculoskeletal: Negative for myalgias, back pain and neck pain.  Skin: Negative for color change, pallor and rash.       Right posterior right occipital hematoma opened and healing slowly.   Allergic/Immunologic: Negative for environmental allergies.  Neurological: Negative for dizziness, tremors, seizures, weakness and headaches.  Hematological: Bruises/bleeds easily.  Psychiatric/Behavioral: Negative for suicidal ideas and hallucinations. The patient is nervous/anxious.      Past Medical History  Diagnosis Date  . Deviated nasal septum   . Dysfunction of eustachian tube   . Unspecified sinusitis (chronic)   . Allergic rhinitis, cause unspecified   . Arthritis   . Cataract   . Senile osteoporosis   . Hx of syncope     had loop recorder for 2 years now explanted, only PACs noted on device for over 2 years, no a fib, Echo 08/18/09 EF >55%mild concentric LVHnormal myoview, normal carotid dopplers 2010  . Pruritus of skin     poss. related to Losartan  . Hyperlipidemia   . Normal cardiac stress test     lexiscan myoview 04/08/08  . CVA (cerebral infarction) 2015  . Cerebral atrophy 2015  . Cerebrovascular disease 2015  . Dislocation of hip prosthesis 2015  . Lumbago 2015  . Unspecified hypothyroidism 2015  . Anxiety 2015  . Hyponatremia 2015  . Cervical spine degeneration 2015   Past Surgical History  Procedure Laterality Date  . Tonsillectomy    . Total hip arthroplasty      bilateral  . Pelvic sling    . Joint replacement    . Loop recorder implant  07/2009    to eval syncope  . Tibial tuberclerplasty  01/12/2012  . Loop recorder explant N/A 01/12/2012    Procedure: LOOP RECORDER EXPLANT;  Surgeon: Thurmon Fair, MD;  Location: [facility] CATH LAB;  Service: Cardiovascular;   Laterality: N/A;   Social History:   reports that she has never smoked. She has never used smokeless tobacco. She reports that she does not drink alcohol or use illicit drugs.  Family History  Problem Relation Age of Onset  . Cancer Father     Medications: Patient's Medications  New Prescriptions   No medications on file  Previous Medications   APIXABAN (ELIQUIS) 5 MG TABS TABLET    Take 5 mg by mouth 2 (two) times daily.   ATORVASTATIN (LIPITOR) 10 MG TABLET    Take 10 mg by mouth daily at 6 PM.    CETIRIZINE (ZYRTEC) 10 MG TABLET    Take 10 mg by mouth daily as needed for allergies.    GUAIFENESIN (MUCINEX) 600 MG 12 HR TABLET    Take 600 mg by mouth 2 (two) times daily as needed for cough.   HYDROCODONE-ACETAMINOPHEN (NORCO/VICODIN) 5-325 MG PER TABLET    Take one tablet by mouth three times  daily at 8am,2pm and 8pm for chronic low back pain   LEVOTHYROXINE (SYNTHROID, LEVOTHROID) 50 MCG TABLET    Take 50 mcg by mouth daily before breakfast.   LISINOPRIL (PRINIVIL,ZESTRIL) 10 MG TABLET    Take 10 mg by mouth daily.   LORAZEPAM (ATIVAN) 0.5 MG TABLET    Take one tablet by mouth twice daily at 8am and 10pm for anxiety   MIRABEGRON ER (MYRBETRIQ) 25 MG TB24 TABLET    Take 25 mg by mouth daily.   MONTELUKAST (SINGULAIR) 10 MG TABLET    Take 10 mg by mouth at bedtime.   POLYETHYL GLYCOL-PROPYL GLYCOL (SYSTANE) 0.4-0.3 % SOLN    Place 1 drop into both eyes 2 (two) times daily as needed (for dry eyes).    POLYETHYLENE GLYCOL (MIRALAX / GLYCOLAX) PACKET    Take 17 g by mouth 2 (two) times daily.   SERTRALINE (ZOLOFT) 25 MG TABLET    Take 25 mg by mouth daily.   Modified Medications   No medications on file  Discontinued Medications   No medications on file     Physical Exam: Physical Exam  Constitutional: She is oriented to person, place, and time. She appears well-developed and well-nourished. No distress.  HENT:  Head: Normocephalic and atraumatic.  Right Ear: External ear  normal.  Left Ear: External ear normal.  Nose: Nose normal.  Mouth/Throat: Oropharynx is clear and moist. No oropharyngeal exudate.  Eyes: Conjunctivae and EOM are normal. Pupils are equal, round, and reactive to light. Right eye exhibits no discharge. Left eye exhibits no discharge. No scleral icterus.  Neck: Normal range of motion. Neck supple. No JVD present. No tracheal deviation present. No thyromegaly present.  Cardiovascular: Normal rate and regular rhythm.  Exam reveals no gallop and no friction rub.   Murmur heard. 4/6  Pulmonary/Chest: Effort normal and breath sounds normal. No stridor. No respiratory distress. She has no wheezes. She has no rales. She exhibits no tenderness.  Abdominal: Soft. Bowel sounds are normal. She exhibits no distension and no mass. There is no tenderness. There is no rebound and no guarding.  Musculoskeletal: Normal range of motion. She exhibits edema. She exhibits no tenderness.  Chronic back pain. LLE swelling 1+ and aches   Lymphadenopathy:    She has no cervical adenopathy.  Neurological: She is alert and oriented to person, place, and time. She has normal reflexes. No cranial nerve deficit. She exhibits normal muscle tone. Coordination abnormal.  Slight weakness LUE and LLE  Skin: Skin is warm and dry. No rash noted. She is not diaphoretic. No erythema. No pallor.  Right posterior right occipital hematoma opened and healing slowly.    Psychiatric: She has a normal mood and affect. Her behavior is normal. Judgment and thought content normal.    Filed Vitals:   01/28/15 1359  BP: 116/60  Pulse: 54  Temp: 98.2 F (36.8 C)  TempSrc: Tympanic  Resp: 14      Labs reviewed: Basic Metabolic Panel:  Recent Labs  16/07/9606/22/15 1720 05/07/14 2046  05/09/14 0443  05/20/14 06/26/14 07/01/14 09/25/14 12/23/14  NA 136* 134*  --  138  < > 139 130*  --  140 139  K 4.4 4.2  --  4.0  < > 4.2 3.9  --  4.4 4.3  CL 97 102  --  102  --   --   --   --   --    --   CO2 23  --   --  23  --   --   --   --   --   --   GLUCOSE 98 107*  --  100*  --   --   --   --   --   --   BUN 13 14  --  12  < > 12 10  --  13 18  CREATININE 0.65 0.60  --  0.68  < > 0.7 0.6  --  0.8 0.7  CALCIUM 9.2  --   --  8.6  --   --   --   --   --   --   TSH  --   --   < >  --   --  0.24*  --  2.34  --  2.15  < > = values in this interval not displayed. Liver Function Tests:  Recent Labs  09/25/14 12/23/14  AST 27 14  ALT 21 8  ALKPHOS 126* 71   No results for input(s): LIPASE, AMYLASE in the last 8760 hours. No results for input(s): AMMONIA in the last 8760 hours. CBC:  Recent Labs  05/06/14 0025  05/07/14 1720  05/09/14 0443  06/26/14 09/25/14 12/23/14  WBC 5.5  --  5.4  --  5.1  < > 4.3 4.0 4.8  HGB 13.3  < > 14.4  < > 12.4  < > 11.5* 11.2* 11.2*  HCT 39.0  < > 43.4  < > 37.6  < > 35* 35* 34*  MCV 87.1  --  90.2  --  88.5  --   --   --   --   PLT 224  --  256  --  220  < > 268 163 179  < > = values in this interval not displayed. Lipid Panel:  Recent Labs  05/08/14 0540  CHOL 198  HDL 55  LDLCALC 126*  TRIG 86  CHOLHDL 3.6    Past Procedures:  12/19/14 CT head and cervical spine w/o contrast:  IMPRESSION: 1. No evidence of acute intracranial or cervical spine injury. 2. Right posterior scalp hematoma without fracture. 3. Chronic findings are stable from prior and noted above.  Assessment/Plan Constipation Controlled, takes MiraLax 17gm +4 Oz water po qd 12/18/14     Hypothyroidism 05/20/14 TSH 0.245 07/01/14 TSH 2.366 12/23/14 TSH 2.153 Continue Levothyroxine     HTN (hypertension) 09/16/14 Neurology: Lisinopril 10mg  daily  -- controlled. Also takes Atorvastatin for cardiovascular risk reduction.        Urinary hesitancy 06/30/14 US bladder: no significant abnormality evident, but initially only partially distended urinary bladder limiting evaluation. Post void residual 29ml.  She stated she has seen Dr. Urology and wears  Estrogen ring(to be changed q50m)-dc'd due to recent CVA and LLE DVT. Urinary hesitancy was resolved after treat with Nitrofurantoin 100mg  bid x 7 along with Florastor bid.  08/28/14 c/o worsened urinary frequency-identified UTI-fully treated 09/24/14 c/o didn't think her UTI went away-getting up a lot at night-UA C/S and CBC in am 09/26/14 urine culture no growth 09/30/14 Urology: Leatrice Jewels vaginal Gel 3x/wk. Myrbetriq 25mg  daily.  02/02/15 relapsed urinary frequency and burning sensation when urinating-Cath UA C/S to r/o UTI    Anxiety The patient admitted she is depressed and desires antidepressant-started Sertraline 25mg  daily 06/26/14. No longer c/o "foggy head" will eliminate possible AR of medications: hold Zytec x 1wk-will change Zyrtec to prn.  --stable, takes Ativan 0.5mg  bid, Sertraline 25mg  qd   CVA (cerebral infarction) 05/08/14 MRI brain:  IMPRESSION:  1. Acute, moderately large right PCA territory infarct. No  hemorrhage.  2. Fetal type origins of the PCAs with occlusion of the proximal P2  segment on the right.  3. Intracranial atherosclerosis with severe stenoses of the proximal  basilar artery and left P2 segment and mild to moderate stenoses of  the distal left vertebral artery, left P2 segment, and left MCA  Bifurcation.  Discontinue estrogen vaginal ring.   07/02/14 c/o "foggy head"-f/u Neurology  09/16/14 Neurology Neurontin  tid for left arm and leg numbness.   Continue Eliquis  bid    Chronic low back pain Not new. Pain is managed with Hydrocodone/ApAp 5/325mg  tid and q6h prn.     EUSTACHIAN TUBE DYSFUNCTION 12/03/14 dc prn Antivert due to lack of use.     Nonallergic rhinitis Stable, takes Mucinex bid prn, Singulair 10md daily, Zyrtec  daily prn   Acute DVT (deep venous thrombosis) Left leg swelling, aches, feels heavy 06/06/14. 06/06/14 Duplex Venous US LLE: deep venous thrombosis at left common femoral and mid to proximal superficial  femoral veins. Continue Eliqui  bid  07/01/14 Urology removed Estrogen ring 08/27/14 less edematous LLE --trace edema LLE>RLL since onset of the DVT.       Family/ Staff Communication: observe the patient  Goals of Care: SNF  Labs/tests ordered: Cath UA C/S in am

## 2015-01-28 NOTE — Assessment & Plan Note (Signed)
05/08/14 MRI brain:  IMPRESSION:  1. Acute, moderately large right PCA territory infarct. No  hemorrhage.  2. Fetal type origins of the PCAs with occlusion of the proximal P2  segment on the right.  3. Intracranial atherosclerosis with severe stenoses of the proximal  basilar artery and left P2 segment and mild to moderate stenoses of  the distal left vertebral artery, left P2 segment, and left MCA  Bifurcation.  Discontinue estrogen vaginal ring.   07/02/14 c/o "foggy head"-f/u Neurology  09/16/14 Neurology Neurontin 100mg tid for left arm and leg numbness.   Continue Eliquis 5mg bid   

## 2015-01-28 NOTE — Assessment & Plan Note (Signed)
The patient admitted she is depressed and desires antidepressant-started Sertraline 25mg daily 06/26/14. No longer c/o "foggy head" will eliminate possible AR of medications: hold Zytec x 1wk-will change Zyrtec to prn.  --stable, takes Ativan 0.5mg bid, Sertraline 25mg qd  

## 2015-01-28 NOTE — Assessment & Plan Note (Addendum)
06/30/14 US bladder: no significant abnormality evident, but initially only partially distended urinary bladder limiting evaluation. Post void residual 29ml.  She stated she has seen Dr. Urology and wears Estrogen ring(to be changed q1364m)-dc'd due to recent CVA and LLE DVT. Urinary hesitancy was resolved after treat with Nitrofurantoin 100mg  bid x 7 along with Florastor bid.  08/28/14 c/o worsened urinary frequency-identified UTI-fully treated 09/24/14 c/o didn't think her UTI went away-getting up a lot at night-UA C/S and CBC in am 09/26/14 urine culture no growth 09/30/14 Urology: Leatrice JewelsLuvena vaginal Gel 3x/wk. Myrbetriq 25mg  daily.  02/02/15 relapsed urinary frequency and burning sensation when urinating-Cath UA C/S to r/o UTI

## 2015-01-28 NOTE — Assessment & Plan Note (Signed)
09/16/14 Neurology: Lisinopril 10mg daily  -- controlled. Also takes Atorvastatin for cardiovascular risk reduction.     

## 2015-02-19 ENCOUNTER — Non-Acute Institutional Stay (SKILLED_NURSING_FACILITY): Payer: Medicare Other | Admitting: Internal Medicine

## 2015-02-19 ENCOUNTER — Encounter: Payer: Self-pay | Admitting: Internal Medicine

## 2015-02-19 DIAGNOSIS — E039 Hypothyroidism, unspecified: Secondary | ICD-10-CM | POA: Diagnosis not present

## 2015-02-19 DIAGNOSIS — I10 Essential (primary) hypertension: Secondary | ICD-10-CM | POA: Diagnosis not present

## 2015-02-19 DIAGNOSIS — I350 Nonrheumatic aortic (valve) stenosis: Secondary | ICD-10-CM

## 2015-02-19 DIAGNOSIS — E785 Hyperlipidemia, unspecified: Secondary | ICD-10-CM | POA: Diagnosis not present

## 2015-02-19 NOTE — Progress Notes (Signed)
Patient ID: [name] KinsMarjorie D [name], female   DOB: Feb 24, 1922, 81 y.o.   MRN: [id number]    [facility]  Place of Service: SNF (31) OFFICE    Allergies  Allergen Reactions  . Augmentin [Amoxicillin-Pot Clavulanate] Nausea Only    Chief Complaint  Patient presents with  . Medical Management of Chronic Issues    HPI:  Aortic stenosis: Fairly loud murmur. No angina. No syncopal episodes.  Essential hypertension: Controlled  Hypothyroidism, unspecified hypothyroidism type: TSH normal in March 2016  Dyslipidemia: No recent lab    Medications: Patient's Medications  New Prescriptions   No medications on file  Previous Medications   APIXABAN (ELIQUIS) 5 MG TABS TABLET    Take 5 mg by mouth 2 (two) times daily.   ATORVASTATIN (LIPITOR) 10 MG TABLET    Take 10 mg by mouth daily at 6 PM.    CETIRIZINE (ZYRTEC) 10 MG TABLET    Take 10 mg by mouth daily as needed for allergies.    GABAPENTIN (NEURONTIN) 100 MG CAPSULE       GUAIFENESIN (MUCINEX) 600 MG 12 HR TABLET    Take 600 mg by mouth 2 (two) times daily as needed for cough.   HYDROCODONE-ACETAMINOPHEN (NORCO/VICODIN) 5-325 MG PER TABLET    Take one tablet by mouth three times daily at 8am,2pm and 8pm for chronic low back pain   LEVOTHYROXINE (SYNTHROID, LEVOTHROID) 50 MCG TABLET    Take 50 mcg by mouth daily before breakfast.   LISINOPRIL (PRINIVIL,ZESTRIL) 10 MG TABLET    Take 10 mg by mouth daily.   LORAZEPAM (ATIVAN) 0.5 MG TABLET    Take one tablet by mouth twice daily at 8am and 10pm for anxiety   MECLIZINE (ANTIVERT) 25 MG TABLET       MIRABEGRON ER (MYRBETRIQ) 25 MG TB24 TABLET    Take 25 mg by mouth daily.   MONTELUKAST (SINGULAIR) 10 MG TABLET    Take 10 mg by mouth at bedtime.   POLYETHYL GLYCOL-PROPYL GLYCOL (SYSTANE) 0.4-0.3 % SOLN    Place 1 drop into both eyes 2 (two) times daily as needed (for dry eyes).    POLYETHYLENE GLYCOL (MIRALAX / GLYCOLAX) PACKET    Take 17 g  by mouth 2 (two) times daily.   POLYETHYLENE GLYCOL POWDER (GLYCOLAX/MIRALAX) POWDER       SERTRALINE (ZOLOFT) 25 MG TABLET    Take 25 mg by mouth daily.   Modified Medications   No medications on file  Discontinued Medications   No medications on file     Review of Systems  Constitutional: Positive for activity change and fatigue. Negative for fever, diaphoresis and unexpected weight change.  HENT: Positive for hearing loss. Negative for ear pain, postnasal drip, rhinorrhea, sinus pressure, trouble swallowing and voice change.   Eyes: Negative.   Respiratory: Negative for cough, choking, chest tightness and shortness of breath.   Cardiovascular: Positive for leg swelling. Negative for chest pain and palpitations.       Left leg swollen and tender from the foot to the upper thigh.  Gastrointestinal: Positive for constipation. Negative for nausea, abdominal pain, diarrhea, blood in stool, abdominal distention and rectal pain.  Endocrine:       Hypothyroid  Genitourinary: Negative for dysuria, frequency, flank pain and difficulty urinating.  Musculoskeletal: Positive for back pain, arthralgias, gait problem, neck pain and neck stiffness. Negative for myalgias and joint swelling.  Skin:       Slow  healing lesion anterior left shin sustained in a traumatic injury. No infection.  Allergic/Immunologic: Negative.   Neurological: Positive for weakness (Mild left-sided hemiparesis). Negative for dizziness, tremors, seizures, facial asymmetry, speech difficulty, light-headedness, numbness and headaches.  Hematological: Negative.   Psychiatric/Behavioral: Negative.     Filed Vitals:   02/19/15 1230  BP: 132/68  Pulse: 72  Temp: 98.2 F (36.8 C)  Resp: 22  Height: 5\' 4"  (1.626 m)  Weight: 143 lb 4.8 oz (65 kg)   Body mass index is 24.59 kg/(m^2).  Physical Exam  Constitutional: She is oriented to person, place, and time. She appears well-developed and well-nourished. No distress.    HENT:  Head: Normocephalic and atraumatic.  Right Ear: External ear normal.  Left Ear: External ear normal.  Nose: Nose normal.  Mouth/Throat: Oropharynx is clear and moist. No oropharyngeal exudate.  Eyes: Conjunctivae and EOM are normal. Pupils are equal, round, and reactive to light. Right eye exhibits no discharge. Left eye exhibits no discharge. No scleral icterus.  Neck: Normal range of motion. Neck supple. No JVD present. No tracheal deviation present. No thyromegaly present.  Cardiovascular: Normal rate and regular rhythm.  Exam reveals no gallop and no friction rub.   Murmur heard. 4/6  Pulmonary/Chest: Effort normal and breath sounds normal. No stridor. No respiratory distress. She has no wheezes. She has no rales. She exhibits no tenderness.  Abdominal: Soft. Bowel sounds are normal. She exhibits no distension and no mass. There is no tenderness. There is no rebound and no guarding.  Musculoskeletal: Normal range of motion. She exhibits edema. She exhibits no tenderness.  Chronic back pain. LLE swelling 1+ and aches  DVT left leg.  Lymphadenopathy:    She has no cervical adenopathy.  Neurological: She is alert and oriented to person, place, and time. She has normal reflexes. No cranial nerve deficit. She exhibits normal muscle tone. Coordination normal.  Slight weakness LUE and LLE  Skin: Skin is warm and dry. No rash noted. She is not diaphoretic. No erythema. No pallor.  Slow healing, slightly macerated lesion of the left anterior shin. No signs of infection.  Psychiatric: She has a normal mood and affect. Her behavior is normal. Judgment and thought content normal.     Labs reviewed: Lab on 12/24/2014  Component Date Value Ref Range Status  . Hemoglobin 12/23/2014 11.2* 12.0 - 16.0 g/dL Final  . HCT 16/10/960403/05/2015 34* 36 - 46 % Final  . Platelets 12/23/2014 179  150 - 399 K/L Final  . WBC 12/23/2014 4.8   Final  . Glucose 12/23/2014 90   Final  . BUN 12/23/2014 18  4 -  21 mg/dL Final  . Creatinine 54/09/811903/05/2015 0.7  0.5 - 1.1 mg/dL Final  . Potassium 14/78/295603/05/2015 4.3  3.4 - 5.3 mmol/L Final  . Sodium 12/23/2014 139  137 - 147 mmol/L Final  . Alkaline Phosphatase 12/23/2014 71  25 - 125 U/L Final  . ALT 12/23/2014 8  7 - 35 U/L Final  . AST 12/23/2014 14  13 - 35 U/L Final  . Bilirubin, Total 12/23/2014 0.5   Final  . TSH 12/23/2014 2.15  0.41 - 5.90 uIU/mL Final     Assessment/Plan 1. Aortic stenosis Observe  2. Essential hypertension Controlled  3. Hypothyroidism, unspecified hypothyroidism type Most recent lab normal TSH  4. Dyslipidemia Has not been checked in quite some time

## 2015-03-23 ENCOUNTER — Encounter: Payer: Self-pay | Admitting: Nurse Practitioner

## 2015-03-23 ENCOUNTER — Non-Acute Institutional Stay (SKILLED_NURSING_FACILITY): Payer: Medicare Other | Admitting: Nurse Practitioner

## 2015-03-23 DIAGNOSIS — I82402 Acute embolism and thrombosis of unspecified deep veins of left lower extremity: Secondary | ICD-10-CM

## 2015-03-23 DIAGNOSIS — E039 Hypothyroidism, unspecified: Secondary | ICD-10-CM | POA: Diagnosis not present

## 2015-03-23 DIAGNOSIS — J310 Chronic rhinitis: Secondary | ICD-10-CM | POA: Diagnosis not present

## 2015-03-23 DIAGNOSIS — H6980 Other specified disorders of Eustachian tube, unspecified ear: Secondary | ICD-10-CM | POA: Diagnosis not present

## 2015-03-23 DIAGNOSIS — R3911 Hesitancy of micturition: Secondary | ICD-10-CM

## 2015-03-23 DIAGNOSIS — G8929 Other chronic pain: Secondary | ICD-10-CM

## 2015-03-23 DIAGNOSIS — I639 Cerebral infarction, unspecified: Secondary | ICD-10-CM

## 2015-03-23 DIAGNOSIS — I10 Essential (primary) hypertension: Secondary | ICD-10-CM

## 2015-03-23 DIAGNOSIS — G319 Degenerative disease of nervous system, unspecified: Secondary | ICD-10-CM

## 2015-03-23 DIAGNOSIS — M545 Low back pain, unspecified: Secondary | ICD-10-CM

## 2015-03-23 DIAGNOSIS — K5900 Constipation, unspecified: Secondary | ICD-10-CM | POA: Diagnosis not present

## 2015-03-23 DIAGNOSIS — F418 Other specified anxiety disorders: Secondary | ICD-10-CM

## 2015-03-23 DIAGNOSIS — F419 Anxiety disorder, unspecified: Secondary | ICD-10-CM

## 2015-03-23 NOTE — Progress Notes (Signed)
Patient ID: [name], female   DOB: 1921/12/14, 81 y.o.   MRN: [id number] Patient ID: [name], female   DOB: 13-Aug-1922, 81 y.o.   MRN: [id number]   Code Status: DNR  Allergies  Allergen Reactions  . Augmentin [Amoxicillin-Pot Clavulanate] Nausea Only    Chief Complaint  Patient presents with  . Medical Management of Chronic Issues    HPI: Patient is a 81 y.o. female seen in the SNF at [facility] today for evaluation of chronic medical conditions.   CT head and cervical spine in ED revealed no acute injury. Posterior occipital hematoma is opened and healing slowly.  Problem List Items Addressed This Visit    None      Review of Systems:  Review of Systems  Constitutional: Negative for fever, chills, diaphoresis, activity change and appetite change.  HENT: Positive for hearing loss. Negative for congestion, ear discharge, ear pain, nosebleeds, sore throat and tinnitus.   Eyes: Negative for photophobia, pain, discharge, redness and itching.  Respiratory: Negative for cough, shortness of breath, wheezing and stridor.   Cardiovascular: Positive for leg swelling. Negative for chest pain and palpitations.       Trace only.   Gastrointestinal: Negative for nausea, vomiting, abdominal pain, diarrhea, constipation and blood in stool.  Endocrine: Negative for polydipsia.  Genitourinary: Positive for frequency. Negative for dysuria, urgency, hematuria and flank pain.  Musculoskeletal: Negative for myalgias, back pain and neck pain.  Skin: Negative for color change, pallor and rash.       Right posterior right occipital hematoma opened and healing slowly.   Allergic/Immunologic: Negative for environmental allergies.  Neurological: Negative for dizziness, tremors, seizures, weakness and headaches.  Hematological: Bruises/bleeds easily.  Psychiatric/Behavioral: Negative for suicidal ideas and hallucinations. The patient is nervous/anxious.      Past Medical  History  Diagnosis Date  . Deviated nasal septum   . Dysfunction of eustachian tube   . Unspecified sinusitis (chronic)   . Allergic rhinitis, cause unspecified   . Arthritis   . Cataract   . Senile osteoporosis   . Hx of syncope     had loop recorder for 2 years now explanted, only PACs noted on device for over 2 years, no a fib, Echo 08/18/09 EF >55%mild concentric LVHnormal myoview, normal carotid dopplers 2010  . Pruritus of skin     poss. related to Losartan  . Hyperlipidemia   . Normal cardiac stress test     lexiscan myoview 04/08/08  . CVA (cerebral infarction) 2015  . Cerebral atrophy 2015  . Cerebrovascular disease 2015  . Dislocation of hip prosthesis 2015  . Lumbago 2015  . Unspecified hypothyroidism 2015  . Anxiety 2015  . Hyponatremia 2015  . Cervical spine degeneration 2015  . Acute DVT (deep venous thrombosis) 06/09/2014    06/06/14 Left leg 07/01/14 Urology removed Estrogen ring.    Marland Kitchen Blurred vision, left eye 05/19/2014  . Dyslipidemia   . HTN (hypertension) 09/17/2014    09/16/14 Neurology: Lisinopril 10mg  daily    . Hypothyroidism 05/12/2014    05/20/14 TSH 0.245 07/01/14 TSH 2.366 12/23/14 TSH 2.153   . Left femoral vein DVT 06/09/2014    06/06/14 Duplex Venous US LLE: deep venous thrombosis at left common femoral and mid to proximal superficial femoral veins.       . Mixed anxiety depressive disorder 05/12/2014  . Urinary hesitancy 06/30/2014    06/30/14 US bladder: no significant abnormality evident, but initially only partially distended urinary  bladder limiting evaluation. Post void residual 29ml.  09/26/14 urine culture no growth 09/30/14 Urology: Leatrice JewelsLuvena vaginal Gel 3x/wk. Myrbetriq 25mg  daily.  02/05/15 urine culture no growth   . UTI (urinary tract infection) 09/08/2014    09/08/14 urine culture E-coli, Nitrofurantoin 100mg  bid x 7 days.  02/05/15 urine culture no growth    Past Surgical History  Procedure Laterality Date  . Tonsillectomy    . Total hip arthroplasty       bilateral  . Pelvic sling    . Joint replacement    . Loop recorder implant  07/2009    to eval syncope  . Tibial tuberclerplasty  01/12/2012  . Loop recorder explant N/A 01/12/2012    Procedure: LOOP RECORDER EXPLANT;  Surgeon: Thurmon FairMihai Croitoru, MD;  Location: [facility] CATH LAB;  Service: Cardiovascular;  Laterality: N/A;   Social History:   reports that she has never smoked. She has never used smokeless tobacco. She reports that she does not drink alcohol or use illicit drugs.  Family History  Problem Relation Age of Onset  . Cancer Father     Medications: Patient's Medications  New Prescriptions   No medications on file  Previous Medications   APIXABAN (ELIQUIS) 5 MG TABS TABLET    Take 5 mg by mouth 2 (two) times daily.   ATORVASTATIN (LIPITOR) 10 MG TABLET    Take 10 mg by mouth daily at 6 PM.    CETIRIZINE (ZYRTEC) 10 MG TABLET    Take 10 mg by mouth daily as needed for allergies.    GABAPENTIN (NEURONTIN) 100 MG CAPSULE       GUAIFENESIN (MUCINEX) 600 MG 12 HR TABLET    Take 600 mg by mouth 2 (two) times daily as needed for cough.   HYDROCODONE-ACETAMINOPHEN (NORCO/VICODIN) 5-325 MG PER TABLET    Take one tablet by mouth three times daily at 8am,2pm and 8pm for chronic low back pain   LEVOTHYROXINE (SYNTHROID, LEVOTHROID) 50 MCG TABLET    Take 50 mcg by mouth daily before breakfast.   LISINOPRIL (PRINIVIL,ZESTRIL) 10 MG TABLET    Take 10 mg by mouth daily.   LORAZEPAM (ATIVAN) 0.5 MG TABLET    Take one tablet by mouth twice daily at 8am and 10pm for anxiety   MECLIZINE (ANTIVERT) 25 MG TABLET       MIRABEGRON ER (MYRBETRIQ) 25 MG TB24 TABLET    Take 25 mg by mouth daily.   MONTELUKAST (SINGULAIR) 10 MG TABLET    Take 10 mg by mouth at bedtime.   POLYETHYL GLYCOL-PROPYL GLYCOL (SYSTANE) 0.4-0.3 % SOLN    Place 1 drop into both eyes 2 (two) times daily as needed (for dry eyes).    POLYETHYLENE GLYCOL (MIRALAX / GLYCOLAX) PACKET    Take 17 g by mouth 2 (two) times daily.    POLYETHYLENE GLYCOL POWDER (GLYCOLAX/MIRALAX) POWDER       SERTRALINE (ZOLOFT) 25 MG TABLET    Take 25 mg by mouth daily.   Modified Medications   No medications on file  Discontinued Medications   No medications on file     Physical Exam: Physical Exam  Constitutional: She is oriented to person, place, and time. She appears well-developed and well-nourished. No distress.  HENT:  Head: Normocephalic and atraumatic.  Right Ear: External ear normal.  Left Ear: External ear normal.  Nose: Nose normal.  Mouth/Throat: Oropharynx is clear and moist. No oropharyngeal exudate.  Eyes: Conjunctivae and EOM are normal. Pupils are equal, round, and reactive  to light. Right eye exhibits no discharge. Left eye exhibits no discharge. No scleral icterus.  Neck: Normal range of motion. Neck supple. No JVD present. No tracheal deviation present. No thyromegaly present.  Cardiovascular: Normal rate and regular rhythm.  Exam reveals no gallop and no friction rub.   Murmur heard. 4/6  Pulmonary/Chest: Effort normal and breath sounds normal. No stridor. No respiratory distress. She has no wheezes. She has no rales. She exhibits no tenderness.  Abdominal: Soft. Bowel sounds are normal. She exhibits no distension and no mass. There is no tenderness. There is no rebound and no guarding.  Musculoskeletal: Normal range of motion. She exhibits edema. She exhibits no tenderness.  Chronic back pain. LLE swelling 1+ and aches   Lymphadenopathy:    She has no cervical adenopathy.  Neurological: She is alert and oriented to person, place, and time. She has normal reflexes. No cranial nerve deficit. She exhibits normal muscle tone. Coordination abnormal.  Slight weakness LUE and LLE  Skin: Skin is warm and dry. No rash noted. She is not diaphoretic. No erythema. No pallor.  Right posterior right occipital hematoma opened and healing slowly.    Psychiatric: She has a normal mood and affect. Her behavior is normal.  Judgment and thought content normal.    Filed Vitals:   03/23/15 1637  BP: 124/72  Pulse: 64  Temp: 98.2 F (36.8 C)  TempSrc: Tympanic  Resp: 20      Labs reviewed: Basic Metabolic Panel:  Recent Labs  04/54/09 1720 05/07/14 2046  05/09/14 0443  05/20/14 06/26/14 07/01/14 09/25/14 12/23/14  NA 136* 134*  --  138  < > 139 130*  --  140 139  K 4.4 4.2  --  4.0  < > 4.2 3.9  --  4.4 4.3  CL 97 102  --  102  --   --   --   --   --   --   CO2 23  --   --  23  --   --   --   --   --   --   GLUCOSE 98 107*  --  100*  --   --   --   --   --   --   BUN 13 14  --  12  < > 12 10  --  13 18  CREATININE 0.65 0.60  --  0.68  < > 0.7 0.6  --  0.8 0.7  CALCIUM 9.2  --   --  8.6  --   --   --   --   --   --   TSH  --   --   < >  --   --  0.24*  --  2.34  --  2.15  < > = values in this interval not displayed. Liver Function Tests:  Recent Labs  09/25/14 12/23/14  AST 27 14  ALT 21 8  ALKPHOS 126* 71   No results for input(s): LIPASE, AMYLASE in the last 8760 hours. No results for input(s): AMMONIA in the last 8760 hours. CBC:  Recent Labs  05/06/14 0025  05/07/14 1720  05/09/14 0443  06/26/14 09/25/14 12/23/14  WBC 5.5  --  5.4  --  5.1  < > 4.3 4.0 4.8  HGB 13.3  < > 14.4  < > 12.4  < > 11.5* 11.2* 11.2*  HCT 39.0  < > 43.4  < > 37.6  < > 35* 35*  34*  MCV 87.1  --  90.2  --  88.5  --   --   --   --   PLT 224  --  256  --  220  < > 268 163 179  < > = values in this interval not displayed. Lipid Panel:  Recent Labs  05/08/14 0540  CHOL 198  HDL 55  LDLCALC 126*  TRIG 86  CHOLHDL 3.6    Past Procedures:  12/19/14 CT head and cervical spine w/o contrast:  IMPRESSION: 1. No evidence of acute intracranial or cervical spine injury. 2. Right posterior scalp hematoma without fracture. 3. Chronic findings are stable from prior and noted above.  Assessment/Plan No problem-specific assessment & plan notes found for this encounter.   Family/ Staff Communication:  observe the patient  Goals of Care: SNF  Labs/tests ordered: Cath UA C/S in am

## 2015-03-24 NOTE — Assessment & Plan Note (Signed)
05/08/14 MRI brain:  IMPRESSION:  1. Acute, moderately large right PCA territory infarct. No  hemorrhage.  2. Fetal type origins of the PCAs with occlusion of the proximal P2  segment on the right.  3. Intracranial atherosclerosis with severe stenoses of the proximal  basilar artery and left P2 segment and mild to moderate stenoses of  the distal left vertebral artery, left P2 segment, and left MCA  Bifurcation.  Discontinue estrogen vaginal ring.   07/02/14 c/o "foggy head"-f/u Neurology  09/16/14 Neurology Neurontin 100mg  tid for left arm and leg numbness.   Continue Eliquis 5mg  bid

## 2015-03-24 NOTE — Assessment & Plan Note (Signed)
Urology: Leatrice JewelsLuvena vaginal Gel 3x/wk. Myrbetriq 25mg  daily.

## 2015-03-24 NOTE — Assessment & Plan Note (Signed)
Left leg swelling, aches, feels heavy 06/06/14. 06/06/14 Duplex Venous US LLE: deep venous thrombosis at left common femoral and mid to proximal superficial femoral veins. Continue Eliqui 5mg  bid  07/01/14 Urology removed Estrogen ring 08/27/14 less edematous LLE --trace edema LLE>RLL since onset of the DVT.

## 2015-03-24 NOTE — Assessment & Plan Note (Signed)
12/03/14 dc prn Antivert due to lack of use.

## 2015-03-24 NOTE — Progress Notes (Addendum)
Patient ID: [name], female   DOB: [birth year], 81 y.o.   MRN: [id number]   Code Status: DNR  Allergies  Allergen Reactions  . Augmentin [Amoxicillin-Pot Clavulanate] Nausea Only    Chief Complaint  Patient presents with  . Medical Management of Chronic Issues    HPI: Patient is a 81 y.o. female seen in the SNF at [facility] today for evaluation of cough and chronic medical conditions.   CT head and cervical spine in ED revealed no acute injury. Posterior occipital hematoma is opened and healing slowly.  Problem List Items Addressed This Visit    EUSTACHIAN TUBE DYSFUNCTION    12/03/14 dc prn Antivert due to lack of use.       Nonallergic rhinitis - Primary    Allergy profile 05/24/2011-negative, total IgE 4.7 03/23/15 c/o chronic cough, prn Robitussin DM 10mg  q6h       Chronic low back pain    Not new. Pain is managed with Hydrocodone/ApAp 5/325mg  tid and q6h prn.         CVA (cerebral infarction)    05/08/14 MRI brain:  IMPRESSION:  1. Acute, moderately large right PCA territory infarct. No  hemorrhage.  2. Fetal type origins of the PCAs with occlusion of the proximal P2  segment on the right.  3. Intracranial atherosclerosis with severe stenoses of the proximal  basilar artery and left P2 segment and mild to moderate stenoses of  the distal left vertebral artery, left P2 segment, and left MCA  Bifurcation.  Discontinue estrogen vaginal ring.   07/02/14 c/o "foggy head"-f/u Neurology  09/16/14 Neurology Neurontin 100mg  tid for left arm and leg numbness.   Continue Eliquis 5mg  bid        Hypothyroidism    05/20/14 TSH 0.245 07/01/14 TSH 2.366 12/23/14 TSH 2.153 Continue Levothyroxine        Mixed anxiety depressive disorder    The patient admitted she is depressed and desires antidepressant-started Sertraline 25mg  daily 06/26/14. No longer c/o "foggy head" will eliminate possible AR of medications: hold Zytec x 1wk-will change Zyrtec to  prn.  --stable, takes Ativan 0.5mg  bid, Sertraline 25mg  qd       Cerebral atrophy    02/26/15 MMSE 25/30       Anxiety    The patient admitted she is depressed and desires antidepressant-started Sertraline 25mg  daily 06/26/14. No longer c/o "foggy head" will eliminate possible AR of medications: hold Zytec x 1wk-will change Zyrtec to prn.  --stable, takes Ativan 0.5mg  bid, Sertraline 25mg  qd. 512/16 MMSE 25/30        Constipation    Controlled, takes MiraLax 17gm +4 Oz water po qd 12/18/14        Acute DVT (deep venous thrombosis)    Left leg swelling, aches, feels heavy 06/06/14. 06/06/14 Duplex Venous US LLE: deep venous thrombosis at left common femoral and mid to proximal superficial femoral veins. Continue Eliqui 5mg  bid  07/01/14 Urology removed Estrogen ring 08/27/14 less edematous LLE --trace edema LLE>RLL since onset of the DVT.        Urinary hesitancy    Urology: [name] vaginal Gel 3x/wk. Myrbetriq 25mg  daily.        HTN (hypertension)    09/16/14 Neurology: Lisinopril 10mg  daily  -- controlled. Also takes Atorvastatin for cardiovascular risk reduction.             Review of Systems:  Review of Systems  Constitutional: Negative for fever, chills, diaphoresis, activity change and appetite  change.  HENT: Positive for hearing loss. Negative for congestion, ear discharge, ear pain, nosebleeds, sore throat and tinnitus.   Eyes: Negative for photophobia, pain, discharge, redness and itching.  Respiratory: Positive for cough. Negative for shortness of breath, wheezing and stridor.   Cardiovascular: Positive for leg swelling. Negative for chest pain and palpitations.       Trace only.   Gastrointestinal: Negative for nausea, vomiting, abdominal pain, diarrhea, constipation and blood in stool.  Endocrine: Negative for polydipsia.  Genitourinary: Positive for frequency. Negative for dysuria, urgency, hematuria and flank pain.  Musculoskeletal: Negative for myalgias,  back pain and neck pain.  Skin: Negative for color change, pallor and rash.       Right posterior right occipital hematoma opened and healing slowly.   Allergic/Immunologic: Negative for environmental allergies.  Neurological: Negative for dizziness, tremors, seizures, weakness and headaches.  Hematological: Bruises/bleeds easily.  Psychiatric/Behavioral: Negative for suicidal ideas and hallucinations. The patient is nervous/anxious.      Past Medical History  Diagnosis Date  . Deviated nasal septum   . Dysfunction of eustachian tube   . Unspecified sinusitis (chronic)   . Allergic rhinitis, cause unspecified   . Arthritis   . Cataract   . Senile osteoporosis   . Hx of syncope     had loop recorder for 2 years now explanted, only PACs noted on device for over 2 years, no a fib, Echo 08/18/09 EF >55%mild concentric LVHnormal myoview, normal carotid dopplers 2010  . Pruritus of skin     poss. related to Losartan  . Hyperlipidemia   . Normal cardiac stress test     lexiscan myoview 04/08/08  . CVA (cerebral infarction) 2015  . Cerebral atrophy 2015  . Cerebrovascular disease 2015  . Dislocation of hip prosthesis 2015  . Lumbago 2015  . Unspecified hypothyroidism 2015  . Anxiety 2015  . Hyponatremia 2015  . Cervical spine degeneration 2015  . Acute DVT (deep venous thrombosis) 06/09/2014    06/06/14 Left leg 07/01/14 Urology removed Estrogen ring.    Marland Kitchen Blurred vision, left eye 05/19/2014  . Dyslipidemia   . HTN (hypertension) 09/17/2014    09/16/14 Neurology: Lisinopril  daily    . Hypothyroidism 05/12/2014    05/20/14 TSH 0.245 07/01/14 TSH 2.366 12/23/14 TSH 2.153   . Left femoral vein DVT 06/09/2014    06/06/14 Duplex Venous US LLE: deep venous thrombosis at left common femoral and mid to proximal superficial femoral veins.       . Mixed anxiety depressive disorder 05/12/2014  . Urinary hesitancy 06/30/2014    06/30/14 US bladder: no significant abnormality evident, but initially only  partially distended urinary bladder limiting evaluation. Post void residual 29ml.  09/26/14 urine culture no growth 09/30/14 Urology: [name] vaginal Gel 3x/wk. Myrbetriq  daily.  02/05/15 urine culture no growth   . UTI (urinary tract infection) 09/08/2014    09/08/14 urine culture E-coli, Nitrofurantoin  bid x 7 days.  02/05/15 urine culture no growth    Past Surgical History  Procedure Laterality Date  . Tonsillectomy    . Total hip arthroplasty      bilateral  . Pelvic sling    . Joint replacement    . Loop recorder implant  07/2009    to eval syncope  . Tibial tuberclerplasty  01/12/2012  . Loop recorder explant N/A 01/12/2012    Procedure: LOOP RECORDER EXPLANT;  Surgeon: Thurmon Fair, MD;  Location: [facility] CATH [facility];  Service: Cardiovascular;  Laterality: N/A;  Social History:   reports that she has never smoked. She has never used smokeless tobacco. She reports that she does not drink alcohol or use illicit drugs.  Family History  Problem Relation Age of Onset  . Cancer Father     Medications: Patient's Medications  New Prescriptions   No medications on file  Previous Medications   APIXABAN (ELIQUIS) 5 MG TABS TABLET    Take 5 mg by mouth 2 (two) times daily.   ATORVASTATIN (LIPITOR) 10 MG TABLET    Take 10 mg by mouth daily at 6 PM.    CETIRIZINE (ZYRTEC) 10 MG TABLET    Take 10 mg by mouth daily as needed for allergies.    GABAPENTIN (NEURONTIN) 100 MG CAPSULE       GUAIFENESIN (MUCINEX) 600 MG 12 HR TABLET    Take 600 mg by mouth 2 (two) times daily as needed for cough.   HYDROCODONE-ACETAMINOPHEN (NORCO/VICODIN) 5-325 MG PER TABLET    Take one tablet by mouth three times daily at 8am,2pm and 8pm for chronic low back pain   LEVOTHYROXINE (SYNTHROID, LEVOTHROID) 50 MCG TABLET    Take 50 mcg by mouth daily before breakfast.   LISINOPRIL (PRINIVIL,ZESTRIL) 10 MG TABLET    Take 10 mg by mouth daily.   LORAZEPAM (ATIVAN) 0.5 MG TABLET    Take one tablet by mouth twice  daily at 8am and 10pm for anxiety   MECLIZINE (ANTIVERT) 25 MG TABLET       MIRABEGRON ER (MYRBETRIQ) 25 MG TB24 TABLET    Take 25 mg by mouth daily.   MONTELUKAST (SINGULAIR) 10 MG TABLET    Take 10 mg by mouth at bedtime.   POLYETHYL GLYCOL-PROPYL GLYCOL (SYSTANE) 0.4-0.3 % SOLN    Place 1 drop into both eyes 2 (two) times daily as needed (for dry eyes).    POLYETHYLENE GLYCOL (MIRALAX / GLYCOLAX) PACKET    Take 17 g by mouth 2 (two) times daily.   POLYETHYLENE GLYCOL POWDER (GLYCOLAX/MIRALAX) POWDER       SERTRALINE (ZOLOFT) 25 MG TABLET    Take 25 mg by mouth daily.   Modified Medications   No medications on file  Discontinued Medications   No medications on file     Physical Exam: Physical Exam  Constitutional: She is oriented to person, place, and time. She appears well-developed and well-nourished. No distress.  HENT:  Head: Normocephalic and atraumatic.  Right Ear: External ear normal.  Left Ear: External ear normal.  Nose: Nose normal.  Mouth/Throat: Oropharynx is clear and moist. No oropharyngeal exudate.  Eyes: Conjunctivae and EOM are normal. Pupils are equal, round, and reactive to light. Right eye exhibits no discharge. Left eye exhibits no discharge. No scleral icterus.  Neck: Normal range of motion. Neck supple. No JVD present. No tracheal deviation present. No thyromegaly present.  Cardiovascular: Normal rate and regular rhythm.  Exam reveals no gallop and no friction rub.   Murmur heard. 4/6  Pulmonary/Chest: Effort normal and breath sounds normal. No stridor. No respiratory distress. She has no wheezes. She has no rales. She exhibits no tenderness.  Abdominal: Soft. Bowel sounds are normal. She exhibits no distension and no mass. There is no tenderness. There is no rebound and no guarding.  Musculoskeletal: Normal range of motion. She exhibits edema. She exhibits no tenderness.  Chronic back pain. LLE swelling 1+ and aches   Lymphadenopathy:    She has no cervical  adenopathy.  Neurological: She is alert and oriented  to person, place, and time. She has normal reflexes. No cranial nerve deficit. She exhibits normal muscle tone. Coordination abnormal.  Slight weakness LUE and LLE  Skin: Skin is warm and dry. No rash noted. She is not diaphoretic. No erythema. No pallor.  Right posterior right occipital hematoma opened and healing slowly.    Psychiatric: She has a normal mood and affect. Her behavior is normal. Judgment and thought content normal.    Filed Vitals:   03/23/15 1637  BP: 124/72  Pulse: 64  Temp: 98.2 F (36.8 C)  TempSrc: Tympanic  Resp: 20      Labs reviewed: Basic Metabolic Panel:  Recent Labs  09/81/19 1720 05/07/14 2046  05/09/14 0443  05/20/14 06/26/14 07/01/14 09/25/14 12/23/14  NA 136* 134*  --  138  < > 139 130*  --  140 139  K 4.4 4.2  --  4.0  < > 4.2 3.9  --  4.4 4.3  CL 97 102  --  102  --   --   --   --   --   --   CO2 23  --   --  23  --   --   --   --   --   --   GLUCOSE 98 107*  --  100*  --   --   --   --   --   --   BUN 13 14  --  12  < > 12 10  --  13 18  CREATININE 0.65 0.60  --  0.68  < > 0.7 0.6  --  0.8 0.7  CALCIUM 9.2  --   --  8.6  --   --   --   --   --   --   TSH  --   --   < >  --   --  0.24*  --  2.34  --  2.15  < > = values in this interval not displayed. Liver Function Tests:  Recent Labs  09/25/14 12/23/14  AST 27 14  ALT 21 8  ALKPHOS 126* 71   No results for input(s): LIPASE, AMYLASE in the last 8760 hours. No results for input(s): AMMONIA in the last 8760 hours. CBC:  Recent Labs  05/06/14 0025  05/07/14 1720  05/09/14 0443  06/26/14 09/25/14 12/23/14  WBC 5.5  --  5.4  --  5.1  < > 4.3 4.0 4.8  HGB 13.3  < > 14.4  < > 12.4  < > 11.5* 11.2* 11.2*  HCT 39.0  < > 43.4  < > 37.6  < > 35* 35* 34*  MCV 87.1  --  90.2  --  88.5  --   --   --   --   PLT 224  --  256  --  220  < > 268 163 179  < > = values in this interval not displayed. Lipid Panel:  Recent Labs   05/08/14 0540  CHOL 198  HDL 55  LDLCALC 126*  TRIG 86  CHOLHDL 3.6    Past Procedures:  12/19/14 CT head and cervical spine w/o contrast:  IMPRESSION: 1. No evidence of acute intracranial or cervical spine injury. 2. Right posterior scalp hematoma without fracture. 3. Chronic findings are stable from prior and noted above.  Assessment/Plan Acute DVT (deep venous thrombosis) Left leg swelling, aches, feels heavy 06/06/14. 06/06/14 Duplex Venous US LLE: deep venous thrombosis at left common femoral and  mid to proximal superficial femoral veins. Continue Eliqui  bid  07/01/14 Urology removed Estrogen ring 08/27/14 less edematous LLE --trace edema LLE>RLL since onset of the DVT.     Anxiety The patient admitted she is depressed and desires antidepressant-started Sertraline  daily 06/26/14. No longer c/o "foggy head" will eliminate possible AR of medications: hold Zytec x 1wk-will change Zyrtec to prn.  --stable, takes Ativan 0.5mg  bid, Sertraline  qd. 512/16 MMSE 25/30     Cerebral atrophy 02/26/15 MMSE 25/30    Chronic low back pain Not new. Pain is managed with Hydrocodone/ApAp 5/325mg  tid and q6h prn.      Constipation Controlled, takes MiraLax 17gm +4 Oz water po qd 12/18/14     CVA (cerebral infarction) 05/08/14 MRI brain:  IMPRESSION:  1. Acute, moderately large right PCA territory infarct. No  hemorrhage.  2. Fetal type origins of the PCAs with occlusion of the proximal P2  segment on the right.  3. Intracranial atherosclerosis with severe stenoses of the proximal  basilar artery and left P2 segment and mild to moderate stenoses of  the distal left vertebral artery, left P2 segment, and left MCA  Bifurcation.  Discontinue estrogen vaginal ring.   07/02/14 c/o "foggy head"-f/u Neurology  09/16/14 Neurology Neurontin  tid for left arm and leg numbness.   Continue Eliquis  bid     EUSTACHIAN TUBE DYSFUNCTION 12/03/14 dc prn Antivert  due to lack of use.    HTN (hypertension) 09/16/14 Neurology: Lisinopril  daily  -- controlled. Also takes Atorvastatin for cardiovascular risk reduction.       Hypothyroidism 05/20/14 TSH 0.245 07/01/14 TSH 2.366 12/23/14 TSH 2.153 Continue Levothyroxine     Mixed anxiety depressive disorder The patient admitted she is depressed and desires antidepressant-started Sertraline  daily 06/26/14. No longer c/o "foggy head" will eliminate possible AR of medications: hold Zytec x 1wk-will change Zyrtec to prn.  --stable, takes Ativan 0.5mg  bid, Sertraline  qd    Nonallergic rhinitis Allergy profile 05/24/2011-negative, total IgE 4.7 03/23/15 c/o chronic cough, prn Robitussin DM  q6h    Urinary hesitancy Urology: Luvena vaginal Gel 3x/wk. Myrbetriq  daily.       Family/ Staff Communication: observe the patient  Goals of Care: SNF  Labs/tests ordered: none

## 2015-03-24 NOTE — Assessment & Plan Note (Signed)
Controlled, takes MiraLax 17gm +4 Oz water po qd 12/18/14

## 2015-03-24 NOTE — Assessment & Plan Note (Signed)
The patient admitted she is depressed and desires antidepressant-started Sertraline 25mg  daily 06/26/14. No longer c/o "foggy head" will eliminate possible AR of medications: hold Zytec x 1wk-will change Zyrtec to prn.  --stable, takes Ativan 0.5mg  bid, Sertraline 25mg  qd

## 2015-03-24 NOTE — Assessment & Plan Note (Signed)
Not new. Pain is managed with Hydrocodone/ApAp 5/325mg  tid and q6h prn.

## 2015-03-24 NOTE — Assessment & Plan Note (Signed)
05/20/14 TSH 0.245 07/01/14 TSH 2.366 12/23/14 TSH 2.153 Continue Levothyroxine 50mcg

## 2015-03-24 NOTE — Assessment & Plan Note (Signed)
09/16/14 Neurology: Lisinopril 10mg  daily  -- controlled. Also takes Atorvastatin for cardiovascular risk reduction.

## 2015-03-24 NOTE — Assessment & Plan Note (Signed)
The patient admitted she is depressed and desires antidepressant-started Sertraline 25mg  daily 06/26/14. No longer c/o "foggy head" will eliminate possible AR of medications: hold Zytec x 1wk-will change Zyrtec to prn.  --stable, takes Ativan 0.5mg  bid, Sertraline 25mg  qd. 512/16 MMSE 25/30

## 2015-03-24 NOTE — Assessment & Plan Note (Signed)
Allergy profile 05/24/2011-negative, total IgE 4.7 03/23/15 c/o chronic cough, prn Robitussin DM 10mg  q6h

## 2015-03-24 NOTE — Assessment & Plan Note (Signed)
02/26/15 MMSE 25/30

## 2015-04-13 ENCOUNTER — Other Ambulatory Visit: Payer: Self-pay | Admitting: *Deleted

## 2015-04-13 MED ORDER — HYDROCODONE-ACETAMINOPHEN 5-325 MG PO TABS: ORAL_TABLET | ORAL | Status: AC

## 2015-04-13 NOTE — Telephone Encounter (Signed)
[facility] 

## 2015-05-13 ENCOUNTER — Non-Acute Institutional Stay (SKILLED_NURSING_FACILITY): Payer: Medicare Other | Admitting: Adult Health

## 2015-05-13 DIAGNOSIS — J310 Chronic rhinitis: Secondary | ICD-10-CM

## 2015-05-13 DIAGNOSIS — M545 Low back pain: Secondary | ICD-10-CM | POA: Diagnosis not present

## 2015-05-13 DIAGNOSIS — E785 Hyperlipidemia, unspecified: Secondary | ICD-10-CM

## 2015-05-13 DIAGNOSIS — G8929 Other chronic pain: Secondary | ICD-10-CM

## 2015-05-13 DIAGNOSIS — E039 Hypothyroidism, unspecified: Secondary | ICD-10-CM

## 2015-05-13 DIAGNOSIS — F418 Other specified anxiety disorders: Secondary | ICD-10-CM | POA: Diagnosis not present

## 2015-05-13 DIAGNOSIS — I82412 Acute embolism and thrombosis of left femoral vein: Secondary | ICD-10-CM | POA: Diagnosis not present

## 2015-05-13 DIAGNOSIS — K5900 Constipation, unspecified: Secondary | ICD-10-CM

## 2015-05-28 ENCOUNTER — Encounter: Payer: Self-pay | Admitting: Adult Health

## 2015-05-28 NOTE — Progress Notes (Signed)
Patient ID: [name], female   DOB: [birth year], 81 y.o.   MRN: [id number]    Facility: [facility]       Allergies  Allergen Reactions  . Augmentin [Amoxicillin-Pot Clavulanate] Nausea Only    Chief Complaint  Patient presents with  . Medical Management of Chronic Issues    HPI:  She is a long term resident of this facility being seen for the management of her chronic illnesses. She is complaining of sinus congestion when she eats.she states that the congestion interferes with her eating. She has not noted any coughing while eating or choking feeling. We discussed that one cause of the nasal congestion and drainage is aspiration. She states that she has seen speech in the past and is not interested in that coarse of action at this time. We did discuss medication options to help with her congestion.    Past Medical History  Diagnosis Date  . Deviated nasal septum   . Dysfunction of eustachian tube   . Unspecified sinusitis (chronic)   . Allergic rhinitis, cause unspecified   . Arthritis   . Cataract   . Senile osteoporosis   . Hx of syncope     had loop recorder for 2 years now explanted, only PACs noted on device for over 2 years, no a fib, Echo 08/18/09 EF >55%mild concentric LVHnormal myoview, normal carotid dopplers 2010  . Pruritus of skin     poss. related to Losartan  . Hyperlipidemia   . Normal cardiac stress test     lexiscan myoview 04/08/08  . CVA (cerebral infarction) 2015  . Cerebral atrophy 2015  . Cerebrovascular disease 2015  . Dislocation of hip prosthesis 2015  . Lumbago 2015  . Unspecified hypothyroidism 2015  . Anxiety 2015  . Hyponatremia 2015  . Cervical spine degeneration 2015  . Acute DVT (deep venous thrombosis) 06/09/2014    06/06/14 Left leg 07/01/14 Urology removed Estrogen ring.    Marland Kitchen Blurred vision, left eye 05/19/2014  . Dyslipidemia   . HTN (hypertension) 09/17/2014    09/16/14 Neurology: Lisinopril  daily    .  Hypothyroidism 05/12/2014    05/20/14 TSH 0.245 07/01/14 TSH 2.366 12/23/14 TSH 2.153   . Left femoral vein DVT 06/09/2014    06/06/14 Duplex Venous US LLE: deep venous thrombosis at left common femoral and mid to proximal superficial femoral veins.       . Mixed anxiety depressive disorder 05/12/2014  . Urinary hesitancy 06/30/2014    06/30/14 US bladder: no significant abnormality evident, but initially only partially distended urinary bladder limiting evaluation. Post void residual 29ml.  09/26/14 urine culture no growth 09/30/14 Urology: Leatrice Jewels vaginal Gel 3x/wk. Myrbetriq  daily.  02/05/15 urine culture no growth   . UTI (urinary tract infection) 09/08/2014    09/08/14 urine culture E-coli, Nitrofurantoin  bid x 7 days.  02/05/15 urine culture no growth     Past Surgical History  Procedure Laterality Date  . Tonsillectomy    . Total hip arthroplasty      bilateral  . Pelvic sling    . Joint replacement    . Loop recorder implant  07/2009    to eval syncope  . Tibial tuberclerplasty  01/12/2012  . Loop recorder explant N/A 01/12/2012    Procedure: LOOP RECORDER EXPLANT;  Surgeon: Thurmon Fair, MD;  Location: [facility] CATH LAB;  Service: Cardiovascular;  Laterality: N/A;    VITAL SIGNS BP 124/72 mmHg  Pulse 64  Ht 5'  4" (1.626 m)  Wt 144 lb 1.6 oz (65.363 kg)  BMI 24.72 kg/m2  Patient's Medications  New Prescriptions   No medications on file  Previous Medications   APIXABAN (ELIQUIS) 5 MG TABS TABLET    Take 5 mg by mouth 2 (two) times daily.   ATORVASTATIN (LIPITOR) 10 MG TABLET    Take 10 mg by mouth daily at 6 PM.    CETIRIZINE (ZYRTEC) 10 MG TABLET    Take 10 mg by mouth daily as needed for allergies.    GABAPENTIN (NEURONTIN) 100 MG CAPSULE    Take 100 mg by mouth 3 (three) times daily.    GUAIFENESIN (MUCINEX) 600 MG 12 HR TABLET    Take 600 mg by mouth 2 (two) times daily as needed for cough.   HYDROCODONE-ACETAMINOPHEN (NORCO/VICODIN) 5-325 MG PER TABLET    Take one tablet  by mouth three times daily at 8am,2pm and 8pm for chronic low back pain   LEVOTHYROXINE (SYNTHROID, LEVOTHROID) 50 MCG TABLET    Take 50 mcg by mouth daily before breakfast.   LISINOPRIL (PRINIVIL,ZESTRIL) 10 MG TABLET    Take 10 mg by mouth daily.   LORAZEPAM (ATIVAN) 0.5 MG TABLET    Take one tablet by mouth twice daily at 8am and 10pm for anxiety   MIRABEGRON ER (MYRBETRIQ) 25 MG TB24 TABLET    Take 25 mg by mouth daily.   MONTELUKAST (SINGULAIR) 10 MG TABLET    Take 10 mg by mouth at bedtime.   POLYETHYL GLYCOL-PROPYL GLYCOL (SYSTANE) 0.4-0.3 % SOLN    Place 1 drop into both eyes 2 (two) times daily as needed (for dry eyes).    POLYETHYLENE GLYCOL (MIRALAX / GLYCOLAX) PACKET    Take 17 g by mouth daily.    SERTRALINE (ZOLOFT) 25 MG TABLET    Take 25 mg by mouth daily.   Modified Medications   No medications on file  Discontinued Medications   MECLIZINE (ANTIVERT) 25 MG TABLET       POLYETHYLENE GLYCOL POWDER (GLYCOLAX/MIRALAX) POWDER         SIGNIFICANT DIAGNOSTIC EXAMS  LABS REVIEWED:  12-23-14: wbc 4.8; hgb 11.2; hct 33.8; mcv 91.6; plt 179; glucose 90; bun 18; creat 0.67; k+4.3; na++139; liver normal albumin 3.5; tsh 2.153  02-05-15; urine culture: no growth    Review of Systems  Constitutional: Negative for appetite change and fatigue.  HENT: Positive for congestion.        Has increased nasal drainage and congestion during meal time.   Respiratory: Negative for cough, chest tightness and shortness of breath.   Cardiovascular: Negative for chest pain, palpitations and leg swelling.  Gastrointestinal: Negative for nausea, abdominal pain, diarrhea and constipation.  Musculoskeletal: Negative for myalgias and arthralgias.  Skin: Negative for pallor.  Neurological: Negative for dizziness.  Psychiatric/Behavioral: The patient is not nervous/anxious.       Physical Exam  Constitutional: No distress.  Thin   HENT:  Nose: Nose normal.  Mouth/Throat: No oropharyngeal exudate.    Eyes: Conjunctivae are normal.  Neck: Neck supple. No JVD present. No thyromegaly present.  Cardiovascular: Normal rate, regular rhythm and intact distal pulses.   Respiratory: Effort normal and breath sounds normal. No respiratory distress. She has no wheezes.  GI: Soft. Bowel sounds are normal. She exhibits no distension. There is no tenderness.  Musculoskeletal: She exhibits no edema.  Able to move all extremities   Lymphadenopathy:    She has no cervical adenopathy.  Neurological: She is  alert.  Skin: Skin is warm and dry. She is not diaphoretic.  Psychiatric: She has a normal mood and affect.       ASSESSMENT/ PLAN:  1. Left femoral dvt: is presently stable will continue eliquis 5 mg twice daily and will monitor  2. Urinary incontinence: will continue myrbetriq 25 mg daily   3. Hypothyroidism: will continue synthroid 50 mcg daily tsh is 2.153  4. Hypertension: is presently stable will continue lisinopril 10 mg dial;   5. Dyslipidemia: will continue lipitor 10 mg daily will check lipids  6. Allergic rhinitis: will continue zyrtec 10 mg daily and singulair 10 mg daily; will begin atrovent nasal spray prior to meals to help excessive nasal drainage at meal time.  7. Constipation: will continue miralax daily  8. Chronic low back pain; will continue neurontin 100 mg three times daily; will continue vicodin 5/325 mg three times daily; and every 6 hours as needed   9. Depression with anxiety: will continue zoloft 25 mg daily and ativan 0.5 mg twice daily for anxiety    Time spent with patient  40  minutes >50% time spent counseling; reviewing medical record; tests; labs; and developing future plan of care     Synthia Innocent NP [facility]  Contact [phone] Monday through Friday 8am- 5pm  After hours call [phone]

## 2019-09-17 DEATH — deceased
